# Patient Record
Sex: Female | Born: 1945 | Race: White | Hispanic: No | Marital: Married | Smoking: Never smoker
Health system: Southern US, Community
[De-identification: ages and names within clinical notes are randomized; demographics above are authoritative.]

## PROBLEM LIST (undated history)

## (undated) DIAGNOSIS — M84376A Stress fracture, unspecified foot, initial encounter for fracture: Secondary | ICD-10-CM

## (undated) DIAGNOSIS — K589 Irritable bowel syndrome without diarrhea: Secondary | ICD-10-CM

## (undated) DIAGNOSIS — M545 Low back pain, unspecified: Secondary | ICD-10-CM

## (undated) DIAGNOSIS — H269 Unspecified cataract: Secondary | ICD-10-CM

## (undated) DIAGNOSIS — I2510 Atherosclerotic heart disease of native coronary artery without angina pectoris: Secondary | ICD-10-CM

## (undated) DIAGNOSIS — D494 Neoplasm of unspecified behavior of bladder: Secondary | ICD-10-CM

## (undated) DIAGNOSIS — F329 Major depressive disorder, single episode, unspecified: Secondary | ICD-10-CM

## (undated) DIAGNOSIS — F32A Depression, unspecified: Secondary | ICD-10-CM

## (undated) DIAGNOSIS — E079 Disorder of thyroid, unspecified: Secondary | ICD-10-CM

## (undated) DIAGNOSIS — F419 Anxiety disorder, unspecified: Secondary | ICD-10-CM

## (undated) DIAGNOSIS — R0600 Dyspnea, unspecified: Secondary | ICD-10-CM

## (undated) DIAGNOSIS — N951 Menopausal and female climacteric states: Secondary | ICD-10-CM

## (undated) DIAGNOSIS — R002 Palpitations: Secondary | ICD-10-CM

## (undated) DIAGNOSIS — T7840XA Allergy, unspecified, initial encounter: Secondary | ICD-10-CM

## (undated) DIAGNOSIS — I8390 Asymptomatic varicose veins of unspecified lower extremity: Secondary | ICD-10-CM

## (undated) DIAGNOSIS — Z8719 Personal history of other diseases of the digestive system: Secondary | ICD-10-CM

## (undated) DIAGNOSIS — K219 Gastro-esophageal reflux disease without esophagitis: Secondary | ICD-10-CM

## (undated) DIAGNOSIS — K5790 Diverticulosis of intestine, part unspecified, without perforation or abscess without bleeding: Secondary | ICD-10-CM

## (undated) DIAGNOSIS — E162 Hypoglycemia, unspecified: Secondary | ICD-10-CM

## (undated) DIAGNOSIS — N6019 Diffuse cystic mastopathy of unspecified breast: Secondary | ICD-10-CM

## (undated) DIAGNOSIS — E7800 Pure hypercholesterolemia, unspecified: Secondary | ICD-10-CM

## (undated) DIAGNOSIS — T50995A Adverse effect of other drugs, medicaments and biological substances, initial encounter: Secondary | ICD-10-CM

## (undated) DIAGNOSIS — E559 Vitamin D deficiency, unspecified: Secondary | ICD-10-CM

## (undated) DIAGNOSIS — J301 Allergic rhinitis due to pollen: Secondary | ICD-10-CM

## (undated) DIAGNOSIS — IMO0002 Reserved for concepts with insufficient information to code with codable children: Secondary | ICD-10-CM

## (undated) HISTORY — DX: Diffuse cystic mastopathy of unspecified breast: N60.19

## (undated) HISTORY — DX: Stress fracture, unspecified foot, initial encounter for fracture: M84.376A

## (undated) HISTORY — DX: Disorder of thyroid, unspecified: E07.9

## (undated) HISTORY — DX: Anxiety disorder, unspecified: F41.9

## (undated) HISTORY — DX: Personal history of other diseases of the digestive system: Z87.19

## (undated) HISTORY — DX: Allergic rhinitis due to pollen: J30.1

## (undated) HISTORY — DX: Diverticulosis of intestine, part unspecified, without perforation or abscess without bleeding: K57.90

## (undated) HISTORY — DX: Depression, unspecified: F32.A

## (undated) HISTORY — PX: BLADDER SURGERY: SHX569

## (undated) HISTORY — DX: Vitamin D deficiency, unspecified: E55.9

## (undated) HISTORY — DX: Low back pain, unspecified: M54.50

## (undated) HISTORY — DX: Hypoglycemia, unspecified: E16.2

## (undated) HISTORY — DX: Neoplasm of unspecified behavior of bladder: D49.4

## (undated) HISTORY — DX: Atherosclerotic heart disease of native coronary artery without angina pectoris: I25.10

## (undated) HISTORY — DX: Pure hypercholesterolemia, unspecified: E78.00

## (undated) HISTORY — PX: CHOLECYSTECTOMY: SHX55

## (undated) HISTORY — DX: Dyspnea, unspecified: R06.00

## (undated) HISTORY — DX: Major depressive disorder, single episode, unspecified: F32.9

## (undated) HISTORY — DX: Menopausal and female climacteric states: N95.1

## (undated) HISTORY — DX: Gastro-esophageal reflux disease without esophagitis: K21.9

## (undated) HISTORY — DX: Allergy, unspecified, initial encounter: T78.40XA

## (undated) HISTORY — DX: Low back pain: M54.5

## (undated) HISTORY — DX: Asymptomatic varicose veins of unspecified lower extremity: I83.90

## (undated) HISTORY — DX: Reserved for concepts with insufficient information to code with codable children: IMO0002

## (undated) HISTORY — DX: Adverse effect of other drugs, medicaments and biological substances, initial encounter: T50.995A

## (undated) HISTORY — DX: Unspecified cataract: H26.9

## (undated) HISTORY — PX: COLONOSCOPY: SHX174

## (undated) HISTORY — DX: Palpitations: R00.2

## (undated) HISTORY — DX: Irritable bowel syndrome, unspecified: K58.9

---

## 1999-03-28 DIAGNOSIS — M84376A Stress fracture, unspecified foot, initial encounter for fracture: Secondary | ICD-10-CM

## 1999-03-28 HISTORY — DX: Stress fracture, unspecified foot, initial encounter for fracture: M84.376A

## 1999-11-29 ENCOUNTER — Encounter: Admission: RE | Admit: 1999-11-29 | Discharge: 1999-11-29 | Payer: Self-pay | Admitting: Family Medicine

## 1999-11-29 ENCOUNTER — Encounter: Payer: Self-pay | Admitting: Family Medicine

## 1999-12-07 ENCOUNTER — Encounter: Payer: Self-pay | Admitting: Family Medicine

## 1999-12-07 ENCOUNTER — Encounter: Admission: RE | Admit: 1999-12-07 | Discharge: 1999-12-07 | Payer: Self-pay | Admitting: Family Medicine

## 2000-05-31 ENCOUNTER — Encounter: Payer: Self-pay | Admitting: Family Medicine

## 2000-05-31 ENCOUNTER — Encounter: Admission: RE | Admit: 2000-05-31 | Discharge: 2000-05-31 | Payer: Self-pay | Admitting: Family Medicine

## 2001-09-24 ENCOUNTER — Other Ambulatory Visit: Admission: RE | Admit: 2001-09-24 | Discharge: 2001-09-24 | Payer: Self-pay | Admitting: Family Medicine

## 2001-10-08 ENCOUNTER — Encounter: Admission: RE | Admit: 2001-10-08 | Discharge: 2001-10-08 | Payer: Self-pay | Admitting: Family Medicine

## 2001-10-08 ENCOUNTER — Encounter: Payer: Self-pay | Admitting: Family Medicine

## 2002-03-22 LAB — HM DEXA SCAN

## 2002-09-30 LAB — HM COLONOSCOPY

## 2002-11-11 ENCOUNTER — Other Ambulatory Visit: Admission: RE | Admit: 2002-11-11 | Discharge: 2002-11-11 | Payer: Self-pay | Admitting: Family Medicine

## 2002-11-19 ENCOUNTER — Encounter: Admission: RE | Admit: 2002-11-19 | Discharge: 2002-11-19 | Payer: Self-pay | Admitting: Family Medicine

## 2002-11-19 ENCOUNTER — Encounter: Payer: Self-pay | Admitting: Family Medicine

## 2003-11-14 ENCOUNTER — Other Ambulatory Visit: Admission: RE | Admit: 2003-11-14 | Discharge: 2003-11-14 | Payer: Self-pay | Admitting: Family Medicine

## 2004-01-29 ENCOUNTER — Encounter: Admission: RE | Admit: 2004-01-29 | Discharge: 2004-01-29 | Payer: Self-pay | Admitting: Family Medicine

## 2004-05-18 DIAGNOSIS — D494 Neoplasm of unspecified behavior of bladder: Secondary | ICD-10-CM

## 2004-05-18 HISTORY — DX: Neoplasm of unspecified behavior of bladder: D49.4

## 2004-06-17 ENCOUNTER — Ambulatory Visit: Payer: Self-pay | Admitting: Urology

## 2004-07-01 ENCOUNTER — Ambulatory Visit: Payer: Self-pay | Admitting: Family Medicine

## 2004-09-23 ENCOUNTER — Ambulatory Visit: Payer: Self-pay | Admitting: Family Medicine

## 2004-09-27 ENCOUNTER — Encounter: Admission: RE | Admit: 2004-09-27 | Discharge: 2004-09-27 | Payer: Self-pay | Admitting: Family Medicine

## 2004-10-01 ENCOUNTER — Encounter: Admission: RE | Admit: 2004-10-01 | Discharge: 2004-10-01 | Payer: Self-pay | Admitting: Family Medicine

## 2004-12-15 ENCOUNTER — Ambulatory Visit: Payer: Self-pay | Admitting: Family Medicine

## 2004-12-15 ENCOUNTER — Other Ambulatory Visit: Admission: RE | Admit: 2004-12-15 | Discharge: 2004-12-15 | Payer: Self-pay | Admitting: Family Medicine

## 2004-12-31 ENCOUNTER — Ambulatory Visit: Payer: Self-pay | Admitting: Family Medicine

## 2005-01-10 ENCOUNTER — Ambulatory Visit: Payer: Self-pay | Admitting: Family Medicine

## 2005-03-03 ENCOUNTER — Ambulatory Visit: Payer: Self-pay | Admitting: Family Medicine

## 2005-06-07 ENCOUNTER — Ambulatory Visit: Payer: Self-pay | Admitting: Family Medicine

## 2005-09-01 ENCOUNTER — Ambulatory Visit: Payer: Self-pay | Admitting: Family Medicine

## 2006-03-27 ENCOUNTER — Ambulatory Visit: Payer: Self-pay | Admitting: Family Medicine

## 2006-03-28 ENCOUNTER — Encounter: Admission: RE | Admit: 2006-03-28 | Discharge: 2006-03-28 | Payer: Self-pay | Admitting: Family Medicine

## 2006-05-11 ENCOUNTER — Encounter: Admission: RE | Admit: 2006-05-11 | Discharge: 2006-05-11 | Payer: Self-pay | Admitting: Family Medicine

## 2006-07-03 ENCOUNTER — Ambulatory Visit: Payer: Self-pay | Admitting: Cardiology

## 2006-07-14 ENCOUNTER — Ambulatory Visit: Payer: Self-pay

## 2006-07-18 DIAGNOSIS — R0600 Dyspnea, unspecified: Secondary | ICD-10-CM

## 2006-07-18 HISTORY — DX: Dyspnea, unspecified: R06.00

## 2006-08-17 ENCOUNTER — Ambulatory Visit: Payer: Self-pay

## 2006-08-17 ENCOUNTER — Encounter: Payer: Self-pay | Admitting: Cardiology

## 2006-08-21 ENCOUNTER — Ambulatory Visit: Payer: Self-pay | Admitting: Cardiology

## 2006-10-11 ENCOUNTER — Ambulatory Visit: Payer: Self-pay | Admitting: Family Medicine

## 2007-05-11 ENCOUNTER — Encounter: Payer: Self-pay | Admitting: Family Medicine

## 2007-05-11 DIAGNOSIS — J301 Allergic rhinitis due to pollen: Secondary | ICD-10-CM | POA: Insufficient documentation

## 2007-05-11 DIAGNOSIS — N6019 Diffuse cystic mastopathy of unspecified breast: Secondary | ICD-10-CM | POA: Insufficient documentation

## 2007-05-11 DIAGNOSIS — N951 Menopausal and female climacteric states: Secondary | ICD-10-CM | POA: Insufficient documentation

## 2007-05-11 DIAGNOSIS — K219 Gastro-esophageal reflux disease without esophagitis: Secondary | ICD-10-CM | POA: Insufficient documentation

## 2007-05-11 DIAGNOSIS — F329 Major depressive disorder, single episode, unspecified: Secondary | ICD-10-CM | POA: Insufficient documentation

## 2007-05-14 ENCOUNTER — Ambulatory Visit: Payer: Self-pay | Admitting: Family Medicine

## 2007-05-14 DIAGNOSIS — M810 Age-related osteoporosis without current pathological fracture: Secondary | ICD-10-CM | POA: Insufficient documentation

## 2007-05-14 LAB — CONVERTED CEMR LAB
Bilirubin Urine: NEGATIVE
Nitrite: NEGATIVE
Protein, U semiquant: NEGATIVE

## 2007-05-15 ENCOUNTER — Encounter: Payer: Self-pay | Admitting: Family Medicine

## 2007-06-07 ENCOUNTER — Encounter: Payer: Self-pay | Admitting: Family Medicine

## 2007-06-07 ENCOUNTER — Encounter: Admission: RE | Admit: 2007-06-07 | Discharge: 2007-06-07 | Payer: Self-pay | Admitting: Family Medicine

## 2007-06-15 ENCOUNTER — Encounter (INDEPENDENT_AMBULATORY_CARE_PROVIDER_SITE_OTHER): Payer: Self-pay | Admitting: *Deleted

## 2007-08-15 ENCOUNTER — Ambulatory Visit: Payer: Self-pay | Admitting: Family Medicine

## 2007-08-15 DIAGNOSIS — I8390 Asymptomatic varicose veins of unspecified lower extremity: Secondary | ICD-10-CM | POA: Insufficient documentation

## 2007-08-16 ENCOUNTER — Ambulatory Visit: Payer: Self-pay | Admitting: Family Medicine

## 2007-08-17 LAB — CONVERTED CEMR LAB
ALT: 18 units/L (ref 0–35)
AST: 24 units/L (ref 0–37)
Albumin: 3.9 g/dL (ref 3.5–5.2)
Basophils Absolute: 0 10*3/uL (ref 0.0–0.1)
Calcium: 10.2 mg/dL (ref 8.4–10.5)
Chloride: 106 meq/L (ref 96–112)
Creatinine, Ser: 1 mg/dL (ref 0.4–1.2)
Eosinophils Relative: 3.6 % (ref 0.0–5.0)
H Pylori IgG: POSITIVE — AB
HCT: 41 % (ref 36.0–46.0)
MCHC: 32.6 g/dL (ref 30.0–36.0)
Neutrophils Relative %: 38.1 % — ABNORMAL LOW (ref 43.0–77.0)
RBC: 4.32 M/uL (ref 3.87–5.11)
RDW: 11.7 % (ref 11.5–14.6)
Sodium: 142 meq/L (ref 135–145)
Total Bilirubin: 0.9 mg/dL (ref 0.3–1.2)
Total CHOL/HDL Ratio: 3.2
Triglycerides: 76 mg/dL (ref 0–149)
WBC: 4.3 10*3/uL — ABNORMAL LOW (ref 4.5–10.5)

## 2007-08-27 ENCOUNTER — Telehealth: Payer: Self-pay | Admitting: Family Medicine

## 2007-08-27 ENCOUNTER — Ambulatory Visit: Payer: Self-pay | Admitting: Family Medicine

## 2007-08-28 ENCOUNTER — Telehealth (INDEPENDENT_AMBULATORY_CARE_PROVIDER_SITE_OTHER): Payer: Self-pay | Admitting: Internal Medicine

## 2007-09-04 ENCOUNTER — Telehealth (INDEPENDENT_AMBULATORY_CARE_PROVIDER_SITE_OTHER): Payer: Self-pay | Admitting: Internal Medicine

## 2007-09-07 ENCOUNTER — Encounter (INDEPENDENT_AMBULATORY_CARE_PROVIDER_SITE_OTHER): Payer: Self-pay | Admitting: Internal Medicine

## 2007-09-14 ENCOUNTER — Ambulatory Visit: Payer: Self-pay | Admitting: Family Medicine

## 2007-10-15 ENCOUNTER — Ambulatory Visit: Payer: Self-pay | Admitting: Family Medicine

## 2007-10-15 DIAGNOSIS — K589 Irritable bowel syndrome without diarrhea: Secondary | ICD-10-CM | POA: Insufficient documentation

## 2007-11-19 ENCOUNTER — Ambulatory Visit: Payer: Self-pay | Admitting: Family Medicine

## 2007-12-25 ENCOUNTER — Ambulatory Visit: Payer: Self-pay | Admitting: Family Medicine

## 2007-12-25 DIAGNOSIS — M545 Low back pain, unspecified: Secondary | ICD-10-CM | POA: Insufficient documentation

## 2007-12-30 ENCOUNTER — Emergency Department (HOSPITAL_COMMUNITY): Admission: EM | Admit: 2007-12-30 | Discharge: 2007-12-30 | Payer: Self-pay | Admitting: Emergency Medicine

## 2008-02-26 ENCOUNTER — Telehealth (INDEPENDENT_AMBULATORY_CARE_PROVIDER_SITE_OTHER): Payer: Self-pay | Admitting: *Deleted

## 2008-02-26 ENCOUNTER — Ambulatory Visit: Payer: Self-pay | Admitting: Family Medicine

## 2008-06-11 ENCOUNTER — Ambulatory Visit: Payer: Self-pay | Admitting: Family Medicine

## 2008-06-11 LAB — CONVERTED CEMR LAB
Bacteria, UA: 0
Bilirubin Urine: NEGATIVE
Epithelial cells, urine: 0 /lpf
Ketones, urine, test strip: NEGATIVE
Protein, U semiquant: NEGATIVE
RBC / HPF: 0
Urobilinogen, UA: 0.2
WBC, UA: 0 cells/hpf
pH: 6

## 2008-08-05 ENCOUNTER — Ambulatory Visit: Payer: Self-pay | Admitting: Family Medicine

## 2008-08-05 ENCOUNTER — Encounter: Admission: RE | Admit: 2008-08-05 | Discharge: 2008-08-05 | Payer: Self-pay | Admitting: Family Medicine

## 2008-08-07 ENCOUNTER — Telehealth (INDEPENDENT_AMBULATORY_CARE_PROVIDER_SITE_OTHER): Payer: Self-pay | Admitting: Internal Medicine

## 2008-08-08 ENCOUNTER — Encounter: Payer: Self-pay | Admitting: Family Medicine

## 2008-09-15 ENCOUNTER — Ambulatory Visit: Payer: Self-pay | Admitting: Internal Medicine

## 2008-09-19 ENCOUNTER — Ambulatory Visit: Payer: Self-pay | Admitting: Family Medicine

## 2008-09-26 ENCOUNTER — Other Ambulatory Visit: Admission: RE | Admit: 2008-09-26 | Discharge: 2008-09-26 | Payer: Self-pay | Admitting: Family Medicine

## 2008-09-26 ENCOUNTER — Ambulatory Visit: Payer: Self-pay | Admitting: Family Medicine

## 2008-09-26 ENCOUNTER — Encounter: Payer: Self-pay | Admitting: Family Medicine

## 2008-09-30 ENCOUNTER — Encounter: Payer: Self-pay | Admitting: Family Medicine

## 2008-09-30 LAB — CONVERTED CEMR LAB
Alkaline Phosphatase: 99 units/L (ref 39–117)
Bilirubin, Direct: 0.1 mg/dL (ref 0.0–0.3)
CO2: 32 meq/L (ref 19–32)
GFR calc Af Amer: 72 mL/min
Glucose, Bld: 96 mg/dL (ref 70–99)
Lymphocytes Relative: 45.2 % (ref 12.0–46.0)
Monocytes Absolute: 0.3 10*3/uL (ref 0.1–1.0)
Monocytes Relative: 8 % (ref 3.0–12.0)
Neutrophils Relative %: 43.6 % (ref 43.0–77.0)
Platelets: 232 10*3/uL (ref 150–400)
Potassium: 4.2 meq/L (ref 3.5–5.1)
RDW: 11.8 % (ref 11.5–14.6)
Sodium: 142 meq/L (ref 135–145)
Total CHOL/HDL Ratio: 3.4
Total Protein: 6.8 g/dL (ref 6.0–8.3)
VLDL: 13 mg/dL (ref 0–40)
Vit D, 25-Hydroxy: 19 ng/mL — ABNORMAL LOW (ref 30–89)

## 2008-10-10 ENCOUNTER — Ambulatory Visit: Payer: Self-pay | Admitting: Family Medicine

## 2008-10-10 DIAGNOSIS — E162 Hypoglycemia, unspecified: Secondary | ICD-10-CM | POA: Insufficient documentation

## 2008-10-10 DIAGNOSIS — E559 Vitamin D deficiency, unspecified: Secondary | ICD-10-CM | POA: Insufficient documentation

## 2008-10-13 ENCOUNTER — Telehealth: Payer: Self-pay | Admitting: Family Medicine

## 2008-11-21 ENCOUNTER — Ambulatory Visit: Payer: Self-pay | Admitting: Family Medicine

## 2008-11-27 ENCOUNTER — Ambulatory Visit: Payer: Self-pay | Admitting: Cardiology

## 2008-12-02 ENCOUNTER — Ambulatory Visit: Payer: Self-pay | Admitting: Cardiology

## 2008-12-09 ENCOUNTER — Ambulatory Visit: Payer: Self-pay | Admitting: Family Medicine

## 2008-12-09 LAB — CONVERTED CEMR LAB
Bacteria, UA: 0
Epithelial cells, urine: 0 /lpf
Glucose, Urine, Semiquant: NEGATIVE
Ketones, urine, test strip: NEGATIVE
Nitrite: NEGATIVE
Specific Gravity, Urine: 1.01
pH: 6

## 2009-02-11 ENCOUNTER — Encounter (INDEPENDENT_AMBULATORY_CARE_PROVIDER_SITE_OTHER): Payer: Self-pay | Admitting: *Deleted

## 2009-02-16 ENCOUNTER — Encounter (INDEPENDENT_AMBULATORY_CARE_PROVIDER_SITE_OTHER): Payer: Self-pay | Admitting: *Deleted

## 2009-02-16 ENCOUNTER — Telehealth (INDEPENDENT_AMBULATORY_CARE_PROVIDER_SITE_OTHER): Payer: Self-pay | Admitting: *Deleted

## 2009-03-06 DIAGNOSIS — R002 Palpitations: Secondary | ICD-10-CM

## 2009-03-06 HISTORY — DX: Palpitations: R00.2

## 2009-03-16 DIAGNOSIS — R002 Palpitations: Secondary | ICD-10-CM | POA: Insufficient documentation

## 2009-03-17 ENCOUNTER — Ambulatory Visit: Payer: Self-pay | Admitting: Cardiology

## 2009-04-01 ENCOUNTER — Ambulatory Visit: Payer: Self-pay | Admitting: Family Medicine

## 2009-04-03 LAB — CONVERTED CEMR LAB
ALT: 18 units/L (ref 0–35)
AST: 26 units/L (ref 0–37)
Direct LDL: 120.8 mg/dL
Total CHOL/HDL Ratio: 3

## 2009-08-26 ENCOUNTER — Encounter (INDEPENDENT_AMBULATORY_CARE_PROVIDER_SITE_OTHER): Payer: Self-pay | Admitting: *Deleted

## 2009-09-17 ENCOUNTER — Encounter: Admission: RE | Admit: 2009-09-17 | Discharge: 2009-09-17 | Payer: Self-pay | Admitting: Internal Medicine

## 2009-09-17 ENCOUNTER — Telehealth: Payer: Self-pay | Admitting: Family Medicine

## 2009-10-02 ENCOUNTER — Encounter: Admission: RE | Admit: 2009-10-02 | Discharge: 2009-10-02 | Payer: Self-pay | Admitting: Internal Medicine

## 2009-10-12 ENCOUNTER — Encounter: Admission: RE | Admit: 2009-10-12 | Discharge: 2009-10-12 | Payer: Self-pay | Admitting: Family Medicine

## 2009-10-15 ENCOUNTER — Encounter: Payer: Self-pay | Admitting: Family Medicine

## 2009-10-15 ENCOUNTER — Encounter (INDEPENDENT_AMBULATORY_CARE_PROVIDER_SITE_OTHER): Payer: Self-pay | Admitting: *Deleted

## 2009-10-16 ENCOUNTER — Ambulatory Visit: Payer: Self-pay | Admitting: Family Medicine

## 2009-10-20 LAB — CONVERTED CEMR LAB
BUN: 15 mg/dL (ref 6–23)
Basophils Relative: 0.6 % (ref 0.0–3.0)
CO2: 29 meq/L (ref 19–32)
Chloride: 103 meq/L (ref 96–112)
Cholesterol: 257 mg/dL — ABNORMAL HIGH (ref 0–200)
Creatinine, Ser: 1 mg/dL (ref 0.4–1.2)
Direct LDL: 167.6 mg/dL
Eosinophils Absolute: 0.2 10*3/uL (ref 0.0–0.7)
Lymphs Abs: 1.9 10*3/uL (ref 0.7–4.0)
MCHC: 34.2 g/dL (ref 30.0–36.0)
MCV: 95.2 fL (ref 78.0–100.0)
Monocytes Absolute: 0.4 10*3/uL (ref 0.1–1.0)
Neutrophils Relative %: 56.9 % (ref 43.0–77.0)
Platelets: 291 10*3/uL (ref 150.0–400.0)
TSH: 2.31 microintl units/mL (ref 0.35–5.50)

## 2009-10-21 ENCOUNTER — Encounter: Payer: Self-pay | Admitting: Family Medicine

## 2009-11-20 ENCOUNTER — Encounter: Admission: RE | Admit: 2009-11-20 | Discharge: 2009-11-20 | Payer: Self-pay | Admitting: Family Medicine

## 2009-11-23 ENCOUNTER — Encounter (INDEPENDENT_AMBULATORY_CARE_PROVIDER_SITE_OTHER): Payer: Self-pay | Admitting: *Deleted

## 2010-01-04 ENCOUNTER — Ambulatory Visit: Payer: Self-pay | Admitting: Family Medicine

## 2010-01-29 ENCOUNTER — Telehealth: Payer: Self-pay | Admitting: Family Medicine

## 2010-06-22 ENCOUNTER — Telehealth: Payer: Self-pay | Admitting: Gastroenterology

## 2010-06-23 ENCOUNTER — Encounter (INDEPENDENT_AMBULATORY_CARE_PROVIDER_SITE_OTHER): Payer: Self-pay | Admitting: *Deleted

## 2010-07-27 ENCOUNTER — Ambulatory Visit: Admit: 2010-07-27 | Payer: Self-pay | Admitting: Gastroenterology

## 2010-08-08 ENCOUNTER — Encounter: Payer: Self-pay | Admitting: Family Medicine

## 2010-08-18 ENCOUNTER — Encounter (INDEPENDENT_AMBULATORY_CARE_PROVIDER_SITE_OTHER): Payer: Self-pay | Admitting: *Deleted

## 2010-08-19 ENCOUNTER — Encounter: Payer: Self-pay | Admitting: Gastroenterology

## 2010-08-19 NOTE — Progress Notes (Signed)
Summary: Schedule Recall Colonoscopy   Phone Note Outgoing Call Call back at [facility] Phone [phone]   Call placed by: Harlow Mares CMA Duncan Dull),  June 22, 2010 1:08 PM Call placed to: Patient Summary of Call: Left a message on patients machine to call back. patient needs to schedule her colonoscopy due to family hx of colon cancer.  Initial call taken by: Harlow Mares CMA Duncan Dull),  June 22, 2010 1:09 PM  Follow-up for Phone Call        colonoscopy scheduled for 08/10/2010 Follow-up by: Harlow Mares CMA Duncan Dull),  July 14, 2010 9:37 AM

## 2010-08-19 NOTE — Assessment & Plan Note (Signed)
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- EKG and vitals were ok  went to [place] to see Dr Toni Arthurs -- and got x rays of neck -- started on ibuprofen  dx with arthritis in neck  1 wk of nsaid helped - but still had head and neck pain did then have MRI- with protrusion of disc in neck saw neurosurg yesterday -- DR Dutch Quint -- treating consv with exercises and then will f/u  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 * Prevpack 2)  ! * Prevpac--Probably Amoxicillin 3)  ! Prilosec 4)  Nsaids 5)  Sudafed 6)  Codeine  Past History:  Family History: Last updated: 11/01/09 Father: deceased- colon cancer.  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   Risk Factors: Smoking Status: never (08/15/2007)  Past Medical History: Depression Current Problems:  CAD (ICD-414.00) PALPITATIONS (ICD-785.1) VITAMIN D DEFICIENCY (ICD-268.9) HYPOGLYCEMIA, UNSPECIFIED (ICD-251.2) DIZZINESS (ICD-780.4) ROUTINE GYNECOLOGICAL EXAMINATION (ICD-V72.31) BACK PAIN, LUMBAR (ICD-724.2) IRRITABLE BOWEL SYNDROME (ICD-564.1) UNS ADVRS EFF OTH RX MEDICINAL&BIOLOGICAL SBSTNC (ICD-995.29) VARICOSE VEINS, LOWER 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.9) HEALTH MAINTENANCE EXAM (ICD-V70.0) OSTEOPENIA (ICD-733.90) HYPERCHOLESTEROLEMIA (ICD-272.0) Hx of FIBROCYSTIC BREAST DISEASE (ICD-610.1) ALLERGIC RHINITIS, SEASONAL (ICD-477.0) POSTMENOPAUSAL STATUS (ICD-627.2) GERD (ICD-530.81) DEPRESSION (ICD-311) Evaluation for Dyspnea 2008 with negative echo and MV scan Palpitations 02/2009 evaluated with event monitor 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 Eyes:  Denies blurring and eye irritation. CV:  Denies chest pain or discomfort, lightheadness, palpitations, and shortness of breath with exertion. Resp:  Denies cough, shortness of breath, and wheezing. GI:  Denies abdominal pain, bloody stools, change in bowel habits, indigestion, nausea, and vomiting. GU:  Denies abnormal vaginal bleeding, discharge, dysuria, and urinary frequency. MS:  Complains of joint pain and stiffness; denies joint redness, joint swelling, cramps, and muscle weakness. Derm:  Denies itching, lesion(s), poor wound healing, and rash. Neuro:  Denies numbness and tingling. Psych:  mood is ok . Endo:  Denies cold intolerance, excessive thirst, excessive urination, and heat intolerance. Heme:  Denies abnormal bruising and bleeding.  Physical Exam  General:  Well-developed,well-nourished,in no acute distress; alert,appropriate and cooperative throughout examination Head:  normocephalic, atraumatic, and no abnormalities observed.   Eyes:  vision grossly intact, pupils equal, pupils round, and pupils reactive to light.  no conjunctival pallor, injection or icterus  Ears:  R ear normal and L ear normal.   Nose:  no nasal discharge.   Mouth:  pharynx pink and moist.   Neck:  supple with full rom and no masses or thyromegally, no JVD or carotid bruit  Chest Wall:  No deformities, masses, or tenderness noted. Breasts:  No mass, nodules, thickening, tenderness, bulging, retraction, inflamation, nipple discharge or skin changes noted.   Lungs:  Normal respiratory effort, chest expands symmetrically. Lungs are clear to auscultation, no  crackles or wheezes. Heart:  Normal rate and regular rhythm. S1 and S2 normal without gallop, murmur, click, rub or other extra sounds. Abdomen:  Bowel sounds positive,abdomen soft and non-tender without masses, organomegaly or hernias noted. no renal bruits  Msk:  No deformity or scoliosis noted of thoracic or lumbar spine.  some cervical tenderness with fairly nl rom  Pulses:  R and L carotid,radial,femoral,dorsalis pedis and posterior tibial pulses are full and equal bilaterally Extremities:  No clubbing, cyanosis, edema, or deformity noted with normal full range of motion of all joints.   Neurologic:  sensation intact to light touch, gait normal, and DTRs symmetrical and normal.   Skin:  Intact without suspicious lesions or rashes lentigos diffusely some SKs Cervical Nodes:  No lymphadenopathy noted Axillary Nodes:  No palpable lymphadenopathy Inguinal Nodes:  No significant adenopathy Psych:  normal affect, talkative and pleasant    Impression & Recommendations:  Problem # 1:  HEALTH MAINTENANCE EXAM (ICD-V70.0) Assessment Comment Only reviewed health habits including diet, exercise and skin cancer prevention reviewed health maintenance list and family history lab today Orders: Venipuncture ([address]) TLB-Lipid Panel (80061-LIPID) TLB-BMP (Basic Metabolic Panel-BMET) (80048-METABOL) TLB-CBC Platelet -  w/Differential (85025-CBCD) TLB-TSH (Thyroid Stimulating Hormone) (84443-TSH) T-Vitamin D (25-Hydroxy) ([address]40-98119)  Problem # 2:  OSTEOPENIA (ICD-733.90) Assessment: Unchanged is due for dexa- will schedule that  rev ca and D and checking D level  disc imp of wt bearing exercise  Her updated medication list for this problem includes:    Calcium 600/vitamin D 600-400 Mg-unit Tabs (Calcium carbonate-vitamin d) .Marland Kitchen... 1 tablet daily by mouth  Orders: Venipuncture ([address]) TLB-Lipid Panel (80061-LIPID) TLB-BMP (Basic Metabolic Panel-BMET) (80048-METABOL) TLB-CBC Platelet -  w/Differential (85025-CBCD) TLB-TSH (Thyroid Stimulating Hormone) (84443-TSH) T-Vitamin D (25-Hydroxy) (95621-30865) Radiology Referral (Radiology)  Problem # 3:  VITAMIN D DEFICIENCY (ICD-268.9) Assessment: Unchanged check level  pt unsure how much she is taking at this time -  but was low in past disc imp of this to bone health  Orders: Venipuncture (78469) TLB-Lipid Panel (80061-LIPID) TLB-BMP (Basic Metabolic Panel-BMET) (80048-METABOL) TLB-CBC Platelet - w/Differential (85025-CBCD) TLB-TSH (Thyroid Stimulating Hormone) (84443-TSH) T-Vitamin D (25-Hydroxy) (62952-84132) Specimen Handling ([address])  Problem # 4:  HYPERCHOLESTEROLEMIA (ICD-272.0) Assessment: Deteriorated  pt states diet is not optimal  expect lipids to be up today  disc imp of lipid control to cardiovasc health she so far refuses med of any kind for this -- also has disc with her cardiologist  Orders: Venipuncture ([address]) TLB-Lipid Panel (80061-LIPID) TLB-BMP (Basic Metabolic Panel-BMET) (80048-METABOL) TLB-CBC Platelet - w/Differential (85025-CBCD) TLB-TSH (Thyroid Stimulating Hormone) (84443-TSH) T-Vitamin D (25-Hydroxy) (66440-34742)  Labs Reviewed: SGOT: 26 (04/01/2009)   SGPT: 18 (04/01/2009)   HDL:69.50 (04/01/2009), 73.1 (09/26/2008)  LDL:DEL (09/26/2008), DEL (08/16/2007)  Chol:212 (04/01/2009), 249 (09/26/2008)  Trig:46.0 (04/01/2009), 63 (09/26/2008)  Complete Medication List: 1)  Fexofenadine Hcl 60 Mg Tabs (Fexofenadine hcl) .Marland Kitchen.. 1 by mouth two times a day as needed 2)  Zantac 150 Mg Tabs (Ranitidine hcl) .Marland Kitchen.. 1 by mouth two times a day 3)  Calcium 600/vitamin D 600-400 Mg-unit Tabs (Calcium carbonate-vitamin d) .Marland Kitchen.. 1 tablet daily by mouth 4)  Nasonex 50 Mcg/act Susp (Mometasone furoate) .... 2 sprays in each nostril once daily as needed 5)  Ambien 5 Mg Tabs (Zolpidem tartrate) .... One tab by mouth at bedtime as needed for sleep 6)  Methocarbamol 500 Mg Tabs (Methocarbamol) .... Take one  tablet by mouth three times a day  Patient Instructions: 1)  don't forget to schedule your colonoscopy- let us know if you need a referral  2)  we will do bone density referral at check out  3)  the current recommendation for calcium intake is 1200-1500 mg daily with1000 IU of vitamin D  4)  labs today including vitamin D level  5)  work on healthy diet for cholesterol  Prescriptions: NASONEX 50 MCG/ACT SUSP (MOMETASONE FUROATE) 2 sprays in each nostril once daily as needed  #3 mdi x 3   Entered and Authorized by:   Judith Part MD   Signed by:   Judith Part MD on 10/16/2009   Method used:   [facility] to        [facility] (retail)       [address]       [place], [place]  [address]       Ph: [phone] or [phone]       Fax: [phone]   RxID:   [id number] ZANTAC 150 MG  TABS (RANITIDINE HCL) 1 by mouth two times a day  #180 x 3   Entered and Authorized by:   Judith Part MD   Signed by:   Colon Flattery  Tower MD on 10/16/2009   Method used:   [facility]        [facility] (retail)       [address]       [place], [place]  [address]       Ph: [phone] or [phone]       Fax: [phone]   RxID:   [id number] FEXOFENADINE HCL 60 MG TABS (FEXOFENADINE HCL) 1 by mouth two times a day as needed  #180 x 3   Entered and Authorized by:   Judith Part MD   Signed by:   Judith Part MD on 10/16/2009   Method used:   [facility]        [facility] ([facility]ail)       [address]       [place], [place]  [address]       Ph: [phone] or [phone]       Fax: [phone]   RxID:   [id number]   Current Allergies (reviewed today): ! * PREVPACK ! * PREVPAC--PROBABLY AMOXICILLIN ! PRILOSEC NSAIDS SUDAFED CODEINE

## 2010-08-19 NOTE — Letter (Signed)
Summary: Colonoscopy Letter  [facility]  [address] [place], [place] [address]   Phone: [phone]  Fax: [phone]      August 26, 2009 MRN: [id number]   CARLENE BICKLEY [address] [place], [place]  [address]   Dear Ms. Sabas,   According to your medical record, it is time for you to schedule a Colonoscopy. The American Cancer Society recommends this procedure as a method to detect early colon cancer. Patients with a family history of colon cancer, or a personal history of colon polyps or inflammatory bowel disease are at increased risk.  This letter has beeen generated based on the recommendations made at the time of your procedure. If you feel that in your particular situation this may no longer apply, please contact our office.  Please call our office at [phone] to schedule this appointment or to update your records at your earliest convenience.  Thank you for cooperating with [place] to provide you with the very best care possible.   Sincerely,  Judie Petit T. Russella Dar, M.D.  [facility] Gastroenterology Division [phone]

## 2010-08-19 NOTE — Letter (Signed)
Summary: Pre Visit Letter Revised  [facility]  [address] [place], [place] [address]   Phone: [phone]  Fax: [phone]        06/23/2010 MRN: [id number] Diane Delacruz [address] [place], [place]  [address]             Procedure Date:  08/10/2010  Welcome to the Gastroenterology Division at [facility].    You are scheduled to see a nurse for your pre-procedure visit on 07/27/2010 at 10:00AM on the 3rd floor at [facility], [address]  We ask that you try to arrive at our office 15 minutes prior to your appointment time to allow for check-in.  Please take a minute to review the attached form.  If you answer "Yes" to one or more of the questions on the first page, we ask that you call the person listed at your earliest opportunity.  If you answer "No" to all of the questions, please complete the rest of the form and bring it to your appointment.    Your nurse visit will consist of discussing your medical and surgical history, your immediate family medical history, and your medications.   If you are unable to list all of your medications on the form, please bring the medication bottles to your appointment and we will list them.  We will need to be aware of both prescribed and over the counter drugs.  We will need to know exact dosage information as well.    Please be prepared to read and sign documents such as consent forms, a financial agreement, and acknowledgement forms.  If necessary, and with your consent, a friend or relative is welcome to sit-in on the nurse visit with you.  Please bring your insurance card so that we may make a copy of it.  If your insurance requires a referral to see a specialist, please bring your referral form from your primary care physician.  No co-pay is required for this nurse visit.     If you cannot keep your appointment, please call [phone] to cancel or reschedule prior to your appointment date.  This allows  [place] the opportunity to schedule an appointment for another patient in need of care.    Thank you for choosing [facility] for your medical needs.  We appreciate the opportunity to care for you.  Please visit [place] at our website  to learn more about our practice.  Sincerely, The Gastroenterology Division

## 2010-08-19 NOTE — Miscellaneous (Signed)
Summary: Vitamin D 5000iu OTC update  Medications Added * VITAMIN D 5000IU Take 1 tablet by mouth once a day       Clinical Lists Changes  Medications: Added new medication of * VITAMIN D 5000IU Take 1 tablet by mouth once a day     Current Allergies: ! * PREVPACK ! * PREVPAC--PROBABLY AMOXICILLIN ! PRILOSEC NSAIDS SUDAFED CODEINE

## 2010-08-19 NOTE — Letter (Signed)
Summary: Results Follow up Letter  [place] at [facility]  [address] [place], [place] [address]   Phone: [phone]  Fax: [phone]    10/15/2009 MRN: [id number]     Diane Delacruz [address] [place], [place]  [address]    Dear [name],  The following are the results of your recent test(s):  Test         Result    Pap Smear:        Normal _____  Not Normal _____ Comments: ______________________________________________________ Cholesterol: LDL(Bad cholesterol):         Your goal is less than:         HDL (Good cholesterol):       Your goal is more than: Comments:  ______________________________________________________ Mammogram:        Normal __x___  Not Normal _____ Comments:Repeat in 1 year  ___________________________________________________________________ Hemoccult:        Normal _____  Not normal _______ Comments:    _____________________________________________________________________ Other Tests:    We routinely do not discuss normal results over the telephone.  If you desire a copy of the results, or you have any questions about this information we can discuss them at your next office visit.   Sincerely,  Roxy Manns MD

## 2010-08-19 NOTE — Progress Notes (Signed)
Summary: hand feeling numb  Phone Note Call from Patient   Caller: Patient Call For: Judith Part MD Summary of Call: Patient says that yesterday her hand started feeling numb while she was at work. 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 She says that she feels like her hand is asleep.She wants to be seen today, can we add her or can it wait until tomorrow. Please advise. Initial call taken by: Melody Comas,  September 17, 2009 9:15 AM  Follow-up for Phone Call        if any numbness on side of body or difficulty with speech/ headache -- ie signs of stroke- go to ER if just hand - carpal tunnel is possible  if no avail appts today put her in for tomorrow-- but update [place] if symptoms worsen if she does not want to wait I recommend [facility]  Follow-up by: Judith Part MD,  September 17, 2009 9:34 AM  Additional Follow-up for Phone Call Additional follow up Details #1::        Patient Advised.  Additional Follow-up by: Delilah Shan CMA (AAMA),  September 17, 2009 10:22 AM

## 2010-08-19 NOTE — Letter (Signed)
Summary: Results Follow up Letter  [place] at [facility]  [address] [place], [place] [address]   Phone: [phone]  Fax: [phone]    11/23/2009 MRN: [id number]    Diane Delacruz [address] [place], [place]  [address]    Dear [name],  The following are the results of your recent test(s):  Test         Result    Pap Smear:        Normal _____  Not Normal _____ Comments: ______________________________________________________ Cholesterol: LDL(Bad cholesterol):         Your goal is less than:         HDL (Good cholesterol):       Your goal is more than: Comments:  ______________________________________________________ Mammogram:        Normal _____  Not Normal _____ Comments:  ___________________________________________________________________ Hemoccult:        Normal _____  Not normal _______ Comments:    _____________________________________________________________________ Other Tests:   Bone Density:  Dexa scan shows very mild osteopenia -- so take Calcium with Vitamin  D and exercise.   we will plan to re check this in about 2 years.  We routinely do not discuss normal results over the telephone.  If you desire a copy of the results, or you have any questions about this information we can discuss them at your next office visit.   Sincerely,   Marne A. Milinda Antis, M.D.  MAT:lsf

## 2010-08-19 NOTE — Progress Notes (Signed)
Summary: pt is concerned about MRSA  Phone Note Call from Patient Call back at Home Phone [phone]   Caller: Patient Call For: Judith Part MD Summary of Call: Pt states her mother was recently diagnosed with UTI, the bacteria was MRSA.  Pt is asking if she should be concerned, she says she did change her mother's diaper a couple of times. Initial call taken by: Lowella Petties CMA,  January 29, 2010 8:37 AM  Follow-up for Phone Call        please send for note take abx and f/u as planned  wash hands carefully before and after personal care of herself and others  lysol to cleanse surfaces at home  update if fever or worsening symptoms Follow-up by: Judith Part MD,  January 29, 2010 8:40 AM  Additional Follow-up for Phone Call Additional follow up Details #1::        The pt is not the one dx with MRSA. Pt's mother is in a nursing home and recently pt's mother  had UTI and was diagnosed with MRSA in the urine. The pt helps change her mother's diaper and change her mother's bed. Pt does wash her hands and use hand sanitizer when she is helping her mother at the nursing home. Pt is also going to start to wear gloves if she helps her mother with the bathroom or changes her diaper. Pt wants to know if she should be tested for MRSA or should the pt be worried about contracting MRSA.Please advise. Lewanda Rife LPN  January 29, 2010 9:06 AM     Additional Follow-up for Phone Call Additional follow up Details #2::    she is doing best she can with hygiene there is nothing further to do  please call if she develops any symptoms like skin sores or urinary symptoms Follow-up by: Judith Part MD,  January 29, 2010 9:07 AM  Additional Follow-up for Phone Call Additional follow up Details #3:: Details for Additional Follow-up Action Taken: Patient notified as instructed by telephone. Lewanda Rife LPN  January 29, 2010 9:20 AM

## 2010-08-20 NOTE — Consult Note (Signed)
Summary: [facility]   Imported By: Lanelle Bal 10/30/2009 13:13:17  _____________________________________________________________________  External Attachment:    Type:   Image     Comment:   External Document

## 2010-08-23 ENCOUNTER — Telehealth: Payer: Self-pay | Admitting: Family Medicine

## 2010-08-25 NOTE — Letter (Signed)
Summary: [facility] Instructions  [facility]  [address] [place], [place] [address]   Phone: [phone]  Fax: [phone]       Diane Delacruz    10-13-1945    MRN: [id number]        Procedure Day /Date:  Monday 08/30/2010     Arrival Time: 8:00 am     Procedure Time: 9:00 am     Location of Procedure:                    _x _  [facility] (4th Floor)                        PREPARATION FOR COLONOSCOPY WITH MOVIPREP   Starting 5 days prior to your procedure Wednesday 2/8 do not eat nuts, seeds, popcorn, corn, beans, peas,  salads, or any raw vegetables.  Do not take any fiber supplements (e.g. Metamucil, Citrucel, and Benefiber).  THE DAY BEFORE YOUR PROCEDURE         DATE: Sunday 2/12  1.  Drink clear liquids the entire day-NO SOLID FOOD  2.  Do not drink anything colored red or purple.  Avoid juices with pulp.  No orange juice.  3.  Drink at least 64 oz. (8 glasses) of fluid/clear liquids during the day to prevent dehydration and help the prep work efficiently.  CLEAR LIQUIDS INCLUDE: Water Jello Ice Popsicles Tea (sugar ok, no milk/cream) Powdered fruit flavored drinks Coffee (sugar ok, no milk/cream) Gatorade Juice: apple, white grape, white cranberry  Lemonade Clear bullion, consomm, broth Carbonated beverages (any kind) Strained chicken noodle soup Hard Candy                             4.  In the morning, mix first dose of MoviPrep solution:    Empty 1 Pouch A and 1 Pouch B into the disposable container    Add lukewarm drinking water to the top line of the container. Mix to dissolve    Refrigerate (mixed solution should be used within 24 hrs)  5.  Begin drinking the prep at 5:00 p.m. The MoviPrep container is divided by 4 marks.   Every 15 minutes drink the solution down to the next mark (approximately 8 oz) until the full liter is complete.   6.  Follow completed prep with 16 oz of clear liquid of your choice (Nothing  red or purple).  Continue to drink clear liquids until bedtime.  7.  Before going to bed, mix second dose of MoviPrep solution:    Empty 1 Pouch A and 1 Pouch B into the disposable container    Add lukewarm drinking water to the top line of the container. Mix to dissolve    Refrigerate  THE DAY OF YOUR PROCEDURE      DATE: Monday 2/13  Beginning at 4:00 a.m. (5 hours before procedure):         1. Every 15 minutes, drink the solution down to the next mark (approx 8 oz) until the full liter is complete.  2. Follow completed prep with 16 oz. of clear liquid of your choice.    3. You may drink clear liquids until 7:00 am (2 HOURS BEFORE PROCEDURE).   MEDICATION INSTRUCTIONS  Unless otherwise instructed, you should take regular prescription medications with a small sip of water   as early as possible the morning of your  procedure.         OTHER INSTRUCTIONS  You will need a responsible adult at least 65 years of age to accompany you and drive you home.   This person must remain in the waiting room during your procedure.  Wear loose fitting clothing that is easily removed.  Leave jewelry and other valuables at home.  However, you may wish to bring a book to read or  an iPod/MP3 player to listen to music as you wait for your procedure to start.  Remove all body piercing jewelry and leave at home.  Total time from sign-in until discharge is approximately 2-3 hours.  You should go home directly after your procedure and rest.  You can resume normal activities the  day after your procedure.  The day of your procedure you should not:   Drive   Make legal decisions   Operate machinery   Drink alcohol   Return to work  You will receive specific instructions about eating, activities and medications before you leave.    The above instructions have been reviewed and explained to me by   Karl Bales RN  August 19, 2010 9:14 AM    I fully understand and can  verbalize these instructions _____________________________ Date _________

## 2010-08-25 NOTE — Miscellaneous (Signed)
Summary: [facility] previsit  Clinical Lists Changes  Medications: Added new medication of MOVIPREP 100 GM  SOLR (PEG-KCL-NACL-NASULF-NA ASC-C) As per prep instructions. - Signed Rx of MOVIPREP 100 GM  SOLR (PEG-KCL-NACL-NASULF-NA ASC-C) As per prep instructions.;  #1 x 0;  Signed;  Entered by: Karl Bales RN;  Authorized by: Meryl Dare MD [facility];  Method used: [facility]  [address] [place], [place]  [address], Ph: [phone] or [phone], Fax: [phone]    Prescriptions: MOVIPREP 100 GM  SOLR (PEG-KCL-NACL-NASULF-NA ASC-C) As per prep instructions.  #1 x 0   Entered by:   Karl Bales RN   Authorized by:   Meryl Dare MD [facility]   Signed by:   Karl Bales RN on 08/19/2010   Method used:   [facility]        [facility]  [address] (retail)       [address]       [place], [place]  [address]       Ph: [phone] or [phone]       Fax: [phone]   RxID:   [id number]

## 2010-08-27 ENCOUNTER — Telehealth: Payer: Self-pay | Admitting: Family Medicine

## 2010-08-30 ENCOUNTER — Other Ambulatory Visit: Payer: Self-pay | Admitting: Gastroenterology

## 2010-09-02 NOTE — Progress Notes (Signed)
  Phone Note Call from Patient   Caller: Patient Summary of Call: Called patient and told her the Drs office in [facility] and [place] that perform Colonoscopys in their offices as well as [facility] who do them in [place] one day a week. When I called her back to check if she called them yet she said she did not want to schedule at this time. She said she has a CPX in April with Dr Milinda Antis and she would discuss it with her at  that time again.  Initial call taken by: Carlton Adam,  August 27, 2010 10:49 AM  Follow-up for Phone Call        ok- thanks Follow-up by: Judith Part MD,  August 27, 2010 11:11 AM

## 2010-09-02 NOTE — Progress Notes (Signed)
Summary: needs referral   Phone Note Call from Patient   Caller: Patient Call For: Judith Part MD Summary of Call: Patient is due for her colonoscopy, but she can't go to Dr. Creola Corn for this because they do it the ambulitory out patient and she needs to go some where that does in office due to her ins. Patient prefers going to [facility].  Initial call taken by: Melody Comas,  August 23, 2010 3:26 PM  Follow-up for Phone Call        I will do ref for [facility] Follow-up by: Judith Part MD,  August 23, 2010 3:44 PM  New Problems: SCREENING, COLON CANCER (ICD-V76.51) NEOPLASM, MALIGNANT, COLON, FAMILY HX, FATHER (ICD-V16.0)   New Problems: SCREENING, COLON CANCER (ICD-V76.51) NEOPLASM, MALIGNANT, COLON, FAMILY HX, FATHER (ICD-V16.0)

## 2010-09-18 ENCOUNTER — Encounter: Payer: Self-pay | Admitting: Family Medicine

## 2010-10-27 ENCOUNTER — Telehealth: Payer: Self-pay | Admitting: Family Medicine

## 2010-10-27 DIAGNOSIS — E559 Vitamin D deficiency, unspecified: Secondary | ICD-10-CM

## 2010-10-27 DIAGNOSIS — Z0000 Encounter for general adult medical examination without abnormal findings: Secondary | ICD-10-CM

## 2010-10-27 DIAGNOSIS — M899 Disorder of bone, unspecified: Secondary | ICD-10-CM

## 2010-10-27 NOTE — Telephone Encounter (Signed)
Message copied by Roxy Manns on Wed Oct 27, 2010 10:20 AM ------      Message from: Margarite Gouge, NATASHA      Created: Wed Oct 27, 2010  7:25 AM      Regarding: CPX labs thurs       Please order  future cpx labs for pt's upcomming lab appt.      Thanks      Rodney Booze

## 2010-10-28 ENCOUNTER — Other Ambulatory Visit: Payer: BC Managed Care – PPO

## 2010-10-29 ENCOUNTER — Other Ambulatory Visit (INDEPENDENT_AMBULATORY_CARE_PROVIDER_SITE_OTHER): Payer: BC Managed Care – PPO

## 2010-10-29 DIAGNOSIS — M899 Disorder of bone, unspecified: Secondary | ICD-10-CM

## 2010-10-29 DIAGNOSIS — Z0000 Encounter for general adult medical examination without abnormal findings: Secondary | ICD-10-CM

## 2010-10-29 DIAGNOSIS — E559 Vitamin D deficiency, unspecified: Secondary | ICD-10-CM

## 2010-10-29 LAB — CBC WITH DIFFERENTIAL/PLATELET
Basophils Absolute: 0 10*3/uL (ref 0.0–0.1)
Basophils Relative: 0.6 % (ref 0.0–3.0)
Eosinophils Relative: 3.9 % (ref 0.0–5.0)
HCT: 37.6 % (ref 36.0–46.0)
Hemoglobin: 12.9 g/dL (ref 12.0–15.0)
Lymphocytes Relative: 51.5 % — ABNORMAL HIGH (ref 12.0–46.0)
Lymphs Abs: 2.4 10*3/uL (ref 0.7–4.0)
Monocytes Relative: 8.1 % (ref 3.0–12.0)
Neutro Abs: 1.7 10*3/uL (ref 1.4–7.7)
RBC: 3.99 Mil/uL (ref 3.87–5.11)
RDW: 12.9 % (ref 11.5–14.6)

## 2010-10-29 LAB — COMPREHENSIVE METABOLIC PANEL
ALT: 14 U/L (ref 0–35)
BUN: 10 mg/dL (ref 6–23)
CO2: 28 mEq/L (ref 19–32)
Calcium: 9.5 mg/dL (ref 8.4–10.5)
Chloride: 103 mEq/L (ref 96–112)
Creatinine, Ser: 0.9 mg/dL (ref 0.4–1.2)
GFR: 65.24 mL/min (ref >60.00)
Glucose, Bld: 86 mg/dL (ref 70–99)
Total Bilirubin: 0.8 mg/dL (ref 0.3–1.2)

## 2010-10-29 LAB — LIPID PANEL
HDL: 66.6 mg/dL (ref >39.00)
Triglycerides: 70 mg/dL (ref 0.0–149.0)

## 2010-10-29 LAB — TSH: TSH: 2.99 u[IU]/mL (ref 0.35–5.50)

## 2010-10-30 LAB — VITAMIN D 25 HYDROXY (VIT D DEFICIENCY, FRACTURES): Vit D, 25-Hydroxy: 61 ng/mL (ref 30–89)

## 2010-11-01 ENCOUNTER — Other Ambulatory Visit: Payer: Self-pay | Admitting: Family Medicine

## 2010-11-01 ENCOUNTER — Encounter: Payer: Self-pay | Admitting: Family Medicine

## 2010-11-01 ENCOUNTER — Ambulatory Visit (INDEPENDENT_AMBULATORY_CARE_PROVIDER_SITE_OTHER): Payer: BC Managed Care – PPO | Admitting: Family Medicine

## 2010-11-01 VITALS — BP 90/62 | HR 80 | Temp 97.8°F | Ht 62.5 in | Wt 141.0 lb

## 2010-11-01 DIAGNOSIS — M899 Disorder of bone, unspecified: Secondary | ICD-10-CM

## 2010-11-01 DIAGNOSIS — Z1231 Encounter for screening mammogram for malignant neoplasm of breast: Secondary | ICD-10-CM

## 2010-11-01 DIAGNOSIS — M949 Disorder of cartilage, unspecified: Secondary | ICD-10-CM

## 2010-11-01 DIAGNOSIS — F329 Major depressive disorder, single episode, unspecified: Secondary | ICD-10-CM

## 2010-11-01 DIAGNOSIS — Z780 Asymptomatic menopausal state: Secondary | ICD-10-CM

## 2010-11-01 DIAGNOSIS — Z800 Family history of malignant neoplasm of digestive organs: Secondary | ICD-10-CM

## 2010-11-01 DIAGNOSIS — E785 Hyperlipidemia, unspecified: Secondary | ICD-10-CM

## 2010-11-01 DIAGNOSIS — E7800 Pure hypercholesterolemia, unspecified: Secondary | ICD-10-CM

## 2010-11-01 DIAGNOSIS — F3289 Other specified depressive episodes: Secondary | ICD-10-CM

## 2010-11-01 DIAGNOSIS — Z0000 Encounter for general adult medical examination without abnormal findings: Secondary | ICD-10-CM

## 2010-11-01 DIAGNOSIS — R42 Dizziness and giddiness: Secondary | ICD-10-CM

## 2010-11-01 MED ORDER — RANITIDINE HCL 150 MG PO CAPS: 150.0000 mg | ORAL_CAPSULE | Freq: Two times a day (BID) | ORAL | Status: DC

## 2010-11-01 MED ORDER — MOMETASONE FUROATE 50 MCG/ACT NA SUSP: 2.0000 | Freq: Every day | NASAL | Status: DC

## 2010-11-01 NOTE — Assessment & Plan Note (Signed)
Worse with anx and current grief ? Role in dizziness and malaise Continue f/u with counselor and continue current meds F/u 1 mo

## 2010-11-01 NOTE — Progress Notes (Signed)
Subjective:    Patient ID: Diane Delacruz, female    DOB: 09-20-1945, 65 y.o.   MRN: [id number]  HPI Here for health mt exam and to rev chronic health problems  Has had a rough year  Not feeling real well   Stress-mother moved in with her a year ago- then she was hosp and went to [facility] - then passed away in 06/27/2023  A real roller coaster --is off schedule and trying to get back to normal  She was 65 years old   Wt is stable bp great 90/62  Zoster status- never had the disease  Is interested   Pap 3/10 Never had abn paps or hyst  No new partners or symptoms   Mam 3/11 -- was normal  Self exam -no  Lumps or problems   colonosc 3/04-- is overdue for 5 year follow up  Now her insurance has changed and they will not cover at Dr Ardell Isaacs office  It has to be in an office environment  Father and aunt had colon cancer   Td 04   LDL chol is 153 down from 914 Diet off- not wanted to eat - no appetite  Not eating healthy foods Lab Results  Component Value Date   CHOL 251* 10/29/2010   CHOL 257* 10/16/2009   CHOL 212* 04/01/2009   Lab Results  Component Value Date   HDL 66.60 10/29/2010   HDL 78.29 10/16/2009   HDL 69.50 04/01/2009   No results found for this basename: [facility]   Lab Results  Component Value Date   TRIG 70.0 10/29/2010   TRIG 93.0 10/16/2009   TRIG 46.0 04/01/2009   Lab Results  Component Value Date   CHOLHDL 4 10/29/2010   CHOLHDL 3 10/16/2009   CHOLHDL 3 04/01/2009      Has been dizzy lately - ? If related to fatigue / low bp or stress  Comes and goes - especially when sitting up   dexa 03 osteopenia -- last one with [facility]  Needs update Takes ca and vit D No broken bones Needs more exercise   Past Medical History  Diagnosis Date  . Depression   . CAD (coronary artery disease)   . Palpitations 03/06/09    event monitor  . Unspecified vitamin D deficiency   . Hypoglycemia, unspecified   . Dizziness and giddiness   . Lumbago   .  Irritable bowel syndrome   . Unspecified adverse effect of other drug, medicinal and biological substance   . Asymptomatic varicose veins   . Disorder of bone and cartilage, unspecified   . Pure hypercholesterolemia   . Diffuse cystic mastopathy   . Allergic rhinitis due to pollen   . Symptomatic menopausal or female climacteric states   . Esophageal reflux   . Dyspnea 2008    negative echo and MV scan  . DDD (degenerative disc disease)     in neck, neurosurgeon Dr. Dutch Quint  . Stress fracture of foot 03/28/99    left  . Bladder tumor 11/05    Past Surgical History  Procedure Date  . Cholecystectomy     gallstones- ultrasound 10/03    History   Social History  . Marital Status: Married    Spouse Name: N/A    Number of Children: 1  . Years of Education: N/A   Occupational History  . cashier    Social History Main Topics  . Smoking status: Never Smoker   . Smokeless tobacco: Not  on file  . Alcohol Use: Not on file  . Drug Use: Not on file  . Sexually Active: Not on file   Other Topics Concern  . Not on file   Social History Narrative   Takes care of elderly motherRegular exercise    Family History  Problem Relation Age of Onset  . Hyperlipidemia Mother   . Stroke Mother   . Arthritis Mother   . Macular degeneration Mother   . Cancer Father     colon  . Heart disease Father     MI's  . COPD Sister     heavy smoker  . Cancer Maternal Aunt     colon  . Cancer Paternal Aunt     breast  . Osteopenia Sister        Review of Systems  Constitutional: Positive for appetite change and fatigue. Negative for chills.  HENT: Negative for sore throat and rhinorrhea.   Eyes: Negative for pain.  Respiratory: Negative for chest tightness, shortness of breath and wheezing.   Gastrointestinal: Negative for nausea, abdominal pain, diarrhea, constipation and blood in stool.  Genitourinary: Negative for dysuria, urgency, frequency, vaginal bleeding and pelvic pain.    Musculoskeletal: Negative for back pain and joint swelling.  Skin: Negative for color change, pallor, rash and wound.  Neurological: Negative for tremors, weakness, light-headedness, numbness and headaches.  Hematological: Does not bruise/bleed easily.  Psychiatric/Behavioral: Positive for dysphoric mood. Negative for suicidal ideas, confusion and decreased concentration. The patient is nervous/anxious.        Objective:   Physical Exam  Constitutional: She appears well-developed and well-nourished.  HENT:  Head: Normocephalic and atraumatic.  Right Ear: External ear normal.  Left Ear: External ear normal.  Nose: Nose normal.  Mouth/Throat: Oropharynx is clear and moist.  Eyes: Conjunctivae and EOM are normal. Pupils are equal, round, and reactive to light.  Neck: Normal range of motion. Neck supple. No JVD present. Carotid bruit is not present. No thyromegaly present.  Cardiovascular: Normal rate, regular rhythm and normal heart sounds.   No murmur heard. Pulmonary/Chest: Effort normal. She has no wheezes.  Abdominal: Soft. Bowel sounds are normal. She exhibits no mass. There is no tenderness.  Genitourinary: No breast swelling, tenderness, discharge or bleeding.  Musculoskeletal: Normal range of motion. She exhibits no edema and no tenderness.  Lymphadenopathy:    She has no cervical adenopathy.  Neurological: She is alert. She has normal reflexes. No cranial nerve deficit. Coordination normal.  Skin: Skin is warm and dry. No rash noted. No erythema. No pallor.  Psychiatric:       Somewhat down today - seems sad and fatigued           Assessment & Plan:

## 2010-11-01 NOTE — Assessment & Plan Note (Signed)
Yearly mam sched and breast exam done- nl  Enc self exams

## 2010-11-01 NOTE — Assessment & Plan Note (Signed)
Reviewed health habits including diet and exercise and skin cancer prevention Also reviewed health mt list, fam hx and immunizations   Rev wellness labs  

## 2010-11-01 NOTE — Patient Instructions (Addendum)
Check with your insurance company about shingles vaccine and then let us know if you want one  We will schedule mammogram and dexa at check out We will work on colonoscopy ref at check out  Avoid red meat/ fried foods/ egg yolks/ fatty breakfast meats/ butter, cheese and high fat dairy/ and shellfish   Also try to eat regular meals  Continue counseling  If dizziness worsens - call  Schedule lab in 1 month and then follow up to discuss this and also some other issues

## 2010-11-01 NOTE — Assessment & Plan Note (Signed)
sched dexa  Disc ca and D-- on 2000 iu per day  Disc exercise

## 2010-11-01 NOTE — Assessment & Plan Note (Signed)
Scheduled colonosc- overdue for 5 y check

## 2010-11-01 NOTE — Assessment & Plan Note (Signed)
New and vague May be multifactorial - with some low bp along with anxiety symptoms and fatigue No direct signs of vertigo Will update if worsens  F/u 1 mo to disc further

## 2010-11-01 NOTE — Assessment & Plan Note (Signed)
Disc need for better control  Rev low sat fat diet in detail  Re check 1 mo and f/u

## 2010-11-05 ENCOUNTER — Ambulatory Visit: Payer: BC Managed Care – PPO

## 2010-11-15 ENCOUNTER — Ambulatory Visit
Admission: RE | Admit: 2010-11-15 | Discharge: 2010-11-15 | Disposition: A | Discharge status: Home / Self Care | Payer: BLUE CROSS/BLUE SHIELD | Source: Ambulatory Visit | Attending: Family Medicine | Admitting: Family Medicine

## 2010-11-15 DIAGNOSIS — Z1231 Encounter for screening mammogram for malignant neoplasm of breast: Secondary | ICD-10-CM

## 2010-11-23 ENCOUNTER — Encounter: Payer: Self-pay | Admitting: *Deleted

## 2010-11-29 ENCOUNTER — Other Ambulatory Visit: Payer: BC Managed Care – PPO

## 2010-12-01 ENCOUNTER — Other Ambulatory Visit (INDEPENDENT_AMBULATORY_CARE_PROVIDER_SITE_OTHER): Payer: BC Managed Care – PPO | Admitting: Family Medicine

## 2010-12-01 DIAGNOSIS — E785 Hyperlipidemia, unspecified: Secondary | ICD-10-CM

## 2010-12-01 LAB — LIPID PANEL
HDL: 63.1 mg/dL (ref >39.00)
Triglycerides: 77 mg/dL (ref 0.0–149.0)

## 2010-12-01 LAB — LDL CHOLESTEROL, DIRECT: Direct LDL: 137 mg/dL

## 2010-12-03 ENCOUNTER — Ambulatory Visit: Payer: BC Managed Care – PPO | Admitting: Family Medicine

## 2010-12-03 NOTE — Assessment & Plan Note (Signed)
[facility]                            CARDIOLOGY OFFICE NOTE   Diane, Delacruz                      MRN:          [id number]  DATE:08/21/2006                            DOB:          [birth year]    PRIMARY CARE PHYSICIAN:  Dr. Roxy Manns.   CLINICAL COURSE:  Diane Delacruz was recently here for evaluation of  shortness of breath that has been present over the past 6-7 months. We  evaluated her with a Myoview scan which was negative for ischemia and  with an echocardiogram which showed normal LV function, normal left  ventricular wall thickness and some mild thickening of the aortic valve.  She did have some mild left atrial enlargement. We also did blood work  including a CBC, BNP and TSH which were normal.   She returned today for a followup of her evaluation and a decision  regarding any further evaluation. She says her symptoms are about the  same. She has no history of any trips that would suggest pulmonary  embolus.   PAST MEDICAL HISTORY:  Significant for hyperlipidemia and she has an LDL  of 153 and an HDL of 65.   CURRENT MEDICATIONS:  Prilosec, Allegra, and Calcitrate.   FAMILY HISTORY:  Positive for coronary artery disease.   PHYSICAL EXAMINATION:  VITAL SIGNS:  Blood pressure is 104/64 and the  pulse 77 and regular.  NECK:  There was no vein distention. Carotid pulses were full without  bruits.  CHEST:  Clear without rales or rhonchi.  HEART:  Rhythm was regular. The heart sounds were normal. There were no  murmurs or gallops.  ABDOMEN:  Soft with normal bowel sounds.  EXTREMITIES:  Peripheral pulses were full and there was no peripheral  edema.   IMPRESSION:  1. Dyspnea on exertion of uncertain etiology. No evidence for      cardiovascular cause.  2. Hyperlipidemia.  3. Positive family history of chronic heart disease.   RECOMMENDATIONS:  Diane Delacruz workup has been negative with  essentially a normal echo and normal  Myoview scan. Will plan to get a D-  dimer to screen for pulmonary embolus although there is nothing in the  history to suggest this and will also get a BNP to suggest for any  volume overloads since she had mild left atrial enlargement on echo. I  told her that we could not find any cardiac source for her symptoms and  that some of these may be related to deconditioning and I suggested that  she start on an exercise program. She has a [employer] program near her home  that she says that she could pursue. Her risk profile is not high enough  to initiate statins for primary prevention but we did give instruction  on dietary measures to lower her cholesterol. She is to call if she is  not progressing in the exercise program and her dyspnea is her limiting  factor.     Bruce Elvera Lennox Juanda Chance, MD, [facility]  Electronically Signed    BRB/MedQ  DD: 08/21/2006  DT: 08/21/2006  Job #: [id number]  cc:   Marne A. Milinda Antis, MD

## 2010-12-03 NOTE — Assessment & Plan Note (Signed)
[facility]                            CARDIOLOGY OFFICE NOTE   DEVORAH, GIVHAN                      MRN:          [id number]  DATE:07/03/2006                            DOB:          11-17-1945    CONSULTATION   DATE OF CONSULTATION:  July 03, 2006.   REFERRING PHYSICIAN:  Primary care physician:  Idamae Schuller A. Tower, MD   REASON FOR CONSULTATION:  Evaluation of shortness of breath.   CLINICAL HISTORY:  Mrs. Olshefski is 65 years old and has no prior  history of known heart disease.  She has had symptoms of shortness of  breath, primarily with exertion for several months.  She notices if she  walks or hurry and when she walks with her friends she gets short of  breath much more easily than she did before.  She sometimes gets some  associated chest tightness with this.  She also occasionally has  symptoms of shortness of breath at rest and these most often occur at  night.  She has had no recent symptoms of palpitations.   She had been seen by Dr. Andee Lineman in 1991 for symptoms of chest pain and  palpitations and at that time had a Holter monitor and a stress  echocardiogram which were negative.   PAST MEDICAL HISTORY:  Her past medical history is significant for  previous gallbladder and bladder surgeries.  She also has an elevated  cholesterol which was measured recently and her total cholesterol was  245 and LDL was 153, HDL was 65, triglycerides 38.  There is no history  of diabetes or hypertension.  She had a history of bronchitis several  years ago, but otherwise has no prior history of lung disease.   SOCIAL HISTORY:  She works as a Conservation officer, nature.  She is married. She has one  grown daughter who lives in [place].  She does not drink or smoke.   FAMILY HISTORY:  Her father died at 49 with colon cancer but had  previous heart attacks.  Her mother, three brothers and one sister had  no history of heart disease.  Her father's brother had two  sons who died  at a very young age of heart attacks at age 9 and 85.   REVIEW OF SYSTEMS:  Positive for reflux symptoms and allergy symptoms.   PHYSICAL EXAMINATION:  VITAL SIGNS:  On physical examination the blood  pressure was 99/66 and pulse 68 and regular.  NECK:  There was no venous distention.  The carotid pulses were full  without bruits.  CHEST:  Clear.  There were no rales or rhonchi.  CARDIAC:  Rhythm was regular.  I could hear no murmurs, clicks or  gallops. The first and second heart sounds were normal.  ABDOMEN:  Soft.  I could feel no hepatosplenomegaly. Normal bowel  sounds.  There were no pulsatile masses.  EXTREMITIES:  Peripheral pulses were equal and there was no peripheral  edema.  MUSCULOSKELETAL:  System showed no deformities.  SKIN:  Warm and dry.  NEUROLOGICAL:  No focal neurological signs.   CLINICAL DATA:  An electrocardiogram was normal.   IMPRESSION:  1. Shortness of breath of uncertain etiology.  2. Hyperlipidemia.  3. Positive family history for coronary heart disease.   RECOMMENDATIONS:  I am not certain regarding the etiology of Ms.  Golubski's shortness of breath.  I am most concerned this could  represent ischemic equivalent because of her positive family history of  coronary heart disease and because of her elevated cholesterol.  Will  plan to evaluate her with a chest x-ray, get oxygen saturation and will  do a rest stress Myoview scan.  I will be in touch with you after we  have the results of these tests and will decide about further  evaluation.     Bruce Elvera Lennox Juanda Chance, MD, [facility]  Electronically Signed    BRB/MedQ  DD: 07/03/2006  DT: 07/03/2006  Job #: [id number]   cc:   Marne A. Milinda Antis, MD

## 2010-12-07 ENCOUNTER — Other Ambulatory Visit: Payer: Self-pay | Admitting: Internal Medicine

## 2010-12-07 DIAGNOSIS — R519 Headache, unspecified: Secondary | ICD-10-CM

## 2010-12-08 ENCOUNTER — Ambulatory Visit (INDEPENDENT_AMBULATORY_CARE_PROVIDER_SITE_OTHER): Payer: BC Managed Care – PPO | Admitting: Family Medicine

## 2010-12-08 ENCOUNTER — Encounter: Payer: Self-pay | Admitting: Family Medicine

## 2010-12-08 DIAGNOSIS — F329 Major depressive disorder, single episode, unspecified: Secondary | ICD-10-CM

## 2010-12-08 DIAGNOSIS — E785 Hyperlipidemia, unspecified: Secondary | ICD-10-CM

## 2010-12-08 DIAGNOSIS — R42 Dizziness and giddiness: Secondary | ICD-10-CM

## 2010-12-08 NOTE — Progress Notes (Signed)
Subjective:    Patient ID: Diane Delacruz, female    DOB: November 08, 1945, 65 y.o.   MRN: [id number]  HPI Here for f/u of lipids and dizziness/ malaise (as well as depression)  Last lipids LDL in 150s-- now LDL in 130s  According to her daughter -- she is not eating much  Is choosing better foods  Asked to work on diet Lab Results  Component Value Date   CHOL 213* 12/01/2010   CHOL 251* 10/29/2010   CHOL 257* 10/16/2009   Lab Results  Component Value Date   HDL 63.10 12/01/2010   HDL 91.47 10/29/2010   HDL 82.95 10/16/2009   No results found for this basename: [facility]   Lab Results  Component Value Date   TRIG 77.0 12/01/2010   TRIG 70.0 10/29/2010   TRIG 93.0 10/16/2009   Lab Results  Component Value Date   CHOLHDL 3 12/01/2010   CHOLHDL 4 10/29/2010   CHOLHDL 3 10/16/2009   Lab Results  Component Value Date   LDLDIRECT 137.0 12/01/2010   LDLDIRECT 153.5 10/29/2010   LDLDIRECT 167.6 10/16/2009    Depression-- NP -- primary care when she could not get in to see me Darl Pikes) When her mother was ill  Started her on prozac -- and that did not work  Then cymbalta and klonopin   Thinks her depression is somewhat improved but not that much  She sleeps a lot  Wants to sleep all day  No exercise  Appetite is very poor  Anxiety component is improved   Saw Darl Pikes yesterday and told to go off of klonopin (? Was making her dizzy) ---was taking every day instead of as needed  Will wean off of it  Was taking .5 twice daily  And wanted her to have sleep study and cardiol consult and MRI of the brain   Also tracking blood pressure at home  bp is running low at home 80s/50 (? If this is from klonopin)   Then seeing counselor -- Alycia Rossetti (with hospice grief program ) and going into group therapy in June  For grief Is helpful   Past Medical History  Diagnosis Date  . Depression   . CAD (coronary artery disease)   . Palpitations 03/06/09    event monitor  . Unspecified vitamin D deficiency   .  Hypoglycemia, unspecified   . Dizziness and giddiness   . Lumbago   . Irritable bowel syndrome   . Unspecified adverse effect of other drug, medicinal and biological substance   . Asymptomatic varicose veins   . Disorder of bone and cartilage, unspecified   . Pure hypercholesterolemia   . Diffuse cystic mastopathy   . Allergic rhinitis due to pollen   . Symptomatic menopausal or female climacteric states   . Esophageal reflux   . Dyspnea 2008    negative echo and MV scan  . DDD (degenerative disc disease)     in neck, neurosurgeon Dr. Dutch Quint  . Stress fracture of foot 03/28/99    left  . Bladder tumor 11/05    Family History  Problem Relation Age of Onset  . Hyperlipidemia Mother   . Stroke Mother   . Arthritis Mother   . Macular degeneration Mother   . Cancer Father     colon  . Heart disease Father     MI's  . COPD Sister     heavy smoker  . Cancer Maternal Aunt     colon  . Cancer Paternal Aunt  breast  . Osteopenia Sister     History   Social History  . Marital Status: Married    Spouse Name: N/A    Number of Children: 1  . Years of Education: N/A   Occupational History  . cashier    Social History Main Topics  . Smoking status: Never Smoker   . Smokeless tobacco: Not on file  . Alcohol Use: Not on file  . Drug Use: Not on file  . Sexually Active: Not on file   Other Topics Concern  . Not on file   Social History Narrative   Takes care of elderly motherRegular exercise     Review of Systems Review of Systems  Constitutional: Negative for fever, appetite change,  and unexpected weight change. pos for fatigue and dismotivation Eyes: Negative for pain and visual disturbance.  Respiratory: Negative for cough and shortness of breath.   Cardiovascular: Negative.for cp or palpitations or edema    Gastrointestinal: Negative for nausea, diarrhea and constipation. no abd pain  Genitourinary: Negative for urgency and frequency.  Skin: Negative for  pallor. or rash  Neurological: Negative for weakness, , numbness and headaches. pos for dizziness that can be positional  Hematological: Negative for adenopathy. Does not bruise/bleed easily.  Psychiatric/Behavioral: pos for symptoms of depression and anxiety , no panic attacks, no SI         Objective:   Physical Exam  Constitutional: She appears well-developed and well-nourished. No distress.       Fatigued appearing Here with supportive family member  HENT:  Head: Normocephalic and atraumatic.  Mouth/Throat: Oropharynx is clear and moist.  Eyes: Conjunctivae and EOM are normal. Pupils are equal, round, and reactive to light.       No nystagmus   Neck: Normal range of motion. Neck supple. No JVD present. No thyromegaly present.  Cardiovascular: Normal rate, regular rhythm, normal heart sounds and intact distal pulses.   Pulmonary/Chest: Effort normal and breath sounds normal. No respiratory distress. She has no wheezes.  Abdominal: Soft. Bowel sounds are normal. She exhibits no distension and no mass. There is no tenderness.  Musculoskeletal: She exhibits no edema and no tenderness.  Lymphadenopathy:    She has no cervical adenopathy.  Neurological: She is alert. She has normal reflexes. No cranial nerve deficit. Coordination normal.  Skin: Skin is warm and dry.  Psychiatric:       Psychomotor slowing consistent with depression and fatigue  Eye contact fair Good comm skills Speaks freely of stressors           Assessment & Plan:

## 2010-12-08 NOTE — Assessment & Plan Note (Signed)
Grief rxn  A bit better with celexa Stopping klonopin for sleepiness and dizzinesss and low bp  F/u 2 wk May need to consider psych ref Continue counseling - ind and group

## 2010-12-08 NOTE — Patient Instructions (Addendum)
Cholesterol is better- good job with that  Keep a food journal and think about scheduling meals Come off the klonopin as planned  Let me know if you get worse (mood / dizziness) Follow up in 2 weeks

## 2010-12-08 NOTE — Assessment & Plan Note (Signed)
Improving with diet Disc low sat fat Doing better

## 2010-12-08 NOTE — Assessment & Plan Note (Signed)
With sleepiness May be from klonopin - ? celexa Will wean off klonopin Re eval 2 weeks  Update if worse

## 2010-12-11 ENCOUNTER — Other Ambulatory Visit: Payer: BC Managed Care – PPO

## 2010-12-15 ENCOUNTER — Other Ambulatory Visit: Payer: BC Managed Care – PPO

## 2010-12-22 ENCOUNTER — Ambulatory Visit (INDEPENDENT_AMBULATORY_CARE_PROVIDER_SITE_OTHER): Payer: BC Managed Care – PPO | Admitting: Family Medicine

## 2010-12-22 ENCOUNTER — Encounter: Payer: Self-pay | Admitting: Family Medicine

## 2010-12-22 DIAGNOSIS — R42 Dizziness and giddiness: Secondary | ICD-10-CM

## 2010-12-22 DIAGNOSIS — F3289 Other specified depressive episodes: Secondary | ICD-10-CM

## 2010-12-22 DIAGNOSIS — F329 Major depressive disorder, single episode, unspecified: Secondary | ICD-10-CM

## 2010-12-22 NOTE — Progress Notes (Signed)
Subjective:    Patient ID: Diane Delacruz, female    DOB: 12-31-1945, 65 y.o.   MRN: [id number]  HPI Here for f/u of depression / dizziness/ grief  Wt down 3 lb with bmi of 25  Counseling-- did not go when daughter was here  Goes back next week  Group counseling later   Last visit stopped klonopin- sleepy and dizzy Tapering it off still  Less dizzy and less sleepy  Is down to 1/2 pill every 4 days  Could stop it now   Appetite is not great - but eats meals on a schedule  Is down 3 lbs  Is getting a lot more activity  Some more motivation- also pushing herself a bit   On cymbalta  -- thinks it is helping some  Is definitely brighter - especially in the past few days "feels so much better"  Not sleeping well  Has Remus Loffler - and it still takes her until 1-2 am to sleep - does not like it  Tries not to take it unless she has to  Not active enough during the day  Patient Active Problem List  Diagnoses  . HYPOGLYCEMIA, UNSPECIFIED  . VITAMIN D DEFICIENCY  . HYPERCHOLESTEROLEMIA  . DEPRESSION  . VARICOSE VEINS, LOWER EXTREMITIES  . ALLERGIC RHINITIS, SEASONAL  . GERD  . IRRITABLE BOWEL SYNDROME  . FIBROCYSTIC BREAST DISEASE  . POSTMENOPAUSAL STATUS  . BACK PAIN, LUMBAR  . OSTEOPENIA  . PALPITATIONS  . Routine general medical examination at a health care facility  . Other screening mammogram  . Post-menopausal  . Family history of colon cancer  . Hyperlipidemia  . Dizziness   Past Medical History  Diagnosis Date  . Depression   . CAD (coronary artery disease)   . Palpitations 03/06/09    event monitor  . Unspecified vitamin D deficiency   . Hypoglycemia, unspecified   . Dizziness and giddiness   . Lumbago   . Irritable bowel syndrome   . Unspecified adverse effect of other drug, medicinal and biological substance   . Asymptomatic varicose veins   . Disorder of bone and cartilage, unspecified   . Pure hypercholesterolemia   . Diffuse cystic mastopathy   .  Allergic rhinitis due to pollen   . Symptomatic menopausal or female climacteric states   . Esophageal reflux   . Dyspnea 2008    negative echo and MV scan  . DDD (degenerative disc disease)     in neck, neurosurgeon Dr. Dutch Quint  . Stress fracture of foot 03/28/99    left  . Bladder tumor 11/05   Past Surgical History  Procedure Date  . Cholecystectomy     gallstones- ultrasound 10/03   History  Substance Use Topics  . Smoking status: Never Smoker   . Smokeless tobacco: Not on file  . Alcohol Use: Not on file   Family History  Problem Relation Age of Onset  . Hyperlipidemia Mother   . Stroke Mother   . Arthritis Mother   . Macular degeneration Mother   . Cancer Father     colon  . Heart disease Father     MI's  . COPD Sister     heavy smoker  . Cancer Maternal Aunt     colon  . Cancer Paternal Aunt     breast  . Osteopenia Sister    Allergies  Allergen Reactions  . Codeine     REACTION: unknown reaction  . Klonopin (Clonazepam)  Dizziness , fatigue  . Nsaids     REACTION: diarrhea  . Omeprazole     REACTION: did not work for her  . Pseudoephedrine     REACTION: jittery   Current Outpatient Prescriptions on File Prior to Visit  Medication Sig Dispense Refill  . Calcium Carbonate-Vitamin D (CALCIUM 600-D) 600-400 MG-UNIT per tablet Take 1 tablet by mouth daily.        . Cholecalciferol (VITAMIN D) 2000 UNITS CAPS 1 capsule by mouth daily       . DULoxetine (CYMBALTA) 60 MG capsule Take 60 mg by mouth daily.        . mometasone (NASONEX) 50 MCG/ACT nasal spray 2 sprays by Nasal route daily.  17 g  11  . ranitidine (ZANTAC) 150 MG capsule Take 1 capsule (150 mg total) by mouth 2 (two) times daily.  60 capsule  11  . fexofenadine (ALLEGRA) 60 MG tablet Take 60 mg by mouth 2 (two) times daily.        Marland Kitchen zolpidem (AMBIEN) 5 MG tablet Take 5 mg by mouth at bedtime as needed.             Review of Systems Review of Systems  Constitutional: Negative for fever,  appetite change, fatigue and unexpected weight change.  Eyes: Negative for pain and visual disturbance.  Respiratory: Negative for cough and shortness of breath.   Cardiovascular: Negative.for cp or palpitations   Gastrointestinal: Negative for nausea, diarrhea and constipation.  Genitourinary: Negative for urgency and frequency.  Skin: Negative for pallor.  Neurological: Negative for weakness, light-headedness, numbness and headaches.  Hematological: Negative for adenopathy. Does not bruise/bleed easily.  Psychiatric/Behavioral: pos for depression (improved) and problems sleeping          Objective:   Physical Exam  Constitutional: She appears well-developed and well-nourished. No distress.  HENT:  Head: Normocephalic and atraumatic.  Mouth/Throat: Oropharynx is clear and moist.  Eyes: Conjunctivae and EOM are normal. Pupils are equal, round, and reactive to light.  Neck: Normal range of motion. Neck supple. No JVD present. No thyromegaly present.  Cardiovascular: Normal rate, regular rhythm, normal heart sounds and intact distal pulses.   Pulmonary/Chest: Effort normal and breath sounds normal. No respiratory distress.  Lymphadenopathy:    She has no cervical adenopathy.  Neurological: She is alert. She has normal reflexes. Coordination normal.  Skin: Skin is warm and dry. No pallor.  Psychiatric:       Affect much improved Brighter- smiling at times Not tearful  Good eye contact and communication skills            Assessment & Plan:

## 2010-12-22 NOTE — Patient Instructions (Signed)
Try to get some regular exercise - work on 30 min per day  Try melatonin over the counter for sleep  Get outdoors too Return to counseling Stop the klonopin Continue cymbalta at present dose  Follow up in 1-2 months

## 2010-12-22 NOTE — Assessment & Plan Note (Signed)
Gradually improving with cymbalta 60 mg Will continue this Urged to return to counseling Has good support Disc exercise F/u 1-2 mo - earlier if symptoms worsen  Is able to get off klonopin now

## 2010-12-22 NOTE — Assessment & Plan Note (Signed)
Resolved with wean of klonopin Will completley stop today Update if symptoms return

## 2011-02-03 ENCOUNTER — Ambulatory Visit (INDEPENDENT_AMBULATORY_CARE_PROVIDER_SITE_OTHER): Payer: BC Managed Care – PPO | Admitting: Family Medicine

## 2011-02-03 ENCOUNTER — Encounter: Payer: Self-pay | Admitting: Family Medicine

## 2011-02-03 VITALS — BP 96/72 | HR 70 | Temp 97.5°F | Wt 137.2 lb

## 2011-02-03 DIAGNOSIS — R300 Dysuria: Secondary | ICD-10-CM

## 2011-02-03 LAB — POCT URINALYSIS DIPSTICK
Glucose, UA: NEGATIVE
Nitrite, UA: NEGATIVE
Urobilinogen, UA: NEGATIVE
pH, UA: 5

## 2011-02-03 MED ORDER — CIPROFLOXACIN HCL 500 MG PO TABS: ORAL_TABLET | ORAL | Status: AC

## 2011-02-03 NOTE — Progress Notes (Signed)
SUBJECTIVE: Diane Delacruz is a 65 y.o. female who complains of urinary frequency, urgency and dysuria x 14 days, without flank pain, fever, chills, or abnormal vaginal discharge or bleeding.   She has a h/o IBS and has had some lower abdominal cramping with stools but does not think it's related.  Patient Active Problem List  Diagnoses  . HYPOGLYCEMIA, UNSPECIFIED  . VITAMIN D DEFICIENCY  . HYPERCHOLESTEROLEMIA  . DEPRESSION  . VARICOSE VEINS, LOWER EXTREMITIES  . ALLERGIC RHINITIS, SEASONAL  . GERD  . IRRITABLE BOWEL SYNDROME  . FIBROCYSTIC BREAST DISEASE  . POSTMENOPAUSAL STATUS  . BACK PAIN, LUMBAR  . OSTEOPENIA  . PALPITATIONS  . Routine general medical examination at a health care facility  . Other screening mammogram  . Post-menopausal  . Family history of colon cancer  . Hyperlipidemia  . Dizziness   Past Medical History  Diagnosis Date  . Depression   . CAD (coronary artery disease)   . Palpitations 03/06/09    event monitor  . Unspecified vitamin D deficiency   . Hypoglycemia, unspecified   . Dizziness and giddiness   . Lumbago   . Irritable bowel syndrome   . Unspecified adverse effect of other drug, medicinal and biological substance   . Asymptomatic varicose veins   . Disorder of bone and cartilage, unspecified   . Pure hypercholesterolemia   . Diffuse cystic mastopathy   . Allergic rhinitis due to pollen   . Symptomatic menopausal or female climacteric states   . Esophageal reflux   . Dyspnea 2008    negative echo and MV scan  . DDD (degenerative disc disease)     in neck, neurosurgeon Dr. Dutch Quint  . Stress fracture of foot 03/28/99    left  . Bladder tumor 11/05   Past Surgical History  Procedure Date  . Cholecystectomy     gallstones- ultrasound 10/03   History  Substance Use Topics  . Smoking status: Never Smoker   . Smokeless tobacco: Not on file  . Alcohol Use: Not on file   Family History  Problem Relation Age of Onset  .  Hyperlipidemia Mother   . Stroke Mother   . Arthritis Mother   . Macular degeneration Mother   . Cancer Father     colon  . Heart disease Father     MI's  . COPD Sister     heavy smoker  . Cancer Maternal Aunt     colon  . Cancer Paternal Aunt     breast  . Osteopenia Sister    Allergies  Allergen Reactions  . Codeine     REACTION: unknown reaction  . Klonopin (Clonazepam)     Dizziness , fatigue  . Nsaids     REACTION: diarrhea  . Omeprazole     REACTION: did not work for her  . Pseudoephedrine     REACTION: jittery   Current Outpatient Prescriptions on File Prior to Visit  Medication Sig Dispense Refill  . Calcium Carbonate-Vitamin D (CALCIUM 600-D) 600-400 MG-UNIT per tablet Take 1 tablet by mouth daily.        . Cholecalciferol (VITAMIN D) 2000 UNITS CAPS 1 capsule by mouth daily       . DULoxetine (CYMBALTA) 60 MG capsule Take 60 mg by mouth daily.        . fexofenadine (ALLEGRA) 60 MG tablet Take 60 mg by mouth 2 (two) times daily.        . mometasone (NASONEX) 50 MCG/ACT nasal  spray 2 sprays by Nasal route daily.  17 g  11  . ranitidine (ZANTAC) 150 MG capsule Take 1 capsule (150 mg total) by mouth 2 (two) times daily.  60 capsule  11  . zolpidem (AMBIEN) 5 MG tablet Take 5 mg by mouth at bedtime as needed.         The PMH, PSH, Social History, Family History, Medications, and allergies have been reviewed in [facility], and have been updated if relevant.  OBJECTIVE: LMP 07/19/1995 BP 96/72  Pulse 70  Temp(Src) 97.5 F (36.4 C) (Oral)  Wt 137 lb 4 oz (62.256 kg)  LMP 07/19/1995   Appears well, in no apparent distress.  Vital signs are normal. The abdomen is soft without tenderness, guarding, mass, rebound or organomegaly. No CVA tenderness or inguinal adenopathy noted. Urine dipstick shows positive for RBC's and positive for protein.    ASSESSMENT: UTI uncomplicated without evidence of pyelonephritis  PLAN: Treatment per orders - cipro 500 mg twice daily x 5 days,  also push fluids, may use Pyridium OTC prn. Call or return to clinic prn if these symptoms worsen or fail to improve as anticipated.

## 2011-02-21 ENCOUNTER — Ambulatory Visit (INDEPENDENT_AMBULATORY_CARE_PROVIDER_SITE_OTHER): Payer: BC Managed Care – PPO | Admitting: Family Medicine

## 2011-02-21 ENCOUNTER — Encounter: Payer: Self-pay | Admitting: Family Medicine

## 2011-02-21 DIAGNOSIS — F329 Major depressive disorder, single episode, unspecified: Secondary | ICD-10-CM

## 2011-02-21 DIAGNOSIS — R55 Syncope and collapse: Secondary | ICD-10-CM | POA: Insufficient documentation

## 2011-02-21 DIAGNOSIS — G4700 Insomnia, unspecified: Secondary | ICD-10-CM | POA: Insufficient documentation

## 2011-02-21 MED ORDER — DULOXETINE HCL 30 MG PO CPEP: 30.0000 mg | ORAL_CAPSULE | Freq: Every day | ORAL | Status: DC

## 2011-02-21 MED ORDER — ZALEPLON 5 MG PO CAPS: 5.0000 mg | ORAL_CAPSULE | Freq: Every day | ORAL | Status: DC

## 2011-02-21 NOTE — Assessment & Plan Note (Signed)
Overall improved with cymbalta and counseling A little fuzzy - so she wants to cut dose Eventually wants to transition to prozac  Will dec to 30 mg cymbalta- disc poss side eff F/u 2 mo  Continue counseling

## 2011-02-21 NOTE — Assessment & Plan Note (Signed)
Without dizziness/ vasovagal symptoms or seizure activity Actually thinks she may have just fallen asleep on the floor suspicous of ambien and mildly low bp as etiol Nl EKG today with rate 72/ low voltages and no acute changes

## 2011-02-21 NOTE — Assessment & Plan Note (Signed)
Suspect Diane Delacruz is too long acting= re: episode of sleepin on floor Trial of sonata (shorter acting) low dose and update

## 2011-02-21 NOTE — Progress Notes (Signed)
Subjective:    Patient ID: Diane Delacruz, female    DOB: 1945/11/28, 65 y.o.   MRN: [id number]  HPI Here for f/u of depression with grief  Last visit was starting to improve on cymbalta  Overall much better  Started back to work  Less depression   Had uti 3 weeks ago and was treated with abx (saw Dr Dayton Martes)  Had episode of "floaty feeling"  Then had episode of syncope -- on carpet- went down slowly and caught herself  Then she fell asleep - ? How long out but on the floor 30 minutes  Never fainted in the past ? Why she fainted  Has low bp overall  Felt really tired overall / not dizzy  No bitten tounge or wet pants  Ever since - feels generally tired -- head feels funny and hard to focus  Did not hit her head   Sleep- still not sleeping well unless she takes [place]  Reacted poorly to benadryl -- wakes up with it  Also klonopin - does not tolerate  Did try melatonin and it did not help   Counseling  Is in individual now- will be in group  This has been greatly helpful for her grief and sadness  Patient Active Problem List  Diagnoses  . HYPOGLYCEMIA, UNSPECIFIED  . VITAMIN D DEFICIENCY  . HYPERCHOLESTEROLEMIA  . DEPRESSION  . VARICOSE VEINS, LOWER EXTREMITIES  . ALLERGIC RHINITIS, SEASONAL  . GERD  . IRRITABLE BOWEL SYNDROME  . FIBROCYSTIC BREAST DISEASE  . POSTMENOPAUSAL STATUS  . BACK PAIN, LUMBAR  . OSTEOPENIA  . PALPITATIONS  . Routine general medical examination at a health care facility  . Other screening mammogram  . Post-menopausal  . Family history of colon cancer  . Hyperlipidemia  . Syncope  . Insomnia   Past Medical History  Diagnosis Date  . Depression   . CAD (coronary artery disease)   . Palpitations 03/06/09    event monitor  . Unspecified vitamin D deficiency   . Hypoglycemia, unspecified   . Dizziness and giddiness   . Lumbago   . Irritable bowel syndrome   . Unspecified adverse effect of other drug, medicinal and biological substance    . Asymptomatic varicose veins   . Disorder of bone and cartilage, unspecified   . Pure hypercholesterolemia   . Diffuse cystic mastopathy   . Allergic rhinitis due to pollen   . Symptomatic menopausal or female climacteric states   . Esophageal reflux   . Dyspnea 2008    negative echo and MV scan  . DDD (degenerative disc disease)     in neck, neurosurgeon Dr. Dutch Quint  . Stress fracture of foot 03/28/99    left  . Bladder tumor 11/05   Past Surgical History  Procedure Date  . Cholecystectomy     gallstones- ultrasound 10/03   History  Substance Use Topics  . Smoking status: Never Smoker   . Smokeless tobacco: Not on file  . Alcohol Use: Not on file   Family History  Problem Relation Age of Onset  . Hyperlipidemia Mother   . Stroke Mother   . Arthritis Mother   . Macular degeneration Mother   . Cancer Father     colon  . Heart disease Father     MI's  . COPD Sister     heavy smoker  . Cancer Maternal Aunt     colon  . Cancer Paternal Aunt     breast  . Osteopenia  Sister    Allergies  Allergen Reactions  . Codeine     REACTION: unknown reaction  . Klonopin (Clonazepam)     Dizziness , fatigue  . Nsaids     REACTION: diarrhea  . Omeprazole     REACTION: did not work for her  . Pseudoephedrine     REACTION: jittery   Current Outpatient Prescriptions on File Prior to Visit  Medication Sig Dispense Refill  . Calcium Carbonate-Vitamin D (CALCIUM 600-D) 600-400 MG-UNIT per tablet Take 1 tablet by mouth daily.        . Cholecalciferol (VITAMIN D) 2000 UNITS CAPS 1 capsule by mouth daily       . mometasone (NASONEX) 50 MCG/ACT nasal spray 2 sprays by Nasal route daily.  17 g  11  . ranitidine (ZANTAC) 150 MG capsule Take 1 capsule (150 mg total) by mouth 2 (two) times daily.  60 capsule  11  . fexofenadine (ALLEGRA) 60 MG tablet Take 60 mg by mouth 2 (two) times daily.            Review of Systems Review of Systems  Constitutional: Negative for fever,  appetite change, fatigue and unexpected weight change.  Eyes: Negative for pain and visual disturbance.  Respiratory: Negative for cough and shortness of breath.   Cardiovascular: Negative.  for cp or palpitations  Gastrointestinal: Negative for nausea, diarrhea and constipation.  Genitourinary: Negative for urgency and frequency.  Skin: Negative for pallor. no rash  Neurological: Negative for weakness,  numbness and headaches.  Hematological: Negative for adenopathy. Does not bruise/bleed easily.  Psychiatric/Behavioral:pos for depression that is improved , with little anxiety, neg for SI       Objective:   Physical Exam  Constitutional: She is oriented to person, place, and time. She appears well-developed and well-nourished.  HENT:  Head: Normocephalic and atraumatic.  Eyes: Conjunctivae and EOM are normal. Pupils are equal, round, and reactive to light.  Neck: Normal range of motion. Neck supple. No JVD present. No thyromegaly present.  Cardiovascular: Normal rate, regular rhythm, normal heart sounds and intact distal pulses.   Pulmonary/Chest: Effort normal and breath sounds normal. No respiratory distress. She has no wheezes.  Musculoskeletal: She exhibits no edema.  Lymphadenopathy:    She has no cervical adenopathy.  Neurological: She is alert and oriented to person, place, and time. She has normal strength and normal reflexes. She displays no tremor. No cranial nerve deficit or sensory deficit. She exhibits normal muscle tone. She displays a negative Romberg sign. She displays no seizure activity. Coordination and gait normal.       Nl tandem gait No focal cerebellar signs   Skin: Skin is warm and dry. No rash noted. No erythema. No pallor.  Psychiatric: She has a normal mood and affect.       Improved affect Not anxious or fatigued Good eye contact and comm skills          Assessment & Plan:

## 2011-02-21 NOTE — Patient Instructions (Addendum)
Lets change from [place] to [place]- it is shorter acting  Let me know if it helps you sleep  Keep going to counseling  Cut down to 30 mg of cymbalta- let me know if any problem If any more falling/ fainting - please alert me asap  Follow up in 2 months

## 2011-04-14 LAB — POCT I-STAT, CHEM 8
BUN: 13
Calcium, Ion: 1.27
Chloride: 104
Creatinine, Ser: 1
Glucose, Bld: 84
HCT: 36
Hemoglobin: 12.2
Potassium: 4.4
Sodium: 140
TCO2: 30

## 2011-04-14 LAB — URINALYSIS, ROUTINE W REFLEX MICROSCOPIC
Bilirubin Urine: NEGATIVE
Glucose, UA: NEGATIVE
Ketones, ur: NEGATIVE
pH: 6.5

## 2011-04-14 LAB — URINE MICROSCOPIC-ADD ON

## 2011-04-26 ENCOUNTER — Encounter: Payer: Self-pay | Admitting: Family Medicine

## 2011-04-26 ENCOUNTER — Ambulatory Visit (INDEPENDENT_AMBULATORY_CARE_PROVIDER_SITE_OTHER): Payer: BC Managed Care – PPO | Admitting: Family Medicine

## 2011-04-26 DIAGNOSIS — F329 Major depressive disorder, single episode, unspecified: Secondary | ICD-10-CM

## 2011-04-26 DIAGNOSIS — Z23 Encounter for immunization: Secondary | ICD-10-CM

## 2011-04-26 DIAGNOSIS — G4700 Insomnia, unspecified: Secondary | ICD-10-CM

## 2011-04-26 NOTE — Assessment & Plan Note (Signed)
Chronic/ long term - and worse if depression is worse  ambien worked in past but issue to oversedation/ poss syncope in am  Will try sonata - shorter acting - taking much earlier with earlier bedtime Has good grasp of sleep hygiene etc

## 2011-04-26 NOTE — Assessment & Plan Note (Signed)
Overall doing much better on cymbalta 30 and less stressors - she may consider transition to prozac in future (disc poss expectations with this)- but not now since she is doing well Enc healthy diet / outdoor time and exercise

## 2011-04-26 NOTE — Progress Notes (Signed)
Subjective:    Patient ID: Diane Delacruz, female    DOB: [birth year], 65 y.o.   MRN: [id number]  HPI Here for f/u of depression - now on cymbalta 30 mg and also sleep disorder Is doing pretty well overall  Depression is well controlled at that dose  No side effects  Was interested in past of transitioning to prozac due to cost   Has been on prozac in the past  Not ready to transition yet   Some concern of ambien causing ? Syncope vs falling asleep on the floor  Changed to shorter acting agent- sonata She has not used sonata that much due to her work shift- works am hours  ---really cannot take anything at all  Goes to bed at 11:30 and gets up around 6 (cannot go to bed earlier)   Is trying to get fruit and veg and protien in diet    Wt is up 3 lb with bmi of 25  Patient Active Problem List  Diagnoses  . HYPOGLYCEMIA, UNSPECIFIED  . VITAMIN D DEFICIENCY  . HYPERCHOLESTEROLEMIA  . DEPRESSION  . VARICOSE VEINS, LOWER EXTREMITIES  . ALLERGIC RHINITIS, SEASONAL  . GERD  . IRRITABLE BOWEL SYNDROME  . FIBROCYSTIC BREAST DISEASE  . POSTMENOPAUSAL STATUS  . BACK PAIN, LUMBAR  . OSTEOPENIA  . PALPITATIONS  . Routine general medical examination at a health care facility  . Other screening mammogram  . Post-menopausal  . Family history of colon cancer  . Hyperlipidemia  . Syncope  . Insomnia   Past Medical History  Diagnosis Date  . Depression   . CAD (coronary artery disease)   . Palpitations 03/06/09    event monitor  . Unspecified vitamin D deficiency   . Hypoglycemia, unspecified   . Dizziness and giddiness   . Lumbago   . Irritable bowel syndrome   . Unspecified adverse effect of other drug, medicinal and biological substance   . Asymptomatic varicose veins   . Disorder of bone and cartilage, unspecified   . Pure hypercholesterolemia   . Diffuse cystic mastopathy   . Allergic rhinitis due to pollen   . Symptomatic menopausal or female climacteric states   .  Esophageal reflux   . Dyspnea 2008    negative echo and MV scan  . DDD (degenerative disc disease)     in neck, neurosurgeon Dr. Dutch Quint  . Stress fracture of foot 03/28/99    left  . Bladder tumor 11/05   Past Surgical History  Procedure Date  . Cholecystectomy     gallstones- ultrasound 10/03   History  Substance Use Topics  . Smoking status: Never Smoker   . Smokeless tobacco: Not on file  . Alcohol Use: Not on file   Family History  Problem Relation Age of Onset  . Hyperlipidemia Mother   . Stroke Mother   . Arthritis Mother   . Macular degeneration Mother   . Cancer Father     colon  . Heart disease Father     MI's  . COPD Sister     heavy smoker  . Cancer Maternal Aunt     colon  . Cancer Paternal Aunt     breast  . Osteopenia Sister    Allergies  Allergen Reactions  . Codeine     REACTION: unknown reaction  . Klonopin (Clonazepam)     Dizziness , fatigue  . Nsaids     REACTION: diarrhea  . Omeprazole     REACTION: did  not work for her  . Pseudoephedrine     REACTION: jittery   Current Outpatient Prescriptions on File Prior to Visit  Medication Sig Dispense Refill  . Calcium Carbonate-Vitamin D (CALCIUM 600-D) 600-400 MG-UNIT per tablet Take 1 tablet by mouth daily.        . Cholecalciferol (VITAMIN D) 2000 UNITS CAPS 1 capsule by mouth daily       . DULoxetine (CYMBALTA) 30 MG capsule Take 1 capsule (30 mg total) by mouth daily.  30 capsule  11  . fexofenadine (ALLEGRA) 60 MG tablet Take 60 mg by mouth 2 (two) times daily.        . ranitidine (ZANTAC) 150 MG capsule Take 1 capsule (150 mg total) by mouth 2 (two) times daily.  60 capsule  11  . mometasone (NASONEX) 50 MCG/ACT nasal spray 2 sprays by Nasal route daily.  17 g  11      Review of Systems Review of Systems  Constitutional: Negative for fever, appetite change, fatigue and unexpected weight change.  Eyes: Negative for pain and visual disturbance.  Respiratory: Negative for cough and  shortness of breath.   Cardiovascular: Negative for cp or palpitations    Gastrointestinal: Negative for nausea, diarrhea and constipation.  Genitourinary: Negative for urgency and frequency.  Skin: Negative for pallor or rash   Neurological: Negative for weakness, light-headedness, numbness and headaches.  Hematological: Negative for adenopathy. Does not bruise/bleed easily.  Psychiatric/Behavioral: Negative for dysphoric mood. Anxiety and mood are generally improved.           Objective:   Physical Exam  Constitutional: She appears well-developed and well-nourished. No distress.  HENT:  Head: Normocephalic and atraumatic.  Mouth/Throat: Oropharynx is clear and moist.  Eyes: Conjunctivae and EOM are normal. Pupils are equal, round, and reactive to light.  Neck: Normal range of motion. Neck supple. No JVD present. Carotid bruit is not present. No thyromegaly present.  Cardiovascular: Normal rate, regular rhythm, normal heart sounds and intact distal pulses.   Pulmonary/Chest: Effort normal and breath sounds normal. No respiratory distress. She has no wheezes.  Abdominal: Soft. Bowel sounds are normal. She exhibits no distension and no mass. There is no tenderness.  Musculoskeletal: Normal range of motion. She exhibits no edema and no tenderness.  Lymphadenopathy:    She has no cervical adenopathy.  Neurological: She is alert. She has normal reflexes. No cranial nerve deficit. Coordination normal.       No tremor   Skin: Skin is warm and dry. No rash noted. No erythema. No pallor.  Psychiatric: She has a normal mood and affect.          Assessment & Plan:

## 2011-04-26 NOTE — Patient Instructions (Addendum)
I'm glad you are doing better with depression Stay with cymbalta 30 mg since it is working well Try the sonata for sleep- take it at 9:30 and try to go to bed at 10-- then see if it leaves you more alert in the am  Eat healthy diet and stay active Avoid caffiene Schedule check up 30 minutes annual  in late April with labs prior please

## 2011-09-17 IMAGING — CR DG CERVICAL SPINE COMPLETE 4+V
5 series · 5 of 5 positions shown · non-contrast
Comparison: None.

CLINICAL DATA: Bilateral arm and hand numbness and tingling, some
neck pain, no injury

CERVICAL SPINE - COMPLETE 4+ VIEW

[view not recorded (1 of 5)]
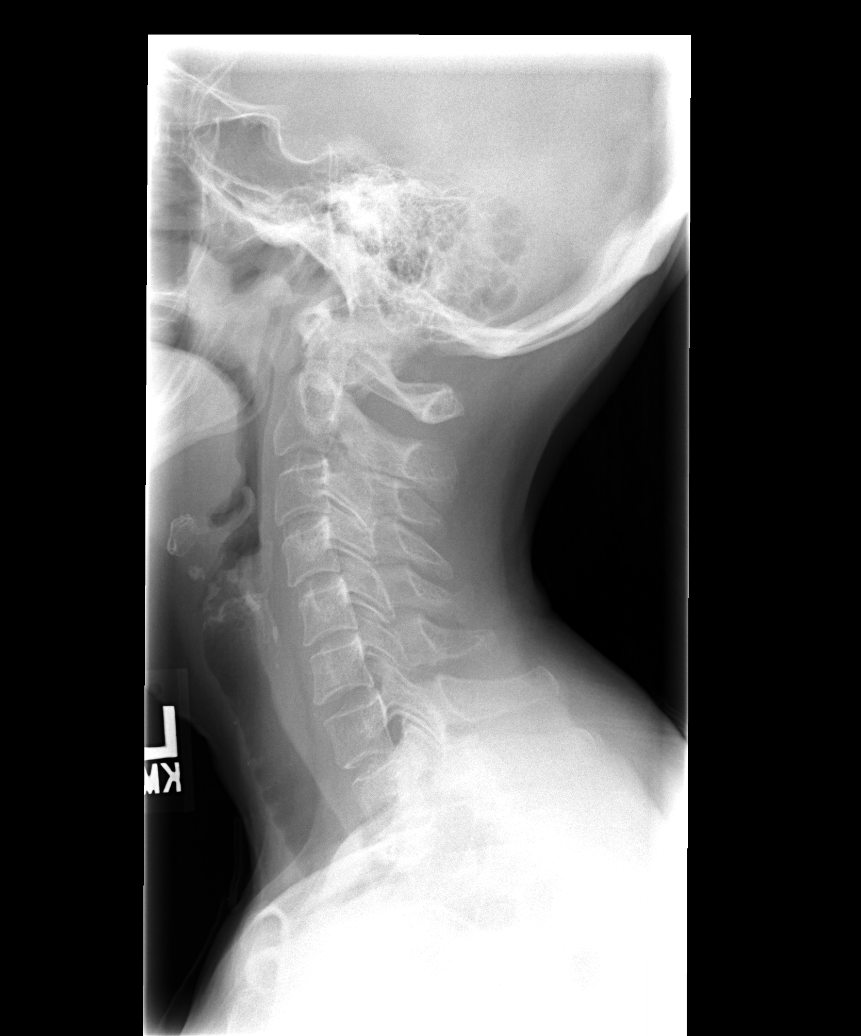

[view not recorded (2 of 5)]
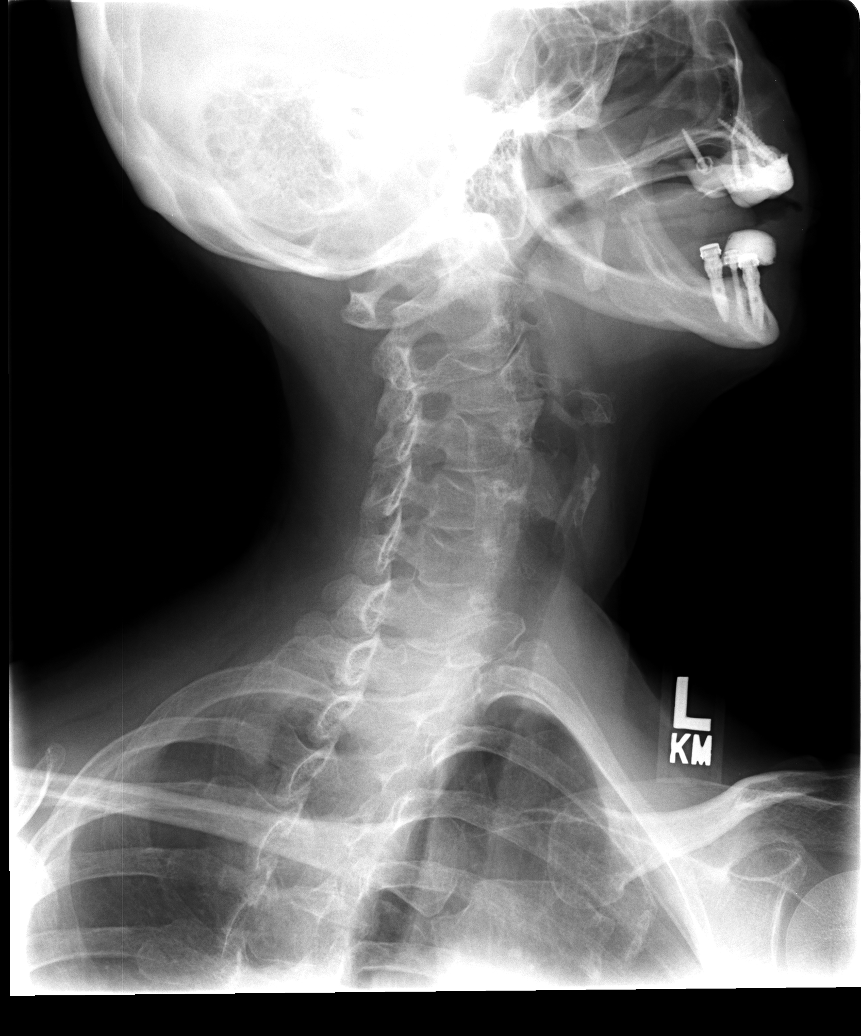

[view not recorded (3 of 5)]
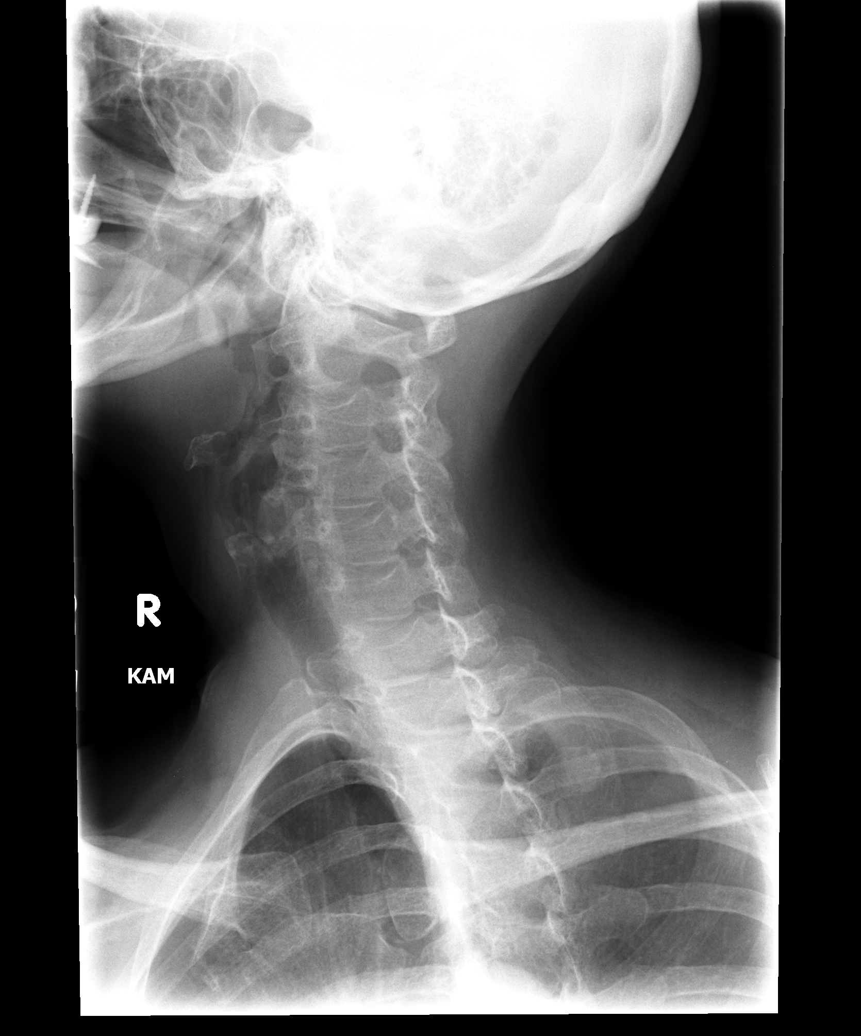

[view not recorded (4 of 5)]
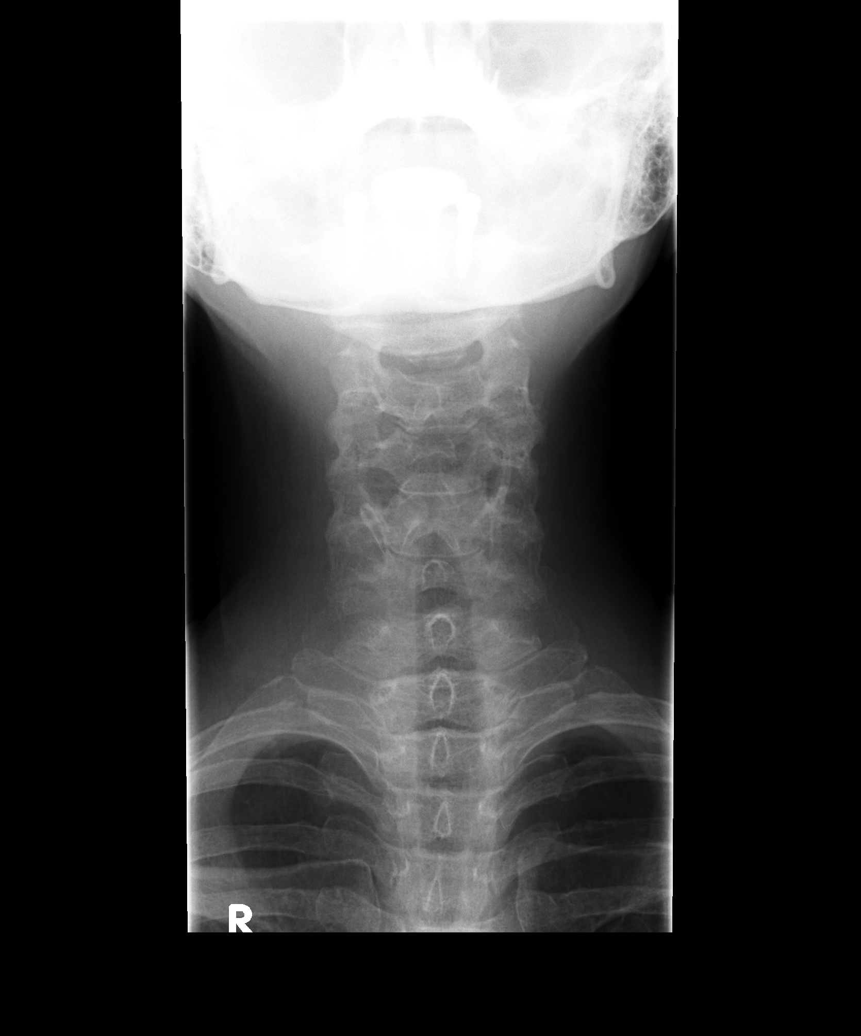

[view not recorded (5 of 5)]
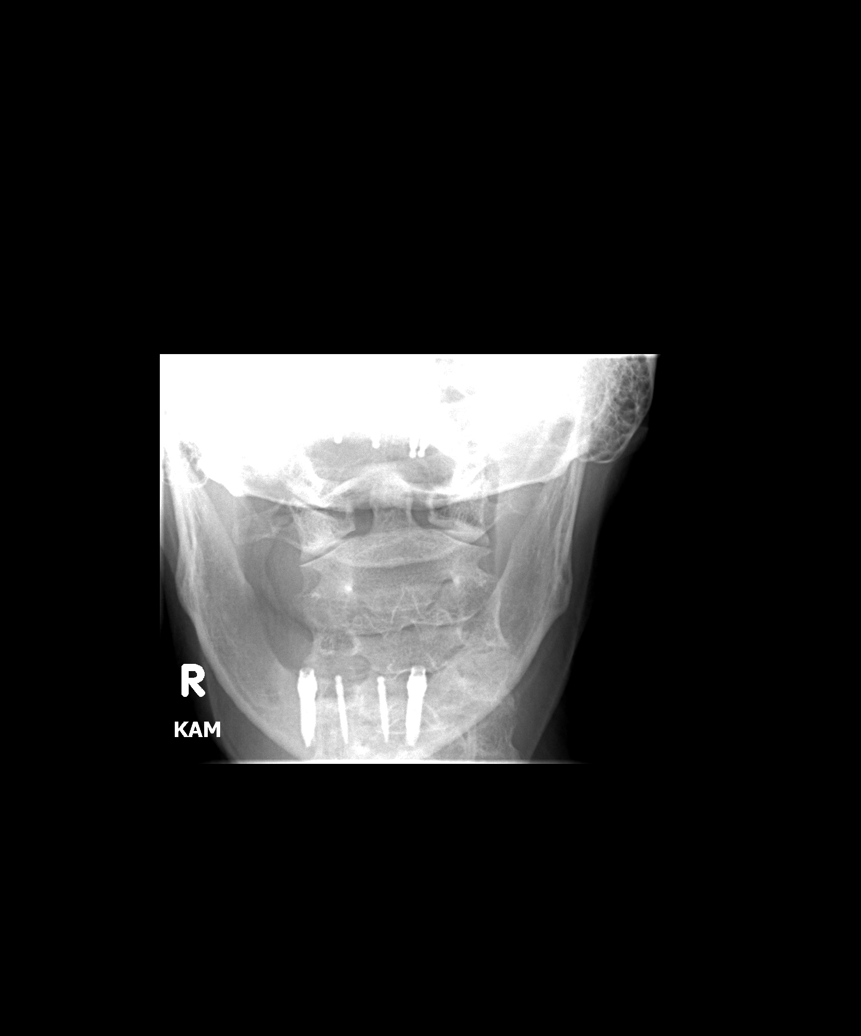

[5 of 5 positions shown; findings below may reference images not displayed]

FINDINGS: The cervical vertebrae are in normal alignment.  There is
perhaps minimally decreased disc space at C5-6 with very mild
foraminal narrowing at that level.  No prevertebral soft tissue
swelling is seen.  The odontoid process is intact.
IMPRESSION: Normal alignment with only mild degenerative change at C5-6.
Minimal foraminal narrowing at C5-6.

## 2011-09-28 ENCOUNTER — Encounter: Payer: Self-pay | Admitting: Family Medicine

## 2011-09-28 ENCOUNTER — Ambulatory Visit (INDEPENDENT_AMBULATORY_CARE_PROVIDER_SITE_OTHER): Payer: BC Managed Care – PPO | Admitting: Family Medicine

## 2011-09-28 VITALS — BP 116/64 | HR 76 | Temp 98.0°F | Wt 140.8 lb

## 2011-09-28 DIAGNOSIS — K219 Gastro-esophageal reflux disease without esophagitis: Secondary | ICD-10-CM

## 2011-09-28 DIAGNOSIS — R141 Gas pain: Secondary | ICD-10-CM

## 2011-09-28 DIAGNOSIS — R140 Abdominal distension (gaseous): Secondary | ICD-10-CM

## 2011-09-28 LAB — COMPREHENSIVE METABOLIC PANEL
ALT: 16 U/L (ref 0–35)
Alkaline Phosphatase: 83 U/L (ref 39–117)
CO2: 30 mEq/L (ref 19–32)
Creatinine, Ser: 0.9 mg/dL (ref 0.4–1.2)
GFR: 67.6 mL/min (ref >60.00)
Sodium: 141 mEq/L (ref 135–145)
Total Bilirubin: 0.3 mg/dL (ref 0.3–1.2)
Total Protein: 6.3 g/dL (ref 6.0–8.3)

## 2011-09-28 LAB — CBC WITH DIFFERENTIAL/PLATELET
Eosinophils Relative: 4.5 % (ref 0.0–5.0)
HCT: 37.5 % (ref 36.0–46.0)
Hemoglobin: 12.7 g/dL (ref 12.0–15.0)
Lymphs Abs: 1.7 10*3/uL (ref 0.7–4.0)
MCV: 94.7 fl (ref 78.0–100.0)
Monocytes Absolute: 0.4 10*3/uL (ref 0.1–1.0)
Monocytes Relative: 8.7 % (ref 3.0–12.0)
Neutro Abs: 2 10*3/uL (ref 1.4–7.7)
RDW: 13 % (ref 11.5–14.6)
WBC: 4.3 10*3/uL — ABNORMAL LOW (ref 4.5–10.5)

## 2011-09-28 LAB — LIPASE: Lipase: 48 U/L (ref 11.0–59.0)

## 2011-09-28 MED ORDER — ESOMEPRAZOLE MAGNESIUM 40 MG PO CPDR: 40.0000 mg | DELAYED_RELEASE_CAPSULE | Freq: Every day | ORAL | Status: DC

## 2011-09-28 NOTE — Patient Instructions (Signed)
This sounds like dyspepsia.  Treat with nexium daily for 3-4 weeks. Avoid acidic foods (citrus, spicy, mints). Blood work today to check on other causes. If not improved after 2-3 weeks of nexium, please let us know for referral to stomach doctor for endoscopy.  Indigestion Indigestion is discomfort in the upper abdomen that is caused by underlying problems such as gastroesophageal reflux disease (GERD), ulcers, or gallbladder problems.  CAUSES  Indigestion can be caused by many things. Possible causes include:  Stomach acid in the esophagus.   Stomach infections, usually caused by the bacteria H. pylori.   Being overweight.   Hiatal hernia. This means part of the stomach pushes up through the diaphragm.   Overeating.   Emotional problems, such as stress, anxiety, or depression.   Poor nutrition.   Consuming too much alcohol, tobacco, or caffeine.   Consuming spicy foods, fats, peppermint, chocolate, tomato products, citrus, or fruit juices.   Medicines such as aspirin and other anti-inflammatory drugs, hormones, steroids, and thyroid medicines.   Gastroparesis. This is a condition in which the stomach does not empty properly.   Stomach cancer.   Pregnancy, due to an increase in hormone levels, a relaxation of muscles in the digestive tract, and pressure on the stomach from the growing fetus.  SYMPTOMS   Uncomfortable feeling of fullness after eating.   Pain or burning sensation in the upper abdomen.   Bloating.   Belching and gas.   Nausea and vomiting.   Acidic taste in the mouth.   Burning sensation in the chest (heartburn).  DIAGNOSIS  Your caregiver will review your medical history and perform a physical exam. Other tests, such as blood tests, stool tests, X-rays, and other imaging scans, may be done to check for more serious problems. TREATMENT  Liquid antacids and other drugs may be given to block stomach acid secretion. Medicines that increase esophageal  muscle tone may also be given to help reduce symptoms. If an infection is found, antibiotic medicine may be given. HOME CARE INSTRUCTIONS  Avoid foods and drinks that make your symptoms worse, such as:   Caffeine or alcoholic drinks.   Chocolate.   Peppermint or mint flavorings.   Garlic and onions.   Spicy foods.   Citrus fruits, such as oranges, lemons, or limes.   Tomato-based foods such as sauce, chili, salsa, and pizza.   Fried and fatty foods.   Avoid eating for the 3 hours prior to your bedtime.   Eat small, frequent meals instead of large meals.   Stop smoking if you smoke.   Maintain a healthy weight.   Wear loose-fitting clothing. Do not wear anything tight around your waist that causes pressure on your stomach.   Raise the head of your bed 4 to 8 inches with wood blocks to help you sleep. Extra pillows will not help.   Only take over-the-counter or prescription medicines as directed by your caregiver.   Do not take aspirin, ibuprofen, or other nonsteroidal anti-inflammatory drugs (NSAIDs).  SEEK IMMEDIATE MEDICAL CARE IF:   You are not better after 2 days.   You have chest pressure or pain that radiates up into your neck, arms, back, jaw, or upper abdomen.   You have difficulty swallowing.   You keep vomiting.   You have black or bloody stools.   You have a fever.   You have dizziness, fainting, difficulty breathing, or heavy sweating.   You have severe abdominal pain.   You lose weight without trying.  MAKE SURE YOU:  Understand these instructions.   Will watch your condition.   Will get help right away if you are not doing well or get worse.  Document Released: 08/11/2004 Document Revised: 06/23/2011 Document Reviewed: 02/16/2011 [facility] Patient Information 2012 [place], [place].

## 2011-09-28 NOTE — Assessment & Plan Note (Signed)
Sounds like dyspepsia. Check blood work today (h pylori, CBC, LFTs and lipase). Treat with PPI x 3-4 wks. Early satiety of 1 wk duration, no weight changes. If not better, discussed referral to GI for EGD.

## 2011-09-28 NOTE — Progress Notes (Signed)
  Subjective:    Patient ID: Diane Delacruz, female    DOB: Sep 18, 1945, 66 y.o.   MRN: [id number]  HPI CC: stomach issues  66 yo pt of Dr. Karie Schwalbe new to me with h/o IBS and GERD on daily zantac 150mg  bid presents with several week h/o upper abdominal bloating.  For 2 days unable to wear tight pants.  + more gassy than normal.  Also associated with intermittent back pain described as ache that lasts 30 min.  No abd pain.  IBS when flares presents with diarrhea, lower abd cramping.  No RUQ pain.  Not associated with any specific foods.  Not worse with fatty or greasy meal.  Decreased appetite.  + early satiety x 1 wk.  Feels some nausea after eating.  No fevers/chills, vomiting, no blood in stool.  No BM changes, constipation or diarrhea.  No NS.  No weight changes.    Avoids NSAIDs.   Wt Readings from Last 3 Encounters:  09/28/11 140 lb 12 oz (63.844 kg)  04/26/11 139 lb 4 oz (63.163 kg)  02/21/11 136 lb 8 oz (61.916 kg)   prilosec didn't work in past.  On zantac for 3-4 years. No recent travel outside of country.  Father and aunt with colon cancer. Due for colonoscopy.  Never had EGD.  Past Medical History  Diagnosis Date  . Depression   . CAD (coronary artery disease)   . Palpitations 03/06/09    event monitor  . Unspecified vitamin D deficiency   . Hypoglycemia, unspecified   . Dizziness and giddiness   . Lumbago   . Irritable bowel syndrome   . Unspecified adverse effect of other drug, medicinal and biological substance   . Asymptomatic varicose veins   . Disorder of bone and cartilage, unspecified   . Pure hypercholesterolemia   . Diffuse cystic mastopathy   . Allergic rhinitis due to pollen   . Symptomatic menopausal or female climacteric states   . Esophageal reflux   . Dyspnea 2008    negative echo and MV scan  . DDD (degenerative disc disease)     in neck, neurosurgeon Dr. Dutch Quint  . Stress fracture of foot 03/28/99    left  . Bladder tumor 11/05   Review of  Systems Per HPI    Objective:   Physical Exam  Nursing note and vitals reviewed. Constitutional: She appears well-developed and well-nourished. No distress.  HENT:  Mouth/Throat: Oropharynx is clear and moist. No oropharyngeal exudate.  Cardiovascular: Normal rate, regular rhythm, normal heart sounds and intact distal pulses.   No murmur heard. Pulmonary/Chest: Effort normal and breath sounds normal. No respiratory distress. She has no wheezes. She has no rales.  Abdominal: Soft. Bowel sounds are normal. She exhibits no distension and no mass. There is no hepatosplenomegaly. There is tenderness (mild LUQ). There is no rebound, no guarding and no CVA tenderness.  Musculoskeletal: She exhibits no edema.  Skin: Skin is warm and dry. No rash noted.  Psychiatric: She has a normal mood and affect.       Assessment & Plan:

## 2011-10-11 ENCOUNTER — Encounter: Payer: Self-pay | Admitting: Gastroenterology

## 2011-10-11 ENCOUNTER — Encounter: Payer: Self-pay | Admitting: Family Medicine

## 2011-10-11 ENCOUNTER — Ambulatory Visit (INDEPENDENT_AMBULATORY_CARE_PROVIDER_SITE_OTHER): Payer: BC Managed Care – PPO | Admitting: Family Medicine

## 2011-10-11 VITALS — BP 110/70 | HR 70 | Temp 97.9°F | Ht 62.5 in | Wt 140.8 lb

## 2011-10-11 DIAGNOSIS — R141 Gas pain: Secondary | ICD-10-CM

## 2011-10-11 DIAGNOSIS — K589 Irritable bowel syndrome without diarrhea: Secondary | ICD-10-CM

## 2011-10-11 DIAGNOSIS — K219 Gastro-esophageal reflux disease without esophagitis: Secondary | ICD-10-CM

## 2011-10-11 DIAGNOSIS — R140 Abdominal distension (gaseous): Secondary | ICD-10-CM

## 2011-10-11 NOTE — Progress Notes (Signed)
Subjective:    Patient ID: Diane Delacruz, female    DOB: 09/09/1945, 66 y.o.   MRN: [id number]  HPI Here for f/u of indigestion and abd bloating - thought to be dyspepsia Pt last saw Dr Reece Agar- was given nexium to try instead of zantac  Overall is not a lot better  Tried samples of nexium- did not help so did not get px filled  Made her feel even more bloated (though gerd may have been a tad better)   bm- less frequent -- then followed by loose stool   Labs all nl incl cbc/ chem/ panc/ h pylori/ hepatic  Wt is stable  with bmi of 25  Family hx of colon ca father and aunt - is due for colonoscopy  Dr Wilford Corner if she could have a gluten intol Daughter is all to gluten and many many foods   Most abd pain and tenderness is LUQ area  Most bloating is in lower abd  Patient Active Problem List  Diagnoses  . HYPOGLYCEMIA, UNSPECIFIED  . VITAMIN D DEFICIENCY  . HYPERCHOLESTEROLEMIA  . DEPRESSION  . VARICOSE VEINS, LOWER EXTREMITIES  . ALLERGIC RHINITIS, SEASONAL  . GERD  . IRRITABLE BOWEL SYNDROME  . FIBROCYSTIC BREAST DISEASE  . POSTMENOPAUSAL STATUS  . BACK PAIN, LUMBAR  . OSTEOPENIA  . PALPITATIONS  . Routine general medical examination at a health care facility  . Other screening mammogram  . Post-menopausal  . Family history of colon cancer  . Hyperlipidemia  . Syncope  . Insomnia  . Abdominal bloating   Past Medical History  Diagnosis Date  . Depression   . CAD (coronary artery disease)   . Palpitations 03/06/09    event monitor  . Unspecified vitamin D deficiency   . Hypoglycemia, unspecified   . Dizziness and giddiness   . Lumbago   . Irritable bowel syndrome   . Unspecified adverse effect of other drug, medicinal and biological substance   . Asymptomatic varicose veins   . Disorder of bone and cartilage, unspecified   . Pure hypercholesterolemia   . Diffuse cystic mastopathy   . Allergic rhinitis due to pollen   . Symptomatic menopausal or female  climacteric states   . Esophageal reflux   . Dyspnea 2008    negative echo and MV scan  . DDD (degenerative disc disease)     in neck, neurosurgeon Dr. Dutch Quint  . Stress fracture of foot 03/28/99    left  . Bladder tumor 11/05   Past Surgical History  Procedure Date  . Cholecystectomy     gallstones- ultrasound 10/03   History  Substance Use Topics  . Smoking status: Never Smoker   . Smokeless tobacco: Not on file  . Alcohol Use: Yes     Rare-wine   Family History  Problem Relation Age of Onset  . Hyperlipidemia Mother   . Stroke Mother   . Arthritis Mother   . Macular degeneration Mother   . Cancer Father     colon  . Heart disease Father     MI's  . COPD Sister     heavy smoker  . Cancer Maternal Aunt     colon  . Cancer Paternal Aunt     breast  . Osteopenia Sister    Allergies  Allergen Reactions  . Codeine     REACTION: unknown reaction  . Klonopin (Clonazepam)     Dizziness , fatigue  . Nsaids     REACTION:  diarrhea  . Omeprazole     REACTION: did not work for her  . Pseudoephedrine     REACTION: jittery   Current Outpatient Prescriptions on File Prior to Visit  Medication Sig Dispense Refill  . Calcium Carbonate-Vitamin D (CALCIUM 600-D) 600-400 MG-UNIT per tablet Take 1 tablet by mouth daily.        . Cholecalciferol (VITAMIN D) 2000 UNITS CAPS 1 capsule by mouth daily       . DULoxetine (CYMBALTA) 30 MG capsule Take 1 capsule (30 mg total) by mouth daily.  30 capsule  11  . fexofenadine (ALLEGRA) 60 MG tablet Take 60 mg by mouth daily as needed.       . mometasone (NASONEX) 50 MCG/ACT nasal spray 2 sprays by Nasal route daily.  17 g  11  . ranitidine (ZANTAC) 150 MG capsule Take 1 capsule (150 mg total) by mouth 2 (two) times daily.  60 capsule  11  . zolpidem (AMBIEN) 5 MG tablet Take 5 mg by mouth at bedtime as needed.         Review of Systems Review of Systems  Constitutional: Negative for fever, appetite change, fatigue and unexpected  weight change.  Eyes: Negative for pain and visual disturbance.  Respiratory: Negative for cough and shortness of breath.   Cardiovascular: Negative for cp or palpitations    Gastrointestinal: Negative for nausea, vomiting, pos for intermittent constipation and loose stools , neg for blood in stool or rectal pain  Genitourinary: Negative for urgency and frequency.  Skin: Negative for pallor or rash   Neurological: Negative for weakness, light-headedness, numbness and headaches.  Hematological: Negative for adenopathy. Does not bruise/bleed easily.  Psychiatric/Behavioral: Negative for dysphoric mood. The patient is not nervous/anxious.         Objective:   Physical Exam  Constitutional: She appears well-developed and well-nourished. No distress.  HENT:  Head: Normocephalic and atraumatic.  Mouth/Throat: Oropharynx is clear and moist. No oropharyngeal exudate.  Eyes: Conjunctivae and EOM are normal. Pupils are equal, round, and reactive to light. Right eye exhibits no discharge. Left eye exhibits no discharge.  Neck: Normal range of motion. Neck supple. No JVD present. No thyromegaly present.  Cardiovascular: Normal rate, regular rhythm and normal heart sounds.  Exam reveals no gallop.   Pulmonary/Chest: Effort normal and breath sounds normal. No respiratory distress. She has no wheezes.  Abdominal: Soft. Normal appearance and bowel sounds are normal. She exhibits no shifting dullness, no distension, no pulsatile liver, no fluid wave, no abdominal bruit, no ascites and no mass. There is no hepatosplenomegaly. There is tenderness in the epigastric area and left upper quadrant. There is no rigidity, no rebound, no guarding, no CVA tenderness and negative Murphy's sign.  Musculoskeletal: She exhibits no edema and no tenderness.  Lymphadenopathy:    She has no cervical adenopathy.  Neurological: She is alert. She has normal reflexes. No cranial nerve deficit.  Skin: Skin is warm and dry. No  rash noted. No erythema. No pallor.  Psychiatric: She has a normal mood and affect.          Assessment & Plan:

## 2011-10-11 NOTE — Assessment & Plan Note (Signed)
Ongoing in combo with gerd/ IBS symptoms Rev her labs from last time - all nl  No help at this time with 2 diff PPI (prilosec and nexium) Wt is stable Will ref to GI for these problems (and incidentally also due for colonosc for fam hx)

## 2011-10-11 NOTE — Patient Instructions (Signed)
Continue the zantac  Consider a trial off gluten (wheat)  We will refer you to GI at check out  If symptoms worsen in meantime- please let me know

## 2011-10-11 NOTE — Assessment & Plan Note (Signed)
Heartburn is controlled with H2 blocker - but much bloating  F/u with GI planned

## 2011-10-11 NOTE — Assessment & Plan Note (Signed)
More bloating , also constipation and diarrhea intermittent Family hx of colon cancer F/u with GI planned - fur got colonoscopy

## 2011-10-13 ENCOUNTER — Other Ambulatory Visit: Payer: Self-pay | Admitting: *Deleted

## 2011-10-13 NOTE — Telephone Encounter (Signed)
Ok, thanks - please call pt if you can to make sure she is not running out before the wkend is over and see if she knows what the deal is thanks

## 2011-10-13 NOTE — Telephone Encounter (Signed)
Order hx says this was just refilled 2 weeks ago ? -- can you please clarify with pt or pharmacy, thanks

## 2011-10-13 NOTE — Telephone Encounter (Signed)
Called and spoke to pharmacist at [facility] and she stated that their computers are down and have been for 2 hours, she cannot give me any information.

## 2011-10-17 MED ORDER — ZOLPIDEM TARTRATE 5 MG PO TABS: 5.0000 mg | ORAL_TABLET | Freq: Every evening | ORAL | Status: DC | PRN

## 2011-10-17 NOTE — Telephone Encounter (Signed)
Spoke with the pharmacist and she stated that patient has not had the Rx filled at that pharmacy since early last year.

## 2011-10-17 NOTE — Telephone Encounter (Signed)
Left message on cell phone voicemail for patient to return call. 

## 2011-10-17 NOTE — Telephone Encounter (Signed)
Spoke with patient and advised as instructed, she stated that she did request the Zolpidem Rx because the Sonata made her jittery.  Rx called to [facility] pharmacy.

## 2011-10-17 NOTE — Telephone Encounter (Signed)
Thanks  Px written for call in  -- but please talk with pt first to make sure she wants it -- we disc in fall that she may have had some oversedation with it and were going to try sonata instead thanks

## 2011-10-30 ENCOUNTER — Telehealth: Payer: Self-pay | Admitting: Family Medicine

## 2011-10-30 DIAGNOSIS — K219 Gastro-esophageal reflux disease without esophagitis: Secondary | ICD-10-CM

## 2011-10-30 DIAGNOSIS — E559 Vitamin D deficiency, unspecified: Secondary | ICD-10-CM

## 2011-10-30 DIAGNOSIS — E7800 Pure hypercholesterolemia, unspecified: Secondary | ICD-10-CM

## 2011-10-30 DIAGNOSIS — E785 Hyperlipidemia, unspecified: Secondary | ICD-10-CM

## 2011-10-30 DIAGNOSIS — M949 Disorder of cartilage, unspecified: Secondary | ICD-10-CM

## 2011-10-30 DIAGNOSIS — M899 Disorder of bone, unspecified: Secondary | ICD-10-CM

## 2011-10-30 NOTE — Telephone Encounter (Signed)
Message copied by Judy Pimple on Sun Oct 30, 2011  4:34 PM ------      Message from: Alvina Chou      Created: Tue Oct 25, 2011 11:09 AM      Regarding: lab for 4.15.13       Patient is scheduled for CPX labs, please order future labs, Thanks , Camelia Eng

## 2011-10-31 ENCOUNTER — Other Ambulatory Visit (INDEPENDENT_AMBULATORY_CARE_PROVIDER_SITE_OTHER): Payer: Medicare Other

## 2011-10-31 DIAGNOSIS — M899 Disorder of bone, unspecified: Secondary | ICD-10-CM

## 2011-10-31 DIAGNOSIS — K219 Gastro-esophageal reflux disease without esophagitis: Secondary | ICD-10-CM

## 2011-10-31 DIAGNOSIS — E559 Vitamin D deficiency, unspecified: Secondary | ICD-10-CM

## 2011-10-31 DIAGNOSIS — E7800 Pure hypercholesterolemia, unspecified: Secondary | ICD-10-CM

## 2011-10-31 DIAGNOSIS — M949 Disorder of cartilage, unspecified: Secondary | ICD-10-CM

## 2011-10-31 LAB — COMPREHENSIVE METABOLIC PANEL
ALT: 18 U/L (ref 0–35)
AST: 22 U/L (ref 0–37)
Alkaline Phosphatase: 84 U/L (ref 39–117)
Creatinine, Ser: 0.9 mg/dL (ref 0.4–1.2)
GFR: 64.23 mL/min (ref >60.00)
Total Bilirubin: 0.6 mg/dL (ref 0.3–1.2)

## 2011-10-31 LAB — CBC WITH DIFFERENTIAL/PLATELET
Basophils Absolute: 0 10*3/uL (ref 0.0–0.1)
Eosinophils Absolute: 0.2 10*3/uL (ref 0.0–0.7)
HCT: 39.7 % (ref 36.0–46.0)
Hemoglobin: 13.1 g/dL (ref 12.0–15.0)
Lymphs Abs: 2.6 10*3/uL (ref 0.7–4.0)
MCHC: 33 g/dL (ref 30.0–36.0)
Monocytes Absolute: 0.5 10*3/uL (ref 0.1–1.0)
Neutro Abs: 2 10*3/uL (ref 1.4–7.7)
Platelets: 264 10*3/uL (ref 150.0–400.0)
RDW: 13.1 % (ref 11.5–14.6)

## 2011-10-31 LAB — LIPID PANEL
HDL: 78.2 mg/dL (ref >39.00)
Total CHOL/HDL Ratio: 3
VLDL: 12.8 mg/dL (ref 0.0–40.0)

## 2011-10-31 LAB — TSH: TSH: 1.85 u[IU]/mL (ref 0.35–5.50)

## 2011-11-01 LAB — VITAMIN D 25 HYDROXY (VIT D DEFICIENCY, FRACTURES): Vit D, 25-Hydroxy: 60 ng/mL (ref 30–89)

## 2011-11-02 ENCOUNTER — Other Ambulatory Visit (HOSPITAL_COMMUNITY)
Admission: RE | Admit: 2011-11-02 | Discharge: 2011-11-02 | Disposition: A | Discharge status: Home / Self Care | Payer: Medicare Other | Source: Ambulatory Visit | Attending: Family Medicine | Admitting: Family Medicine

## 2011-11-02 ENCOUNTER — Ambulatory Visit (INDEPENDENT_AMBULATORY_CARE_PROVIDER_SITE_OTHER): Payer: Medicare Other | Admitting: Family Medicine

## 2011-11-02 ENCOUNTER — Encounter: Payer: Self-pay | Admitting: Family Medicine

## 2011-11-02 VITALS — BP 82/60 | HR 73 | Temp 98.0°F | Ht 62.5 in

## 2011-11-02 DIAGNOSIS — Z1231 Encounter for screening mammogram for malignant neoplasm of breast: Secondary | ICD-10-CM

## 2011-11-02 DIAGNOSIS — Z01419 Encounter for gynecological examination (general) (routine) without abnormal findings: Secondary | ICD-10-CM | POA: Insufficient documentation

## 2011-11-02 DIAGNOSIS — M899 Disorder of bone, unspecified: Secondary | ICD-10-CM

## 2011-11-02 DIAGNOSIS — N951 Menopausal and female climacteric states: Secondary | ICD-10-CM

## 2011-11-02 DIAGNOSIS — E785 Hyperlipidemia, unspecified: Secondary | ICD-10-CM

## 2011-11-02 DIAGNOSIS — Z23 Encounter for immunization: Secondary | ICD-10-CM

## 2011-11-02 DIAGNOSIS — Z1159 Encounter for screening for other viral diseases: Secondary | ICD-10-CM | POA: Insufficient documentation

## 2011-11-02 MED ORDER — MOMETASONE FUROATE 50 MCG/ACT NA SUSP: 2.0000 | Freq: Every day | NASAL | Status: DC

## 2011-11-02 MED ORDER — RANITIDINE HCL 150 MG PO CAPS: 150.0000 mg | ORAL_CAPSULE | Freq: Two times a day (BID) | ORAL | Status: DC

## 2011-11-02 NOTE — Progress Notes (Signed)
Subjective:    Patient ID: Diane Delacruz, female    DOB: March 23, 1946, 66 y.o.   MRN: [id number]  HPI Here for check up of chronic medical conditions and to review health mt list  No new medical problems    bp is on the low side 82/60 Tends to run low - and not dizzy  bmi - not weighed today  Zoster status-- is interested in one  Had shingles years ago   Pneumovax Wants to do that today   colonosc 04- father had colon cancer Is due for colonosc- has appt upcoming with GI - Dr Russella Dar soon  Still having bloating- will disc that too   mammo 4/12 - is due - [facility]  Self exam -no lumps or changes  Pap 3/10 Time for 3 year pap  No history of problems     dexa 5/11 -osteopenia  Ca and D - is good about that  Is also walking on treadmill- also active outside  Hx of D def  Lab Results  Component Value Date   CHOL 246* 10/31/2011   CHOL 213* 12/01/2010   CHOL 251* 10/29/2010   Lab Results  Component Value Date   HDL 78.20 10/31/2011   HDL 40.98 12/01/2010   HDL 11.91 10/29/2010   No results found for this basename: [facility]   Lab Results  Component Value Date   TRIG 64.0 10/31/2011   TRIG 77.0 12/01/2010   TRIG 70.0 10/29/2010   Lab Results  Component Value Date   CHOLHDL 3 10/31/2011   CHOLHDL 3 12/01/2010   CHOLHDL 4 10/29/2010   Lab Results  Component Value Date   LDLDIRECT 144.6 10/31/2011   LDLDIRECT 137.0 12/01/2010   LDLDIRECT 153.5 10/29/2010   LDL and HDL both went up  Worse diet - the past week  Knows what to stay away from    Patient Active Problem List  Diagnoses  . HYPOGLYCEMIA, UNSPECIFIED  . VITAMIN D DEFICIENCY  . HYPERCHOLESTEROLEMIA  . DEPRESSION  . VARICOSE VEINS, LOWER EXTREMITIES  . ALLERGIC RHINITIS, SEASONAL  . GERD  . IRRITABLE BOWEL SYNDROME  . FIBROCYSTIC BREAST DISEASE  . POSTMENOPAUSAL STATUS  . BACK PAIN, LUMBAR  . OSTEOPENIA  . PALPITATIONS  . Routine general medical examination at a health care facility  .  Other screening mammogram  . Post-menopausal  . Family history of colon cancer  . Hyperlipidemia  . Syncope  . Insomnia  . Abdominal bloating  . Gynecological examination   Past Medical History  Diagnosis Date  . Depression   . CAD (coronary artery disease)   . Palpitations 03/06/09    event monitor  . Unspecified vitamin D deficiency   . Hypoglycemia, unspecified   . Dizziness and giddiness   . Lumbago   . Irritable bowel syndrome   . Unspecified adverse effect of other drug, medicinal and biological substance   . Asymptomatic varicose veins   . Disorder of bone and cartilage, unspecified   . Pure hypercholesterolemia   . Diffuse cystic mastopathy   . Allergic rhinitis due to pollen   . Symptomatic menopausal or female climacteric states   . Esophageal reflux   . Dyspnea 2008    negative echo and MV scan  . DDD (degenerative disc disease)     in neck, neurosurgeon Dr. Dutch Quint  . Stress fracture of foot 03/28/99    left  . Bladder tumor 11/05   Past Surgical History  Procedure Date  . Cholecystectomy  gallstones- ultrasound 10/03   History  Substance Use Topics  . Smoking status: Never Smoker   . Smokeless tobacco: Not on file  . Alcohol Use: Yes     Rare-wine   Family History  Problem Relation Age of Onset  . Hyperlipidemia Mother   . Stroke Mother   . Arthritis Mother   . Macular degeneration Mother   . Cancer Father     colon  . Heart disease Father     MI's  . COPD Sister     heavy smoker  . Cancer Maternal Aunt     colon  . Cancer Paternal Aunt     breast  . Osteopenia Sister    Allergies  Allergen Reactions  . Codeine     REACTION: unknown reaction  . Klonopin (Clonazepam)     Dizziness , fatigue  . Nsaids     REACTION: diarrhea  . Omeprazole     REACTION: did not work for her  . Pseudoephedrine     REACTION: jittery   Current Outpatient Prescriptions on File Prior to Visit  Medication Sig Dispense Refill  . Calcium  Carbonate-Vitamin D (CALCIUM 600-D) 600-400 MG-UNIT per tablet Take 1 tablet by mouth daily.        . Cholecalciferol (VITAMIN D) 2000 UNITS CAPS 1 capsule by mouth daily       . DULoxetine (CYMBALTA) 30 MG capsule Take 1 capsule (30 mg total) by mouth daily.  30 capsule  11  . fexofenadine (ALLEGRA) 60 MG tablet Take 60 mg by mouth daily as needed.       . mometasone (NASONEX) 50 MCG/ACT nasal spray Place 2 sprays into the nose daily.  17 g  11  . ranitidine (ZANTAC) 150 MG capsule Take 1 capsule (150 mg total) by mouth 2 (two) times daily.  60 capsule  11  . zolpidem (AMBIEN) 5 MG tablet Take 1 tablet (5 mg total) by mouth at bedtime as needed.  30 tablet  0  . zaleplon (SONATA) 5 MG capsule Take 1 capsule (5 mg total) by mouth at bedtime.  30 capsule  1    Review of Systems Review of Systems  Constitutional: Negative for fever, appetite change, fatigue and unexpected weight change.  Eyes: Negative for pain and visual disturbance.  Respiratory: Negative for cough and shortness of breath.   Cardiovascular: Negative for cp or palpitations    Gastrointestinal: Negative for nausea, diarrhea and constipation.  Genitourinary: Negative for urgency and frequency.  Skin: Negative for pallor or rash   Neurological: Negative for weakness, light-headedness, numbness and headaches.  Hematological: Negative for adenopathy. Does not bruise/bleed easily.  Psychiatric/Behavioral: Negative for dysphoric mood. The patient is not nervous/anxious.         Objective:   Physical Exam  Constitutional: She appears well-developed and well-nourished. No distress.  HENT:  Head: Normocephalic and atraumatic.  Right Ear: External ear normal.  Left Ear: External ear normal.  Nose: Nose normal.  Mouth/Throat: Oropharynx is clear and moist.  Eyes: Conjunctivae and EOM are normal. Pupils are equal, round, and reactive to light. No scleral icterus.  Neck: Normal range of motion. Neck supple. No JVD present. Carotid  bruit is not present. No thyromegaly present.  Cardiovascular: Normal rate, regular rhythm, normal heart sounds and intact distal pulses.   Pulmonary/Chest: Effort normal and breath sounds normal. No respiratory distress. She has no wheezes.  Abdominal: Soft. Bowel sounds are normal. She exhibits no distension, no abdominal bruit and no  mass. There is no guarding.  Genitourinary: No breast swelling, tenderness, discharge or bleeding.       Breast exam: No mass, nodules, thickening, tenderness, bulging, retraction, inflamation, nipple discharge or skin changes noted.  No axillary or clavicular LA.  Chaperoned exam.     o External genitalia nl appearing  o Urethral meatus nl appearing  o Urethra  Nl and not hyper mobile o Bladder nl  o Vagina nl labia and mucosa, no bleeding o Cervix  Nl / no friability o Uterus  Nl , not fixed or enl or tender o Adnexa/parametria -nl  o Anus and perineum nl   Musculoskeletal: Normal range of motion. She exhibits no edema and no tenderness.  Lymphadenopathy:    She has no cervical adenopathy.  Neurological: She is alert. She has normal reflexes. No cranial nerve deficit. She exhibits normal muscle tone. Coordination normal.  Skin: Skin is warm and dry. No rash noted. No erythema. No pallor.  Psychiatric: She has a normal mood and affect.          Assessment & Plan:

## 2011-11-02 NOTE — Patient Instructions (Signed)
If you are interested in a shingles/zoster vaccine - call your insurance to check on coverage,( you should not get it within 1 month of other vaccines) , then call us for a prescription  for it to take to a pharmacy that gives the shot   Pneumonia vaccine today We will schedule mammogram and bone density tests at check out  Avoid red meat/ fried foods/ egg yolks/ fatty breakfast meats/ butter, cheese and high fat dairy/ and shellfish  Schedule fasting lab for cholesterol and then follow up in 6 months

## 2011-11-03 NOTE — Assessment & Plan Note (Signed)
Exam and 3 year pap done No problems

## 2011-11-03 NOTE — Assessment & Plan Note (Signed)
Due for 2 year follow up dexa That will be scheduled Rev ca and D and exercise and safety issues

## 2011-11-03 NOTE — Assessment & Plan Note (Signed)
Cholesterol is up  Disc goals for lipids and reasons to control them Rev labs with pt Rev low sat fat diet in detail  Will re check 6 mo and f/u after better diet

## 2011-11-03 NOTE — Assessment & Plan Note (Signed)
Scheduled annual screening mammogram Nl breast exam today  Encouraged monthly self exams   

## 2011-11-09 ENCOUNTER — Encounter: Payer: Self-pay | Admitting: *Deleted

## 2011-11-16 ENCOUNTER — Encounter: Payer: Self-pay | Admitting: Gastroenterology

## 2011-11-16 ENCOUNTER — Ambulatory Visit (INDEPENDENT_AMBULATORY_CARE_PROVIDER_SITE_OTHER): Payer: Medicare Other | Admitting: Gastroenterology

## 2011-11-16 VITALS — BP 90/50 | HR 80 | Ht 63.0 in | Wt 143.4 lb

## 2011-11-16 DIAGNOSIS — R141 Gas pain: Secondary | ICD-10-CM

## 2011-11-16 DIAGNOSIS — K5900 Constipation, unspecified: Secondary | ICD-10-CM

## 2011-11-16 DIAGNOSIS — R140 Abdominal distension (gaseous): Secondary | ICD-10-CM

## 2011-11-16 DIAGNOSIS — R1013 Epigastric pain: Secondary | ICD-10-CM

## 2011-11-16 DIAGNOSIS — R143 Flatulence: Secondary | ICD-10-CM

## 2011-11-16 MED ORDER — MOVIPREP 100 G PO SOLR: 1.0000 | Freq: Once | ORAL | Status: DC

## 2011-11-16 NOTE — Patient Instructions (Signed)
You have been scheduled for a colonoscopy with propofol. Please follow written instructions given to you at your visit today.  Please pick up your prep kit at the pharmacy within the next 1-3 days. You have been given a High Fiber diet.  Increase your water intake daily. If this does not help your constipation then try over the counter Colace daily.  cc: Roxy Manns, MD

## 2011-11-16 NOTE — Progress Notes (Signed)
History of Present Illness: This is a 66 year old female seen in the past. She relates frequent abdominal bloating and epigastric discomfort related to mild constipation her symptoms are relieved following a bowel movement. She generally has a bowel movement about every 2-3 days and this is less frequently than it was several years ago. She has a family history of colon cancer and is overdue for screening colonoscopy. Denies weight loss, diarrhea, change in stool caliber, melena, hematochezia, nausea, vomiting, dysphagia, reflux symptoms, chest pain.  Review of Systems: Pertinent positive and negative review of systems were noted in the above HPI section. All other review of systems were otherwise negative.  Current Medications, Allergies, Past Medical History, Past Surgical History, Family History and Social History were reviewed in [employer] record.  Physical Exam: General: Well developed , well nourished, no acute distress Head: Normocephalic and atraumatic Eyes:  sclerae anicteric, EOMI Ears: Normal auditory acuity Mouth: No deformity or lesions Neck: Supple, no masses or thyromegaly Lungs: Clear throughout to auscultation Heart: Regular rate and rhythm; no murmurs, rubs or bruits Abdomen: Soft, non tender and non distended. No masses, hepatosplenomegaly or hernias noted. Normal Bowel sounds Rectal: Deferred to colonoscopy Musculoskeletal: Symmetrical with no gross deformities  Skin: No lesions on visible extremities Pulses:  Normal pulses noted Extremities: No clubbing, cyanosis, edema or deformities noted Neurological: Alert oriented x 4, grossly nonfocal Cervical Nodes:  No significant cervical adenopathy Inguinal Nodes: No significant inguinal adenopathy Psychological:  Alert and cooperative. Normal mood and affect  Assessment and Recommendations:  1. Abdominal bloating associated with mild epigastric pain and mild constipation. Increase dietary fiber and  water intake. Trial of a stool softener if that is not effective. Schedule colonoscopy. The risks, benefits, and alternatives to colonoscopy with possible biopsy and possible polypectomy were discussed with the patient and they consent to proceed.   2. Family history of colon cancer, father at 40. Colonoscopy as above.

## 2011-11-17 ENCOUNTER — Ambulatory Visit: Payer: Medicare Other

## 2011-11-22 ENCOUNTER — Other Ambulatory Visit: Payer: Medicare Other

## 2011-12-07 ENCOUNTER — Ambulatory Visit
Admission: RE | Admit: 2011-12-07 | Discharge: 2011-12-07 | Disposition: A | Discharge status: Home / Self Care | Payer: Medicare Other | Source: Ambulatory Visit | Attending: Family Medicine | Admitting: Family Medicine

## 2011-12-07 DIAGNOSIS — M949 Disorder of cartilage, unspecified: Secondary | ICD-10-CM

## 2011-12-07 DIAGNOSIS — Z1231 Encounter for screening mammogram for malignant neoplasm of breast: Secondary | ICD-10-CM

## 2011-12-07 DIAGNOSIS — M899 Disorder of bone, unspecified: Secondary | ICD-10-CM

## 2011-12-08 ENCOUNTER — Encounter: Payer: Self-pay | Admitting: *Deleted

## 2011-12-08 ENCOUNTER — Encounter: Payer: Self-pay | Admitting: Gastroenterology

## 2011-12-20 ENCOUNTER — Encounter: Payer: Self-pay | Admitting: Gastroenterology

## 2011-12-20 ENCOUNTER — Ambulatory Visit (AMBULATORY_SURGERY_CENTER): Payer: Medicare Other | Admitting: Gastroenterology

## 2011-12-20 VITALS — BP 124/66 | HR 64 | Temp 96.0°F | Resp 20 | Ht 63.0 in | Wt 143.0 lb

## 2011-12-20 DIAGNOSIS — Z800 Family history of malignant neoplasm of digestive organs: Secondary | ICD-10-CM

## 2011-12-20 DIAGNOSIS — Z1211 Encounter for screening for malignant neoplasm of colon: Secondary | ICD-10-CM

## 2011-12-20 DIAGNOSIS — K5900 Constipation, unspecified: Secondary | ICD-10-CM

## 2011-12-20 DIAGNOSIS — D126 Benign neoplasm of colon, unspecified: Secondary | ICD-10-CM

## 2011-12-20 DIAGNOSIS — R1013 Epigastric pain: Secondary | ICD-10-CM

## 2011-12-20 MED ORDER — SODIUM CHLORIDE 0.9 % IV SOLN: 500.0000 mL | INTRAVENOUS | Status: DC

## 2011-12-20 NOTE — Op Note (Signed)
[facility] [address] [place], [place]  [address]  COLONOSCOPY PROCEDURE REPORT PATIENT:  Diane Delacruz, Diane Delacruz  MR#:  [id number] BIRTHDATE:  03/05/1946, 65 yrs. old  GENDER:  female ENDOSCOPIST:  Judie Petit T. Russella Dar, MD, [facility]  PROCEDURE DATE:  12/20/2011 PROCEDURE:  Colonoscopy with biopsy ASA CLASS:  Class II INDICATIONS:  1) Elevated Risk Screening  2) family history of colon cancer: father MEDICATIONS:   MAC sedation, administered by CRNA, propofol (Diprivan) 280 mg IV DESCRIPTION OF PROCEDURE:   After the risks benefits and alternatives of the procedure were thoroughly explained, informed consent was obtained.  Digital rectal exam was performed and revealed no abnormalities.   The LB [id number] endoscope was introduced through the anus and advanced to the cecum, which was identified by both the appendix and ileocecal valve, without limitations.  The quality of the prep was good, using MoviPrep. The instrument was then slowly withdrawn as the colon was fully examined. <<PROCEDUREIMAGES>> FINDINGS:  Moderate diverticulosis was found in the sigmoid colon. A sessile polyp was found in the cecum. It was 4 mm in size. The polyp was removed using cold biopsy forceps.  Two polyps were found in the ascending colon. They were 4 - 5 mm in size. The polyps were removed using cold biopsy forceps.  Otherwise normal colonoscopy without other polyps, masses, vascular ectasias, or inflammatory changes.   Retroflexed views in the rectum revealed no abnormalities.  The time to cecum =  5.67  minutes. The scope was then withdrawn (time =  10  min) from the patient and the procedure completed.  COMPLICATIONS:  None  ENDOSCOPIC IMPRESSION: 1) Moderate diverticulosis in the sigmoid colon 2) 4 mm sessile polyp in the cecum 3) 4 - 5 mm Two polyps in the ascending colon  RECOMMENDATIONS: 1) Await pathology results 2) High fiber diet with liberal fluid intake. 3) Repeat Colonoscopy  in 5 years.  Venita Lick. Russella Dar, MD, [facility]  n. eSIGNED:   Venita Lick. Fiona Coto at 12/20/2011 11:36 AM  Corinne Ports, [id number]

## 2011-12-20 NOTE — Progress Notes (Signed)
Patient did not experience any of the following events: a burn prior to discharge; a fall within the facility; wrong site/side/patient/procedure/implant event; or a hospital transfer or hospital admission upon discharge from the facility. (G8907) Patient did not have preoperative order for IV antibiotic SSI prophylaxis. (G8918)  

## 2011-12-20 NOTE — Patient Instructions (Signed)
YOU HAD AN ENDOSCOPIC PROCEDURE TODAY AT THE [facility]: Refer to the procedure report that was given to you for any specific questions about what was found during the examination.  If the procedure report does not answer your questions, please call your gastroenterologist to clarify.  If you requested that your care partner not be given the details of your procedure findings, then the procedure report has been included in a sealed envelope for you to review at your convenience later.  YOU SHOULD EXPECT: Some feelings of bloating in the abdomen. Passage of more gas than usual.  Walking can help get rid of the air that was put into your GI tract during the procedure and reduce the bloating. If you had a lower endoscopy (such as a colonoscopy or flexible sigmoidoscopy) you may notice spotting of blood in your stool or on the toilet paper. If you underwent a bowel prep for your procedure, then you may not have a normal bowel movement for a few days.  DIET: Your first meal following the procedure should be a light meal and then it is ok to progress to your normal diet.  A half-sandwich or bowl of soup is an example of a good first meal.  Heavy or fried foods are harder to digest and may make you feel nauseous or bloated.  Likewise meals heavy in dairy and vegetables can cause extra gas to form and this can also increase the bloating.  Drink plenty of fluids but you should avoid alcoholic beverages for 24 hours.  ACTIVITY: Your care partner should take you home directly after the procedure.  You should plan to take it easy, moving slowly for the rest of the day.  You can resume normal activity the day after the procedure however you should NOT DRIVE or use heavy machinery for 24 hours (because of the sedation medicines used during the test).    SYMPTOMS TO REPORT IMMEDIATELY: A gastroenterologist can be reached at any hour.  During normal business hours, 8:30 AM to 5:00 PM Monday through Friday,  call [phone].  After hours and on weekends, please call the GI answering service at [phone] who will take a message and have the physician on call contact you.   Following lower endoscopy (colonoscopy or flexible sigmoidoscopy):  Excessive amounts of blood in the stool  Significant tenderness or worsening of abdominal pains  Swelling of the abdomen that is new, acute  Fever of 100F or higher    FOLLOW UP: If any biopsies were taken you will be contacted by phone or by letter within the next 1-3 weeks.  Call your gastroenterologist if you have not heard about the biopsies in 3 weeks.  Our staff will call the home number listed on your records the next business day following your procedure to check on you and address any questions or concerns that you may have at that time regarding the information given to you following your procedure. This is a courtesy call and so if there is no answer at the home number and we have not heard from you through the emergency physician on call, we will assume that you have returned to your regular daily activities without incident.  SIGNATURES/CONFIDENTIALITY: You and/or your care partner have signed paperwork which will be entered into your electronic medical record.  These signatures attest to the fact that that the information above on your After Visit Summary has been reviewed and is understood.  Full responsibility of the confidentiality   of this discharge information lies with you and/or your care-partner.     

## 2011-12-21 ENCOUNTER — Telehealth: Payer: Self-pay

## 2011-12-21 NOTE — Telephone Encounter (Signed)
  Follow up Call-  Call back number 12/20/2011  Post procedure Call Back phone  # [phone]  Permission to leave phone message Yes     Patient questions:  Do you have a fever, pain , or abdominal swelling? no Pain Score  0 *  Have you tolerated food without any problems? yes  Have you been able to return to your normal activities? yes  Do you have any questions about your discharge instructions: Diet   no Medications  no Follow up visit  no  Do you have questions or concerns about your Care? no  Actions: * If pain score is 4 or above: No action needed, pain <4.  Per the pt "everything went real well". Maw

## 2011-12-22 ENCOUNTER — Encounter: Payer: Self-pay | Admitting: Gastroenterology

## 2012-02-01 ENCOUNTER — Other Ambulatory Visit: Payer: Self-pay | Admitting: Family Medicine

## 2012-02-02 ENCOUNTER — Other Ambulatory Visit: Payer: Self-pay

## 2012-02-02 MED ORDER — DULOXETINE HCL 30 MG PO CPEP: 30.0000 mg | ORAL_CAPSULE | Freq: Every day | ORAL | Status: DC

## 2012-02-02 NOTE — Telephone Encounter (Signed)
Will refill electronically  

## 2012-02-02 NOTE — Telephone Encounter (Signed)
Last OV was 11/02/11. Ok to refill?

## 2012-02-03 ENCOUNTER — Other Ambulatory Visit: Payer: Self-pay | Admitting: Family Medicine

## 2012-02-09 ENCOUNTER — Other Ambulatory Visit: Payer: Self-pay

## 2012-02-24 ENCOUNTER — Ambulatory Visit (INDEPENDENT_AMBULATORY_CARE_PROVIDER_SITE_OTHER): Payer: Medicare Other | Admitting: Family Medicine

## 2012-02-24 ENCOUNTER — Encounter: Payer: Self-pay | Admitting: Family Medicine

## 2012-02-24 VITALS — BP 116/68 | HR 77 | Temp 97.7°F | Ht 63.0 in | Wt 145.0 lb

## 2012-02-24 DIAGNOSIS — M949 Disorder of cartilage, unspecified: Secondary | ICD-10-CM

## 2012-02-24 DIAGNOSIS — M899 Disorder of bone, unspecified: Secondary | ICD-10-CM

## 2012-02-24 DIAGNOSIS — M26609 Unspecified temporomandibular joint disorder, unspecified side: Secondary | ICD-10-CM | POA: Insufficient documentation

## 2012-02-24 DIAGNOSIS — E785 Hyperlipidemia, unspecified: Secondary | ICD-10-CM

## 2012-02-24 NOTE — Assessment & Plan Note (Signed)
Disc most recent dexa with T of -2 in LS  evista would be my first choice in light of GERD Ca andD rev Exercising and no fx  Will do research on med and disc further at f/u in fall Handout given

## 2012-02-24 NOTE — Progress Notes (Signed)
Subjective:    Patient ID: Diane Delacruz, female    DOB: 05-Sep-1945, 66 y.o.   MRN: [id number]  HPI  Jaw pain - going on about a month- gets better and worse  Now whenever she talks or chews - grinding noise on L side - pain is not severe Rad to ear   ? TMJ  Not a tooth grinder  Does not think at night  ? If clenches She has dentures   Does not chew gum  Also osteopenia last dexa T score -2 LS  No fractures  On ca and vit D  D level is ok  Does exercise some - getting back to walking on the treadmill     Patient Active Problem List  Diagnosis  . HYPOGLYCEMIA, UNSPECIFIED  . VITAMIN D DEFICIENCY  . DEPRESSION  . VARICOSE VEINS, LOWER EXTREMITIES  . ALLERGIC RHINITIS, SEASONAL  . GERD  . IRRITABLE BOWEL SYNDROME  . FIBROCYSTIC BREAST DISEASE  . POSTMENOPAUSAL STATUS  . BACK PAIN, LUMBAR  . OSTEOPENIA  . PALPITATIONS  . Family history of colon cancer  . Hyperlipidemia  . Syncope  . Insomnia  . Abdominal bloating   Past Medical History  Diagnosis Date  . Depression   . CAD (coronary artery disease)   . Palpitations 03/06/09    event monitor  . Unspecified vitamin D deficiency   . Hypoglycemia, unspecified   . Lumbago   . Irritable bowel syndrome   . Unspecified adverse effect of other drug, medicinal and biological substance   . Asymptomatic varicose veins   . Pure hypercholesterolemia   . Diffuse cystic mastopathy   . Allergic rhinitis due to pollen   . Symptomatic menopausal or female climacteric states   . Esophageal reflux   . Dyspnea 2008    negative echo and MV scan  . DDD (degenerative disc disease)     in neck, neurosurgeon Dr. Dutch Quint  . Stress fracture of foot 03/28/99    left  . Bladder tumor 11/05  . Diverticulosis   . History of gallstones    Past Surgical History  Procedure Date  . Cholecystectomy     gallstones- ultrasound 10/03  . Bladder surgery     tumor removed   History  Substance Use Topics  . Smoking status: Never  Smoker   . Smokeless tobacco: Never Used  . Alcohol Use: Yes     Rare-wine   Family History  Problem Relation Age of Onset  . Hyperlipidemia Mother   . Stroke Mother   . Arthritis Mother   . Macular degeneration Mother   . Irritable bowel syndrome Mother   . Colon cancer Father   . Heart disease Father     MI's  . COPD Sister     heavy smoker  . Colon polyps Maternal Aunt   . Breast cancer Paternal Aunt   . Osteopenia Sister   . Other Daughter     celiac spruce  . Diabetes Brother   . Diabetes Paternal Uncle   . Irritable bowel syndrome Sister   . Kidney disease Cousin   . Rectal cancer Neg Hx   . Stomach cancer Neg Hx    Allergies  Allergen Reactions  . Codeine     REACTION: unknown reaction  . Klonopin (Clonazepam)     Dizziness , fatigue  . Nsaids     REACTION: diarrhea  . Omeprazole     REACTION: did not work for her  . Pseudoephedrine  REACTION: jittery   Current Outpatient Prescriptions on File Prior to Visit  Medication Sig Dispense Refill  . acetaminophen (TYLENOL) 500 MG tablet Take 500 mg by mouth as needed.      . Calcium Carbonate-Vitamin D (CALCIUM 600-D) 600-400 MG-UNIT per tablet Take 1 tablet by mouth daily.        . Cholecalciferol (VITAMIN D) 2000 UNITS CAPS 1 capsule by mouth daily       . DULoxetine (CYMBALTA) 30 MG capsule Take 1 capsule (30 mg total) by mouth daily.  30 capsule  11  . fexofenadine (ALLEGRA) 60 MG tablet Take 180 mg by mouth daily as needed.       . mometasone (NASONEX) 50 MCG/ACT nasal spray Place 2 sprays into the nose daily.  17 g  11  . ranitidine (ZANTAC) 150 MG capsule Take 1 capsule (150 mg total) by mouth 2 (two) times daily.  60 capsule  11  . zolpidem (AMBIEN) 5 MG tablet Take 1 tablet (5 mg total) by mouth at bedtime as needed.  30 tablet  0  . zaleplon (SONATA) 5 MG capsule Take 1 capsule (5 mg total) by mouth at bedtime.  30 capsule  1      Review of Systems    Review of Systems  Constitutional: Negative  for fever, appetite change, fatigue and unexpected weight change.  Eyes: Negative for pain and visual disturbance.  ENT pos for L jaw pain and clicking,neg for swellling or redness, neg for ST or ear dc Respiratory: Negative for cough and shortness of breath.   Cardiovascular: Negative for cp or palpitations    Gastrointestinal: Negative for nausea, diarrhea and constipation.  Genitourinary: Negative for urgency and frequency.  Skin: Negative for pallor or rash   Neurological: Negative for weakness, light-headedness, numbness and headaches.  Hematological: Negative for adenopathy. Does not bruise/bleed easily.  Psychiatric/Behavioral: Negative for dysphoric mood. The patient is not nervous/anxious.      Objective:   Physical Exam  Constitutional: She appears well-developed and well-nourished. No distress.  HENT:  Head: Normocephalic and atraumatic.  Right Ear: External ear normal.  Left Ear: External ear normal.  Nose: Nose normal.  Mouth/Throat: Oropharynx is clear and moist.       L TM joint is tender with some mild clicking and crepitus  No locking No swelling or redness  Pt is limited from opening mouth very wide  No temporal tenderness  Eyes: Conjunctivae and EOM are normal. Pupils are equal, round, and reactive to light. No scleral icterus.  Neck: Normal range of motion. Neck supple. No JVD present. Carotid bruit is not present. No thyromegaly present.  Cardiovascular: Normal rate, regular rhythm and normal heart sounds.   Pulmonary/Chest: Effort normal and breath sounds normal.  Abdominal: She exhibits no abdominal bruit.  Musculoskeletal: She exhibits no edema.       No kyphosis  Lymphadenopathy:    She has no cervical adenopathy.  Neurological: She is alert. She has normal reflexes.  Skin: Skin is warm and dry. No rash noted. No erythema. No pallor.  Psychiatric: She has a normal mood and affect.          Assessment & Plan:

## 2012-02-24 NOTE — Assessment & Plan Note (Signed)
See AVS for advice re: consv tx  If no imp with soft foods/ ice compress-will go to dentist for further eval

## 2012-02-24 NOTE — Patient Instructions (Addendum)
Avoid opening mouth wide Eat soft foods - 1-2 weeks to give your jaw a rest  Use a straw for all liquids  Use ice - 10 minutes at a time as often as you can  If not improved in 1-2 weeks- visit your dentist  For osteopenia - please take ca and D and also exercise  Schedule fasting lab in fall sept/ oct and then follow up  Look into [facility] for treatment and let me know what you think when you return

## 2012-04-17 ENCOUNTER — Encounter: Payer: Self-pay | Admitting: Family Medicine

## 2012-04-17 ENCOUNTER — Ambulatory Visit (INDEPENDENT_AMBULATORY_CARE_PROVIDER_SITE_OTHER): Payer: Medicare Other | Admitting: Family Medicine

## 2012-04-17 VITALS — BP 122/64 | HR 72 | Temp 98.2°F | Ht 62.0 in | Wt 144.8 lb

## 2012-04-17 DIAGNOSIS — R109 Unspecified abdominal pain: Secondary | ICD-10-CM

## 2012-04-17 DIAGNOSIS — R10A0 Flank pain, unspecified side: Secondary | ICD-10-CM

## 2012-04-17 DIAGNOSIS — N390 Urinary tract infection, site not specified: Secondary | ICD-10-CM

## 2012-04-17 LAB — POCT URINALYSIS DIPSTICK
Ketones, UA: NEGATIVE
Spec Grav, UA: 1.015
Urobilinogen, UA: 0.2

## 2012-04-17 MED ORDER — PROMETHAZINE HCL 25 MG PO TABS: 25.0000 mg | ORAL_TABLET | Freq: Three times a day (TID) | ORAL | Status: DC | PRN

## 2012-04-17 MED ORDER — CIPROFLOXACIN HCL 500 MG PO TABS: 500.0000 mg | ORAL_TABLET | Freq: Two times a day (BID) | ORAL | Status: DC

## 2012-04-17 NOTE — Progress Notes (Signed)
Subjective:    Patient ID: Diane Delacruz, female    DOB: [birth year], 66 y.o.   MRN: [id number]  HPI Here with multiple symptoms - started about a week ago   Started with stomach pain and L upper quad pain and in L flank to the back  With nausea but no vomiting  No fever  A little diarrhea  colonosc was this summer - had some polyps and diverticulosis   Worse when she eats  No urinary changes  No blood in urine  Never had kidney stones or family hx    Headache also  Started same time - still has it  Not bad just dull and persistant all over her head  No sinus symptoms or cough  Patient Active Problem List  Diagnosis  . HYPOGLYCEMIA, UNSPECIFIED  . VITAMIN D DEFICIENCY  . DEPRESSION  . VARICOSE VEINS, LOWER EXTREMITIES  . ALLERGIC RHINITIS, SEASONAL  . GERD  . IRRITABLE BOWEL SYNDROME  . FIBROCYSTIC BREAST DISEASE  . POSTMENOPAUSAL STATUS  . BACK PAIN, LUMBAR  . OSTEOPENIA  . PALPITATIONS  . Family history of colon cancer  . Hyperlipidemia  . Syncope  . Insomnia  . Abdominal bloating  . TMJ (temporomandibular joint syndrome)  . Flank pain  . UTI (lower urinary tract infection)   Past Medical History  Diagnosis Date  . Depression   . CAD (coronary artery disease)   . Palpitations 03/06/09    event monitor  . Unspecified vitamin D deficiency   . Hypoglycemia, unspecified   . Lumbago   . Irritable bowel syndrome   . Unspecified adverse effect of other drug, medicinal and biological substance   . Asymptomatic varicose veins   . Pure hypercholesterolemia   . Diffuse cystic mastopathy   . Allergic rhinitis due to pollen   . Symptomatic menopausal or female climacteric states   . Esophageal reflux   . Dyspnea 2008    negative echo and MV scan  . DDD (degenerative disc disease)     in neck, neurosurgeon Dr. Dutch Quint  . Stress fracture of foot 03/28/99    left  . Bladder tumor 11/05  . Diverticulosis   . History of gallstones    Past Surgical History    Procedure Date  . Cholecystectomy     gallstones- ultrasound 10/03  . Bladder surgery     tumor removed   History  Substance Use Topics  . Smoking status: Never Smoker   . Smokeless tobacco: Never Used  . Alcohol Use: Yes     Rare-wine   Family History  Problem Relation Age of Onset  . Hyperlipidemia Mother   . Stroke Mother   . Arthritis Mother   . Macular degeneration Mother   . Irritable bowel syndrome Mother   . Colon cancer Father   . Heart disease Father     MI's  . COPD Sister     heavy smoker  . Colon polyps Maternal Aunt   . Breast cancer Paternal Aunt   . Osteopenia Sister   . Other Daughter     celiac spruce  . Diabetes Brother   . Diabetes Paternal Uncle   . Irritable bowel syndrome Sister   . Kidney disease Cousin   . Rectal cancer Neg Hx   . Stomach cancer Neg Hx    Allergies  Allergen Reactions  . Codeine     REACTION: unknown reaction  . Klonopin (Clonazepam)     Dizziness , fatigue  . Nsaids  REACTION: diarrhea  . Omeprazole     REACTION: did not work for her  . Pseudoephedrine     REACTION: jittery   Current Outpatient Prescriptions on File Prior to Visit  Medication Sig Dispense Refill  . acetaminophen (TYLENOL) 500 MG tablet Take 500 mg by mouth as needed.      . Calcium Carbonate-Vitamin D (CALCIUM 600-D) 600-400 MG-UNIT per tablet Take 1 tablet by mouth daily.        . Cholecalciferol (VITAMIN D) 2000 UNITS CAPS 1 capsule by mouth daily       . DULoxetine (CYMBALTA) 30 MG capsule Take 1 capsule (30 mg total) by mouth daily.  30 capsule  11  . fexofenadine (ALLEGRA) 60 MG tablet Take 180 mg by mouth daily as needed.       . mometasone (NASONEX) 50 MCG/ACT nasal spray Place 2 sprays into the nose daily.  17 g  11  . ranitidine (ZANTAC) 150 MG capsule Take 1 capsule (150 mg total) by mouth 2 (two) times daily.  60 capsule  11  . zolpidem (AMBIEN) 5 MG tablet Take 1 tablet (5 mg total) by mouth at bedtime as needed.  30 tablet  0  .  promethazine (PHENERGAN) 25 MG tablet Take 1 tablet (25 mg total) by mouth every 8 (eight) hours as needed for nausea.  15 tablet  0  . zaleplon (SONATA) 5 MG capsule Take 1 capsule (5 mg total) by mouth at bedtime.  30 capsule  1       Review of Systems Review of Systems  Constitutional: Negative for fever, appetite change, fatigue and unexpected weight change.  Eyes: Negative for pain and visual disturbance.  Respiratory: Negative for cough and shortness of breath.   Cardiovascular: Negative for cp or palpitations    Gastrointestinal: Negative for diarrhea and constipation. neg for blood in stool or dark colored stools Genitourinary: Negative for urgency and frequency. neg for blood in urine Skin: Negative for pallor or rash   Neurological: Negative for weakness, light-headedness, numbness  Hematological: Negative for adenopathy. Does not bruise/bleed easily.  Psychiatric/Behavioral: Negative for dysphoric mood. The patient is not nervous/anxious.         Objective:   Physical Exam  Constitutional: She appears well-developed and well-nourished. No distress.  HENT:  Head: Normocephalic and atraumatic.  Mouth/Throat: Oropharynx is clear and moist.       No sinus tenderness  Eyes: Conjunctivae normal and EOM are normal. Pupils are equal, round, and reactive to light. Right eye exhibits no discharge. Left eye exhibits no discharge.  Neck: Normal range of motion. Neck supple. No JVD present. Carotid bruit is not present. No thyromegaly present.  Cardiovascular: Normal rate, regular rhythm, normal heart sounds and intact distal pulses.  Exam reveals no gallop.   Pulmonary/Chest: Effort normal and breath sounds normal. No respiratory distress. She has no wheezes.  Abdominal: Soft. Bowel sounds are normal. She exhibits no distension, no abdominal bruit and no mass. There is tenderness. There is no rebound and no guarding.       Some LUQ and L flank tenderness  No rebound or gaurding No  rigidity No pain on shaking the table    Musculoskeletal:       Mild L cva tenderness  Lymphadenopathy:    She has no cervical adenopathy.  Neurological: She is alert. She has normal reflexes.  Skin: Skin is warm and dry. No rash noted. No erythema. No pallor.  Psychiatric: She has a normal  mood and affect.          Assessment & Plan:

## 2012-04-17 NOTE — Assessment & Plan Note (Signed)
With abn ua - differential incl uti/ pyelo or kidney stone ucx Cover with cipro  If no imp or neg cx- will get CT  Pt will update if worse or not imp

## 2012-04-17 NOTE — Assessment & Plan Note (Signed)
Pos ua with L flank pain  cx urine  tx with cipro Phenergan for nausea  If no imp-consider CT for poss stone

## 2012-04-17 NOTE — Patient Instructions (Addendum)
Drink lots of water  Take the cipro as directed Update if not starting to improve in several days  or if worsening   We will culture your urine and alert you of results  Try phenergan for nausea if needed

## 2012-04-19 LAB — URINE CULTURE: Colony Count: NO GROWTH

## 2012-04-21 LAB — POCT UA - MICROSCOPIC ONLY

## 2012-04-27 ENCOUNTER — Other Ambulatory Visit: Payer: Medicare Other

## 2012-04-30 ENCOUNTER — Other Ambulatory Visit (INDEPENDENT_AMBULATORY_CARE_PROVIDER_SITE_OTHER): Payer: Medicare Other

## 2012-04-30 DIAGNOSIS — E785 Hyperlipidemia, unspecified: Secondary | ICD-10-CM

## 2012-04-30 LAB — LIPID PANEL
HDL: 73.6 mg/dL (ref >39.00)
Total CHOL/HDL Ratio: 3
Triglycerides: 74 mg/dL (ref 0.0–149.0)

## 2012-04-30 LAB — LDL CHOLESTEROL, DIRECT: Direct LDL: 170.5 mg/dL

## 2012-05-04 ENCOUNTER — Encounter: Payer: Self-pay | Admitting: Family Medicine

## 2012-05-04 ENCOUNTER — Ambulatory Visit (INDEPENDENT_AMBULATORY_CARE_PROVIDER_SITE_OTHER): Payer: Medicare Other | Admitting: Family Medicine

## 2012-05-04 VITALS — BP 102/58 | HR 78 | Temp 98.2°F | Ht 62.0 in | Wt 145.5 lb

## 2012-05-04 DIAGNOSIS — E785 Hyperlipidemia, unspecified: Secondary | ICD-10-CM

## 2012-05-04 DIAGNOSIS — K219 Gastro-esophageal reflux disease without esophagitis: Secondary | ICD-10-CM

## 2012-05-04 DIAGNOSIS — R110 Nausea: Secondary | ICD-10-CM | POA: Insufficient documentation

## 2012-05-04 MED ORDER — PRAVASTATIN SODIUM 20 MG PO TABS: 20.0000 mg | ORAL_TABLET | Freq: Every day | ORAL | Status: DC

## 2012-05-04 NOTE — Assessment & Plan Note (Signed)
Ongoing intermittent on H2 blocker occ problems swallowing pills  Now more gastritis symptoms and nausea - ref to GI

## 2012-05-04 NOTE — Assessment & Plan Note (Signed)
Ongoing with epigastric and L upper abd pain at times Has had ccy Ref to GI for further eval

## 2012-05-04 NOTE — Progress Notes (Signed)
Subjective:    Patient ID: Diane Delacruz, female    DOB: [birth year], 66 y.o.   MRN: [id number]  HPI Here for f/u of hyperlipidemia   Not 100% better from last time  Stays nauseated a lot - and does not know why -no matter if she eats or not  No R sided pain Pain is better, however  No imp with zantac   colonosc was in June     Lab Results  Component Value Date   CHOL 247* 04/30/2012   CHOL 246* 10/31/2011   CHOL 213* 12/01/2010   Lab Results  Component Value Date   HDL 73.60 04/30/2012   HDL 78.20 10/31/2011   HDL 40.98 12/01/2010   No results found for this basename: LDLCALC   Lab Results  Component Value Date   TRIG 74.0 04/30/2012   TRIG 64.0 10/31/2011   TRIG 77.0 12/01/2010   Lab Results  Component Value Date   CHOLHDL 3 04/30/2012   CHOLHDL 3 10/31/2011   CHOLHDL 3 12/01/2010   Lab Results  Component Value Date   LDLDIRECT 170.5 04/30/2012   LDLDIRECT 144.6 10/31/2011   LDLDIRECT 137.0 12/01/2010    Diet controlled - is not eating the way she should  Lately more meat than usual , no breakfast meats  Eating more hamburger than usual  No fried foods occ fast food with french fries - not often , was eating ice cream daily for a while  Eats margarine , not much better- uses smart balance  occ shellfish  ? Is this genetic= has high cholesterol in family   Lab Results  Component Value Date   ALT 18 10/31/2011   AST 22 10/31/2011   ALKPHOS 84 10/31/2011   BILITOT 0.6 10/31/2011      Patient Active Problem List  Diagnosis  . HYPOGLYCEMIA, UNSPECIFIED  . VITAMIN D DEFICIENCY  . DEPRESSION  . VARICOSE VEINS, LOWER EXTREMITIES  . ALLERGIC RHINITIS, SEASONAL  . GERD  . IRRITABLE BOWEL SYNDROME  . FIBROCYSTIC BREAST DISEASE  . POSTMENOPAUSAL STATUS  . BACK PAIN, LUMBAR  . OSTEOPENIA  . PALPITATIONS  . Family history of colon cancer  . Hyperlipidemia  . Syncope  . Insomnia  . Abdominal bloating  . TMJ (temporomandibular joint syndrome)  . Flank pain    . UTI (lower urinary tract infection)  . Nausea   Past Medical History  Diagnosis Date  . Depression   . CAD (coronary artery disease)   . Palpitations 03/06/09    event monitor  . Unspecified vitamin D deficiency   . Hypoglycemia, unspecified   . Lumbago   . Irritable bowel syndrome   . Unspecified adverse effect of other drug, medicinal and biological substance(995.29)   . Asymptomatic varicose veins   . Pure hypercholesterolemia   . Diffuse cystic mastopathy   . Allergic rhinitis due to pollen   . Symptomatic menopausal or female climacteric states   . Esophageal reflux   . Dyspnea 2008    negative echo and MV scan  . DDD (degenerative disc disease)     in neck, neurosurgeon Dr. Dutch Quint  . Stress fracture of foot 03/28/99    left  . Bladder tumor 11/05  . Diverticulosis   . History of gallstones    Past Surgical History  Procedure Date  . Cholecystectomy     gallstones- ultrasound 10/03  . Bladder surgery     tumor removed   History  Substance Use Topics  . Smoking status:  Never Smoker   . Smokeless tobacco: Never Used  . Alcohol Use: Yes     Rare-wine   Family History  Problem Relation Age of Onset  . Hyperlipidemia Mother   . Stroke Mother   . Arthritis Mother   . Macular degeneration Mother   . Irritable bowel syndrome Mother   . Colon cancer Father   . Heart disease Father     MI's  . COPD Sister     heavy smoker  . Colon polyps Maternal Aunt   . Breast cancer Paternal Aunt   . Osteopenia Sister   . Other Daughter     celiac spruce  . Diabetes Brother   . Diabetes Paternal Uncle   . Irritable bowel syndrome Sister   . Kidney disease Cousin   . Rectal cancer Neg Hx   . Stomach cancer Neg Hx    Allergies  Allergen Reactions  . Codeine     REACTION: unknown reaction  . Klonopin (Clonazepam)     Dizziness , fatigue  . Nsaids     REACTION: diarrhea  . Omeprazole     REACTION: did not work for her  . Pseudoephedrine     REACTION: jittery    Current Outpatient Prescriptions on File Prior to Visit  Medication Sig Dispense Refill  . acetaminophen (TYLENOL) 500 MG tablet Take 500 mg by mouth as needed.      . Calcium Carbonate-Vitamin D (CALCIUM 600-D) 600-400 MG-UNIT per tablet Take 1 tablet by mouth daily.        . Cholecalciferol (VITAMIN D) 2000 UNITS CAPS 1 capsule by mouth daily       . DULoxetine (CYMBALTA) 30 MG capsule Take 1 capsule (30 mg total) by mouth daily.  30 capsule  11  . fexofenadine (ALLEGRA) 60 MG tablet Take 180 mg by mouth daily as needed.       . mometasone (NASONEX) 50 MCG/ACT nasal spray Place 2 sprays into the nose daily.  17 g  11  . promethazine (PHENERGAN) 25 MG tablet Take 1 tablet (25 mg total) by mouth every 8 (eight) hours as needed for nausea.  15 tablet  0  . ranitidine (ZANTAC) 150 MG capsule Take 1 capsule (150 mg total) by mouth 2 (two) times daily.  60 capsule  11  . zolpidem (AMBIEN) 5 MG tablet Take 1 tablet (5 mg total) by mouth at bedtime as needed.  30 tablet  0  . pravastatin (PRAVACHOL) 20 MG tablet Take 1 tablet (20 mg total) by mouth daily.  30 tablet  5  . zaleplon (SONATA) 5 MG capsule Take 1 capsule (5 mg total) by mouth at bedtime.  30 capsule  1    Review of Systems Review of Systems  Constitutional: Negative for fever, appetite change, fatigue and unexpected weight change.  Eyes: Negative for pain and visual disturbance.  Respiratory: Negative for cough and shortness of breath.   Cardiovascular: Negative for cp or palpitations    Gastrointestinal: Negative for diarrhea/ constipation / blood in stool or dark colored stool Genitourinary: Negative for urgency and frequency.  Skin: Negative for pallor or rash   Neurological: Negative for weakness, light-headedness, numbness and headaches.  Hematological: Negative for adenopathy. Does not bruise/bleed easily.  Psychiatric/Behavioral: Negative for dysphoric mood. The patient is not nervous/anxious.         Objective:    Physical Exam  Constitutional: She appears well-developed and well-nourished. No distress.  HENT:  Head: Normocephalic and atraumatic.  Mouth/Throat:  Oropharynx is clear and moist.  Eyes: Conjunctivae normal and EOM are normal. Pupils are equal, round, and reactive to light. No scleral icterus.  Neck: Normal range of motion. Neck supple. No JVD present. Carotid bruit is not present. No thyromegaly present.  Cardiovascular: Normal rate, regular rhythm, normal heart sounds and intact distal pulses.  Exam reveals no gallop.   Pulmonary/Chest: Effort normal and breath sounds normal. No respiratory distress. She has no wheezes.  Abdominal: Soft. Bowel sounds are normal. She exhibits no distension, no abdominal bruit and no mass. There is tenderness in the epigastric area. There is no rebound and no guarding.       Mild epigastric and LUQ tenderness  Musculoskeletal: She exhibits no edema.  Lymphadenopathy:    She has no cervical adenopathy.  Neurological: She is alert. She has normal reflexes.  Skin: Skin is warm and dry. No rash noted. No erythema. No pallor.  Psychiatric: She has a normal mood and affect.          Assessment & Plan:

## 2012-05-04 NOTE — Patient Instructions (Addendum)
Avoid red meat/ fried foods/ egg yolks/ fatty breakfast meats/ butter, cheese and high fat dairy/ and shellfish  We will do referral for GI at check out  Schedule fasting lab in 6 weeks for cholesterol  Start the pravastatin and update me if any problems

## 2012-05-04 NOTE — Assessment & Plan Note (Signed)
Increased LDL  Disc goals for lipids and reasons to control them Rev labs with pt Rev low sat fat diet in detail Start pravastatin 20 mg (will inc to 40 if necessary) Handouts given Fasting lab in 6 wk

## 2012-05-08 ENCOUNTER — Encounter: Payer: Self-pay | Admitting: Gastroenterology

## 2012-05-08 ENCOUNTER — Ambulatory Visit (INDEPENDENT_AMBULATORY_CARE_PROVIDER_SITE_OTHER): Payer: Medicare Other | Admitting: Gastroenterology

## 2012-05-08 ENCOUNTER — Telehealth: Payer: Self-pay | Admitting: *Deleted

## 2012-05-08 VITALS — BP 90/60 | HR 72 | Ht 62.0 in | Wt 145.6 lb

## 2012-05-08 DIAGNOSIS — K219 Gastro-esophageal reflux disease without esophagitis: Secondary | ICD-10-CM

## 2012-05-08 DIAGNOSIS — Z86010 Personal history of colonic polyps: Secondary | ICD-10-CM

## 2012-05-08 DIAGNOSIS — R110 Nausea: Secondary | ICD-10-CM

## 2012-05-08 MED ORDER — DEXLANSOPRAZOLE 60 MG PO CPDR: 60.0000 mg | DELAYED_RELEASE_CAPSULE | Freq: Every day | ORAL | Status: DC

## 2012-05-08 NOTE — Progress Notes (Signed)
History of Present Illness: This is a 66 year old female who has had many years of reflux symptoms generally brought on by meals and recumbency. Her symptoms are associated with epigastric pain, substernal burning and postprandial nausea. She has occasional regurgitation. She states she has tried Prilosec and Prevacid without control of her symptoms. She is recently taking Zantac 150 mg twice a day again without control of her symptoms. She occasionally has difficulty swallowing both solids and liquids. Denies weight loss, constipation, diarrhea, change in stool caliber, melena, hematochezia, vomiting, chest pain.  Current Medications, Allergies, Past Medical History, Past Surgical History, Family History and Social History were reviewed in [employer] record.  Physical Exam: General: Well developed , well nourished, no acute distress Head: Normocephalic and atraumatic Eyes:  sclerae anicteric, EOMI Ears: Normal auditory acuity Mouth: No deformity or lesions Lungs: Clear throughout to auscultation Heart: Regular rate and rhythm; no murmurs, rubs or bruits Abdomen: Soft, non tender and non distended. No masses, hepatosplenomegaly or hernias noted. Normal Bowel sounds Musculoskeletal: Symmetrical with no gross deformities  Pulses:  Normal pulses noted Extremities: No clubbing, cyanosis, edema or deformities noted Neurological: Alert oriented x 4, grossly nonfocal Psychological:  Alert and cooperative. Normal mood and affect  Assessment and Recommendations:  1. GERD, solid and liquid dysphagia and nausea. Discontinue ranitidine and begin standard antireflux measures. Begin Dexilant 60 mg daily. Schedule upper endoscopy. The risks, benefits, and alternatives to endoscopy with possible biopsy and possible dilation were discussed with the patient and they consent to proceed.   2. Personal history of adenomatous colon polyps, family history of colon cancer in her father.  Surveillance colonoscopy recommended June 2018.

## 2012-05-08 NOTE — Telephone Encounter (Signed)
Diane Delacruz-- perhaps we could get her in with an extender at GI ? See note thanks

## 2012-05-08 NOTE — Patient Instructions (Addendum)
You have been scheduled for an endoscopy with propofol. Please follow written instructions given to you at your visit today. If you use inhalers (even only as needed), please bring them with you on the day of your procedure.  You have been given samples of Dexilant to take one tablet by mouth once daily in place of your ranitidine.

## 2012-05-08 NOTE — Telephone Encounter (Signed)
Actually the appt was made for today 05/08/12 at 11:15 with Dr Russella Dar and Stanton Kidney who set this up had a typo on the patients paper that said November 22nd instead of October 22nd. I called patient  To let her know what happened and she will go to the appt today at 11am with Dr Russella Dar.

## 2012-05-08 NOTE — Telephone Encounter (Signed)
Great - thanks

## 2012-05-08 NOTE — Telephone Encounter (Signed)
Pt was called with appt by Dr Anselm Jungling office which is set up for 11/2, she says she doesn't think she can wait that long for an appointment her stomach is really bothering her, wants to know if we can get her with anyone sooner, it does not have to be within the [facility] system.

## 2012-05-09 ENCOUNTER — Ambulatory Visit (AMBULATORY_SURGERY_CENTER): Payer: Medicare Other | Admitting: Gastroenterology

## 2012-05-09 ENCOUNTER — Encounter: Payer: Self-pay | Admitting: Gastroenterology

## 2012-05-09 VITALS — BP 122/85 | HR 73 | Temp 97.7°F | Resp 20 | Ht 62.0 in | Wt 145.0 lb

## 2012-05-09 DIAGNOSIS — K2960 Other gastritis without bleeding: Secondary | ICD-10-CM

## 2012-05-09 DIAGNOSIS — R110 Nausea: Secondary | ICD-10-CM

## 2012-05-09 DIAGNOSIS — K2970 Gastritis, unspecified, without bleeding: Secondary | ICD-10-CM

## 2012-05-09 DIAGNOSIS — K219 Gastro-esophageal reflux disease without esophagitis: Secondary | ICD-10-CM

## 2012-05-09 DIAGNOSIS — K2990 Gastroduodenitis, unspecified, without bleeding: Secondary | ICD-10-CM

## 2012-05-09 MED ORDER — SODIUM CHLORIDE 0.9 % IV SOLN: 500.0000 mL | INTRAVENOUS | Status: DC

## 2012-05-09 NOTE — Progress Notes (Signed)
Patient did not experience any of the following events: a burn prior to discharge; a fall within the facility; wrong site/side/patient/procedure/implant event; or a hospital transfer or hospital admission upon discharge from the facility. (G8907) Patient did not have preoperative order for IV antibiotic SSI prophylaxis. (G8918)  

## 2012-05-09 NOTE — Op Note (Signed)
[facility] [address] [place], [address]   ENDOSCOPY PROCEDURE REPORT  PATIENT: Diane Delacruz, Diane Delacruz  MR#: [id number] BIRTHDATE: 11-25-1945 , 65  yrs. old GENDER: Female ENDOSCOPIST: Meryl Dare, MD, [facility] PROCEDURE DATE:  05/09/2012 PROCEDURE:  EGD w/ biopsy ASA CLASS:     Class II INDICATIONS:  history of GERD. MEDICATIONS: MAC sedation, administered by CRNA and propofol (Diprivan) 100mg  IV TOPICAL ANESTHETIC: Cetacaine Spray DESCRIPTION OF PROCEDURE: After the risks benefits and alternatives of the procedure were thoroughly explained, informed consent was obtained.  The [facility] [id number] endoscope was introduced through the mouth and advanced to the second portion of the duodenum. Without limitations.  The instrument was slowly withdrawn as the mucosa was fully examined.   STOMACH: Mild gastritis (inflammation) was found in the gastric body and gastric antrum.  Multiple biopsies were performed.   The stomach otherwise appeared normal. ESOPHAGUS: The mucosa of the esophagus appeared normal. DUODENUM: The duodenal mucosa showed no abnormalities in the bulb and second portion of the duodenum.  Retroflexed views revealed no abnormalities.     The scope was then withdrawn from the patient and the procedure completed.  COMPLICATIONS: There were no complications.  ENDOSCOPIC IMPRESSION: 1.   Gastritis (inflammation) in the gastric body and gastric antrum; multiple biopsies  RECOMMENDATIONS: 1.  await pathology results 2.  anti-reflux regimen 3.  continue PPI: Dexilant 60 mg po qam, refills for one year  [Recall Date & Procedure]  eSigned:  Meryl Dare, MD, [facility] 05/09/2012 2:27 PM

## 2012-05-09 NOTE — Patient Instructions (Addendum)
YOU HAD AN ENDOSCOPIC PROCEDURE TODAY AT THE [facility]: Refer to the procedure report that was given to you for any specific questions about what was found during the examination.  If the procedure report does not answer your questions, please call your gastroenterologist to clarify.  If you requested that your care partner not be given the details of your procedure findings, then the procedure report has been included in a sealed envelope for you to review at your convenience later.  YOU SHOULD EXPECT: Some feelings of bloating in the abdomen. Passage of more gas than usual.  Walking can help get rid of the air that was put into your GI tract during the procedure and reduce the bloating. If you had a lower endoscopy (such as a colonoscopy or flexible sigmoidoscopy) you may notice spotting of blood in your stool or on the toilet paper. If you underwent a bowel prep for your procedure, then you may not have a normal bowel movement for a few days.  DIET: Your first meal following the procedure should be a light meal and then it is ok to progress to your normal diet.  A half-sandwich or bowl of soup is an example of a good first meal.  Heavy or fried foods are harder to digest and may make you feel nauseous or bloated.  Likewise meals heavy in dairy and vegetables can cause extra gas to form and this can also increase the bloating.  Drink plenty of fluids but you should avoid alcoholic beverages for 24 hours.  ACTIVITY: Your care partner should take you home directly after the procedure.  You should plan to take it easy, moving slowly for the rest of the day.  You can resume normal activity the day after the procedure however you should NOT DRIVE or use heavy machinery for 24 hours (because of the sedation medicines used during the test).    SYMPTOMS TO REPORT IMMEDIATELY: A gastroenterologist can be reached at any hour.  During normal business hours, 8:30 AM to 5:00 PM Monday through Friday,  call [phone].  After hours and on weekends, please call the GI answering service at [phone] who will take a message and have the physician on call contact you.   Following lower endoscopy (colonoscopy or flexible sigmoidoscopy):  Excessive amounts of blood in the stool  Significant tenderness or worsening of abdominal pains  Swelling of the abdomen that is new, acute  Fever of 100F or higher  Following upper endoscopy (EGD)  Vomiting of blood or coffee ground material  New chest pain or pain under the shoulder blades  Painful or persistently difficult swallowing  New shortness of breath  Fever of 100F or higher  Black, tarry-looking stools  FOLLOW UP: If any biopsies were taken you will be contacted by phone or by letter within the next 1-3 weeks.  Call your gastroenterologist if you have not heard about the biopsies in 3 weeks.  Our staff will call the home number listed on your records the next business day following your procedure to check on you and address any questions or concerns that you may have at that time regarding the information given to you following your procedure. This is a courtesy call and so if there is no answer at the home number and we have not heard from you through the emergency physician on call, we will assume that you have returned to your regular daily activities without incident.  SIGNATURES/CONFIDENTIALITY: You and/or your care   partner have signed paperwork which will be entered into your electronic medical record.  These signatures attest to the fact that that the information above on your After Visit Summary has been reviewed and is understood.  Full responsibility of the confidentiality of this discharge information lies with you and/or your care-partner.  

## 2012-05-10 ENCOUNTER — Telehealth: Payer: Self-pay | Admitting: *Deleted

## 2012-05-10 NOTE — Telephone Encounter (Signed)
  Follow up Call-  Call back number 05/09/2012 12/20/2011  Post procedure Call Back phone  # [phone]  Permission to leave phone message Yes Yes     Patient questions:  Do you have a fever, pain , or abdominal swelling? no Pain Score  0 *  Have you tolerated food without any problems? yes  Have you been able to return to your normal activities? yes  Do you have any questions about your discharge instructions: Diet   no Medications  no Follow up visit  no  Do you have questions or concerns about your Care? no  Actions: * If pain score is 4 or above: No action needed, pain <4.

## 2012-05-14 ENCOUNTER — Telehealth: Payer: Self-pay | Admitting: Gastroenterology

## 2012-05-14 MED ORDER — OMEPRAZOLE-SODIUM BICARBONATE 40-1100 MG PO CAPS: 1.0000 | ORAL_CAPSULE | Freq: Every day | ORAL | Status: DC

## 2012-05-14 MED ORDER — ESOMEPRAZOLE MAGNESIUM 40 MG PO CPDR: 40.0000 mg | DELAYED_RELEASE_CAPSULE | Freq: Every day | ORAL | Status: DC

## 2012-05-14 NOTE — Telephone Encounter (Signed)
Patient reports that since starting on dexilant last week she has post prandial diarrhea and reflux.  She notes that she had reflux prior to dexilant , but the diarrhea is new.  She will come pick up samples of Nexium and zegerid and try each for 10 days.  She will call back and let us know which one works better for a rx.

## 2012-05-14 NOTE — Telephone Encounter (Signed)
Agree with trial of Nexium and Zegerid

## 2012-05-15 ENCOUNTER — Encounter: Payer: Self-pay | Admitting: Gastroenterology

## 2012-06-06 ENCOUNTER — Telehealth: Payer: Self-pay | Admitting: Gastroenterology

## 2012-06-06 MED ORDER — ESOMEPRAZOLE MAGNESIUM 40 MG PO CPDR: 40.0000 mg | DELAYED_RELEASE_CAPSULE | Freq: Every day | ORAL | Status: DC

## 2012-06-06 NOTE — Telephone Encounter (Signed)
Prescription of Nexium sent to patient's pharmacy.

## 2012-06-08 ENCOUNTER — Other Ambulatory Visit: Payer: Self-pay | Admitting: Gastroenterology

## 2012-06-08 MED ORDER — ESOMEPRAZOLE MAGNESIUM 40 MG PO CPDR: 40.0000 mg | DELAYED_RELEASE_CAPSULE | Freq: Every day | ORAL | Status: DC

## 2012-06-08 NOTE — Telephone Encounter (Signed)
Prescription resent to [facility]. 

## 2012-06-18 ENCOUNTER — Other Ambulatory Visit: Payer: Medicare Other

## 2012-06-29 ENCOUNTER — Other Ambulatory Visit (INDEPENDENT_AMBULATORY_CARE_PROVIDER_SITE_OTHER): Payer: Medicare Other

## 2012-06-29 DIAGNOSIS — E785 Hyperlipidemia, unspecified: Secondary | ICD-10-CM

## 2012-06-29 LAB — AST: AST: 26 U/L (ref 0–37)

## 2012-06-29 LAB — ALT: ALT: 17 U/L (ref 0–35)

## 2012-06-29 LAB — LIPID PANEL
LDL Cholesterol: 84 mg/dL (ref 0–99)
Total CHOL/HDL Ratio: 2
VLDL: 13.8 mg/dL (ref 0.0–40.0)

## 2012-07-02 ENCOUNTER — Encounter: Payer: Self-pay | Admitting: *Deleted

## 2012-08-23 ENCOUNTER — Other Ambulatory Visit: Payer: Self-pay | Admitting: Family Medicine

## 2012-09-01 ENCOUNTER — Other Ambulatory Visit: Payer: Self-pay

## 2012-09-18 ENCOUNTER — Other Ambulatory Visit: Payer: Self-pay | Admitting: Family Medicine

## 2012-09-19 NOTE — Telephone Encounter (Signed)
Verified that pt is taking the Nexium and has stop taking the Dexilant, med removed off med list and Nexium refilled

## 2012-09-19 NOTE — Telephone Encounter (Signed)
To PCP

## 2012-09-19 NOTE — Telephone Encounter (Signed)
I looked at her GI notes- and sounds like she choose this over other meds- please verify this and if so refil for the year and remove dexilant from med list thanks

## 2012-10-24 ENCOUNTER — Other Ambulatory Visit: Payer: Self-pay | Admitting: *Deleted

## 2012-10-24 MED ORDER — PRAVASTATIN SODIUM 20 MG PO TABS: 20.0000 mg | ORAL_TABLET | Freq: Every day | ORAL | Status: DC

## 2012-11-07 ENCOUNTER — Telehealth: Payer: Self-pay | Admitting: Family Medicine

## 2012-11-07 DIAGNOSIS — M899 Disorder of bone, unspecified: Secondary | ICD-10-CM

## 2012-11-07 DIAGNOSIS — E559 Vitamin D deficiency, unspecified: Secondary | ICD-10-CM

## 2012-11-07 DIAGNOSIS — K219 Gastro-esophageal reflux disease without esophagitis: Secondary | ICD-10-CM

## 2012-11-07 DIAGNOSIS — E785 Hyperlipidemia, unspecified: Secondary | ICD-10-CM

## 2012-11-07 DIAGNOSIS — Z0000 Encounter for general adult medical examination without abnormal findings: Secondary | ICD-10-CM | POA: Insufficient documentation

## 2012-11-07 NOTE — Telephone Encounter (Signed)
Message copied by Judy Pimple on Wed Nov 07, 2012 10:10 PM ------      Message from: Alvina Chou      Created: Tue Oct 30, 2012 12:21 PM      Regarding: lab orders for Thursday, 4.24.14       Patient is scheduled for CPX labs, please order future labs, Thanks , Terri       ------

## 2012-11-08 ENCOUNTER — Other Ambulatory Visit (INDEPENDENT_AMBULATORY_CARE_PROVIDER_SITE_OTHER): Payer: Medicare Other

## 2012-11-08 DIAGNOSIS — Z0000 Encounter for general adult medical examination without abnormal findings: Secondary | ICD-10-CM

## 2012-11-08 DIAGNOSIS — K219 Gastro-esophageal reflux disease without esophagitis: Secondary | ICD-10-CM

## 2012-11-08 DIAGNOSIS — E785 Hyperlipidemia, unspecified: Secondary | ICD-10-CM

## 2012-11-08 DIAGNOSIS — E559 Vitamin D deficiency, unspecified: Secondary | ICD-10-CM

## 2012-11-08 DIAGNOSIS — M899 Disorder of bone, unspecified: Secondary | ICD-10-CM

## 2012-11-08 LAB — COMPREHENSIVE METABOLIC PANEL
ALT: 17 U/L (ref 0–35)
AST: 23 U/L (ref 0–37)
Alkaline Phosphatase: 96 U/L (ref 39–117)
Calcium: 9.3 mg/dL (ref 8.4–10.5)
Chloride: 105 mEq/L (ref 96–112)
Creatinine, Ser: 0.9 mg/dL (ref 0.4–1.2)
Potassium: 3.9 mEq/L (ref 3.5–5.1)

## 2012-11-08 LAB — CBC WITH DIFFERENTIAL/PLATELET
Basophils Absolute: 0 10*3/uL (ref 0.0–0.1)
Basophils Relative: 0.8 % (ref 0.0–3.0)
Eosinophils Absolute: 0.2 10*3/uL (ref 0.0–0.7)
HCT: 39 % (ref 36.0–46.0)
Hemoglobin: 13.1 g/dL (ref 12.0–15.0)
Lymphs Abs: 2.3 10*3/uL (ref 0.7–4.0)
MCHC: 33.7 g/dL (ref 30.0–36.0)
MCV: 92.7 fl (ref 78.0–100.0)
Neutro Abs: 2.2 10*3/uL (ref 1.4–7.7)
RDW: 12.7 % (ref 11.5–14.6)

## 2012-11-08 LAB — LIPID PANEL
Total CHOL/HDL Ratio: 2
VLDL: 13 mg/dL (ref 0.0–40.0)

## 2012-11-08 LAB — TSH: TSH: 0.84 u[IU]/mL (ref 0.35–5.50)

## 2012-11-09 LAB — VITAMIN D 25 HYDROXY (VIT D DEFICIENCY, FRACTURES): Vit D, 25-Hydroxy: 57 ng/mL (ref 30–89)

## 2012-11-12 ENCOUNTER — Other Ambulatory Visit: Payer: Self-pay | Admitting: Family Medicine

## 2012-11-14 ENCOUNTER — Ambulatory Visit (INDEPENDENT_AMBULATORY_CARE_PROVIDER_SITE_OTHER): Payer: Medicare Other | Admitting: Family Medicine

## 2012-11-14 ENCOUNTER — Encounter: Payer: Self-pay | Admitting: Family Medicine

## 2012-11-14 VITALS — BP 98/62 | HR 78 | Temp 98.3°F | Ht 62.25 in | Wt 142.8 lb

## 2012-11-14 DIAGNOSIS — H6120 Impacted cerumen, unspecified ear: Secondary | ICD-10-CM

## 2012-11-14 DIAGNOSIS — E559 Vitamin D deficiency, unspecified: Secondary | ICD-10-CM

## 2012-11-14 DIAGNOSIS — H6122 Impacted cerumen, left ear: Secondary | ICD-10-CM | POA: Insufficient documentation

## 2012-11-14 DIAGNOSIS — M949 Disorder of cartilage, unspecified: Secondary | ICD-10-CM

## 2012-11-14 DIAGNOSIS — Z0000 Encounter for general adult medical examination without abnormal findings: Secondary | ICD-10-CM

## 2012-11-14 DIAGNOSIS — M899 Disorder of bone, unspecified: Secondary | ICD-10-CM

## 2012-11-14 DIAGNOSIS — E785 Hyperlipidemia, unspecified: Secondary | ICD-10-CM

## 2012-11-14 MED ORDER — ESOMEPRAZOLE MAGNESIUM 40 MG PO CPDR: 40.0000 mg | DELAYED_RELEASE_CAPSULE | Freq: Every day | ORAL | Status: DC

## 2012-11-14 MED ORDER — PRAVASTATIN SODIUM 20 MG PO TABS: 20.0000 mg | ORAL_TABLET | Freq: Every day | ORAL | Status: DC

## 2012-11-14 NOTE — Progress Notes (Signed)
Subjective:    Patient ID: Diane Delacruz, female    DOB: [birth year], 67 y.o.   MRN: [id number]  HPI I have personally reviewed the Medicare Annual Wellness questionnaire and have noted 1. The patient's medical and social history 2. Their use of alcohol, tobacco or illicit drugs 3. Their current medications and supplements 4. The patient's functional ability including ADL's, fall risks, home safety risks and hearing or visual             impairment. 5. Diet and physical activities 6. Evidence for depression or mood disorders  The patients weight, height, BMI have been recorded in the chart and visual acuity is per eye clinic.  I have made referrals, counseling and provided education to the patient based review of the above and I have provided the pt with a written personalized care plan for preventive services.  She is doing well  Nothing new going on  Wt is down 3 lb -- is eating somewhat healthy Some exercise - walks on the treadmill and also outdoor work -more active   See scanned forms.  Routine anticipatory guidance given to patient.  See health maintenance. Flu- did not get one this fall/ forgot to get it  Shingles status - has not had the vaccine - ? If insurance will pay , had mild case in the past  PNA 4/13  Tetanus 4/04 is due  Colonoscopy 6/13- with a 5 year recall, had a polyp  Breast cancer screening mammo 5/13 , [facility]- will make her own appt  Self exam-nl / no lumps or changes  Pap 4/13-normal , no hx of abn paps , no gyn symptoms  Advance directive- does not have a living will or POA  Cognitive function addressed- see scanned forms- and if abnormal then additional documentation follows.  occ forgets small things but overall not concerned  Falls none at all / balance is good  Mood - is good / much better than in the past / cymbata really helps / is not depressed or unmotivated   PMH and SH reviewed  Meds, vitals, and allergies reviewed.   ROS:  See HPI.  Otherwise negative.    Hyperlipidemia Lab Results  Component Value Date   CHOL 182 11/08/2012   CHOL 170 06/29/2012   CHOL 247* 04/30/2012   Lab Results  Component Value Date   HDL 72.90 11/08/2012   HDL 40.98 06/29/2012   HDL 73.60 04/30/2012   Lab Results  Component Value Date   LDLCALC 96 11/08/2012   LDLCALC 84 06/29/2012   Lab Results  Component Value Date   TRIG 65.0 11/08/2012   TRIG 69.0 06/29/2012   TRIG 74.0 04/30/2012   Lab Results  Component Value Date   CHOLHDL 2 11/08/2012   CHOLHDL 2 06/29/2012   CHOLHDL 3 04/30/2012   Lab Results  Component Value Date   LDLDIRECT 170.5 04/30/2012   LDLDIRECT 144.6 10/31/2011   LDLDIRECT 137.0 12/01/2010   On pravastatin- has made a huge difference - and no side eff  Diet - she avoids red meat /fried foods and sat fats   Osteopenia dexa 5/13 with T of -2.0 lowest Disc evista - she does not want to take anything right now  Has gerd D level good at 57 (low in the past)- takes her ca and D   GERD- symptoms still come and go Had EGD-nl bx  Takes nexium - and really depends on what she eats - tries to keep  track   Patient Active Problem List   Diagnosis Date Noted  . Impacted cerumen of left ear 11/14/2012  . Encounter for Medicare annual wellness exam 11/07/2012  . TMJ (temporomandibular joint syndrome) 02/24/2012  . Insomnia 02/21/2011  . Family history of colon cancer 11/01/2010  . Hyperlipidemia 11/01/2010  . VITAMIN D DEFICIENCY 10/10/2008  . IRRITABLE BOWEL SYNDROME 10/15/2007  . VARICOSE VEINS, LOWER EXTREMITIES 08/15/2007  . OSTEOPENIA 05/14/2007  . DEPRESSION 05/11/2007  . ALLERGIC RHINITIS, SEASONAL 05/11/2007  . GERD 05/11/2007  . FIBROCYSTIC BREAST DISEASE 05/11/2007  . POSTMENOPAUSAL STATUS 05/11/2007   Past Medical History  Diagnosis Date  . Depression   . CAD (coronary artery disease)   . Palpitations 03/06/09    event monitor  . Unspecified vitamin D deficiency   . Hypoglycemia,  unspecified   . Lumbago   . Irritable bowel syndrome   . Unspecified adverse effect of other drug, medicinal and biological substance(995.29)   . Asymptomatic varicose veins   . Pure hypercholesterolemia   . Diffuse cystic mastopathy   . Allergic rhinitis due to pollen   . Symptomatic menopausal or female climacteric states   . Esophageal reflux   . Dyspnea 2008    negative echo and MV scan  . DDD (degenerative disc disease)     in neck, neurosurgeon Dr. Dutch Quint  . Stress fracture of foot 03/28/99    left  . Bladder tumor 11/05  . Diverticulosis   . History of gallstones    Past Surgical History  Procedure Laterality Date  . Cholecystectomy      gallstones- ultrasound 10/03  . Bladder surgery      tumor removed   History  Substance Use Topics  . Smoking status: Never Smoker   . Smokeless tobacco: Never Used  . Alcohol Use: Yes     Comment: Rare-wine   Family History  Problem Relation Age of Onset  . Hyperlipidemia Mother   . Stroke Mother   . Arthritis Mother   . Macular degeneration Mother   . Irritable bowel syndrome Mother   . Colon cancer Father   . Heart disease Father     MI's  . COPD Sister     heavy smoker  . Colon polyps Maternal Aunt   . Breast cancer Paternal Aunt   . Osteopenia Sister   . Other Daughter     celiac spruce  . Diabetes Brother   . Diabetes Paternal Uncle   . Irritable bowel syndrome Sister   . Kidney disease Cousin   . Rectal cancer Neg Hx   . Stomach cancer Neg Hx    Allergies  Allergen Reactions  . Codeine     REACTION: unknown reaction  . Klonopin (Clonazepam)     Dizziness , fatigue  . Nsaids     REACTION: diarrhea  . Omeprazole     REACTION: did not work for her  . Pseudoephedrine     REACTION: jittery   Current Outpatient Prescriptions on File Prior to Visit  Medication Sig Dispense Refill  . acetaminophen (TYLENOL) 500 MG tablet Take 500 mg by mouth as needed.      . Calcium Carbonate-Vitamin D (CALCIUM 600-D)  600-400 MG-UNIT per tablet Take 1 tablet by mouth daily.        . Cholecalciferol (VITAMIN D) 2000 UNITS CAPS 1,000 Units daily. 1 capsule by mouth daily      . DULoxetine (CYMBALTA) 30 MG capsule Take 1 capsule (30 mg total) by mouth daily.  30 capsule  11  . fexofenadine (ALLEGRA) 60 MG tablet Take 180 mg by mouth daily as needed.       . zolpidem (AMBIEN) 5 MG tablet Take 1 tablet (5 mg total) by mouth at bedtime as needed.  30 tablet  0   No current facility-administered medications on file prior to visit.    Review of Systems Review of Systems  Constitutional: Negative for fever, appetite change, fatigue and unexpected weight change.  Eyes: Negative for pain and visual disturbance.  Respiratory: Negative for cough and shortness of breath.   Cardiovascular: Negative for cp or palpitations    Gastrointestinal: Negative for nausea, diarrhea and constipation.  Genitourinary: Negative for urgency and frequency.  Skin: Negative for pallor or rash   Neurological: Negative for weakness, light-headedness, numbness and headaches.  Hematological: Negative for adenopathy. Does not bruise/bleed easily.  Psychiatric/Behavioral: Negative for dysphoric mood. The patient is not nervous/anxious.         Objective:   Physical Exam  Constitutional: She appears well-developed and well-nourished. No distress.  HENT:  Head: Normocephalic and atraumatic.  Right Ear: External ear normal.  Left Ear: External ear normal.  Nose: Nose normal.  Mouth/Throat: Oropharynx is clear and moist.  Scant cerumen in L ear canal  Eyes: Conjunctivae and EOM are normal. Pupils are equal, round, and reactive to light. Right eye exhibits no discharge. Left eye exhibits no discharge. No scleral icterus.  Neck: Normal range of motion. Neck supple. No JVD present. Carotid bruit is not present. No thyromegaly present.  Cardiovascular: Normal rate, regular rhythm, normal heart sounds and intact distal pulses.  Exam reveals no  gallop.   Pulmonary/Chest: Effort normal and breath sounds normal. No respiratory distress. She has no wheezes. She exhibits no tenderness.  Abdominal: Soft. Bowel sounds are normal. She exhibits no distension, no abdominal bruit and no mass. There is no tenderness.  Genitourinary: No breast swelling, tenderness, discharge or bleeding.  Breast exam: No mass, nodules, thickening, tenderness, bulging, retraction, inflamation, nipple discharge or skin changes noted.  No axillary or clavicular LA.  Chaperoned exam.    Musculoskeletal: She exhibits no edema and no tenderness.  Lymphadenopathy:    She has no cervical adenopathy.  Neurological: She is alert. She has normal reflexes. No cranial nerve deficit. She exhibits normal muscle tone. Coordination normal.  Skin: Skin is warm and dry. No rash noted. No erythema. No pallor.  Psychiatric: She has a normal mood and affect.          Assessment & Plan:

## 2012-11-14 NOTE — Patient Instructions (Addendum)
If you are interested in a shingles/zoster vaccine - call your insurance to check on coverage,( you should not get it within 1 month of other vaccines) , then call us for a prescription  for it to take to a pharmacy that gives the shot , or make a nurse visit to get it here depending on your coverage You are due for a 10 year tetanus booster - medicare does not cover this -please call your insurance to see if Td or Tdap vaccines are covered and make a nurse visit appt if it is (or can go to the health dept for a cheaper vaccine if it is not) Don't forget to call and make your annual mammogram appt  Don't forget to work on establishing a living will/ power of attorney For small amount of ear wax on left- get debrox solution over the counter and use it as directed  Labs are ok  Eat a healthy diet and get exercise

## 2012-11-15 NOTE — Assessment & Plan Note (Signed)
D level is tx with current intake Enc to continue  Disc imp to bone and overall health

## 2012-11-15 NOTE — Assessment & Plan Note (Signed)
Small amt of cerumen -not enough to irrigate- recommend using debrox otc and f/u prn

## 2012-11-15 NOTE — Assessment & Plan Note (Signed)
Reviewed health habits including diet and exercise and skin cancer prevention Also reviewed health mt list, fam hx and immunizations  See HPI Rev wellness lab in detail

## 2012-11-15 NOTE — Assessment & Plan Note (Signed)
Pt declined med at this time She has had no falls or fx  Disc ca and D Re check dexa at 2 y

## 2012-11-15 NOTE — Assessment & Plan Note (Signed)
Disc goals for lipids and reasons to control them Rev labs with pt Rev low sat fat diet in detail On pravachol and diet

## 2013-02-04 ENCOUNTER — Other Ambulatory Visit: Payer: Self-pay | Admitting: Family Medicine

## 2013-02-04 NOTE — Telephone Encounter (Signed)
Please refill for a year thanks 

## 2013-02-04 NOTE — Telephone Encounter (Signed)
done

## 2013-02-04 NOTE — Telephone Encounter (Signed)
Electronic refill request, please advise  

## 2013-04-05 ENCOUNTER — Ambulatory Visit (INDEPENDENT_AMBULATORY_CARE_PROVIDER_SITE_OTHER): Payer: Medicare Other | Admitting: Family Medicine

## 2013-04-05 ENCOUNTER — Encounter: Payer: Self-pay | Admitting: Family Medicine

## 2013-04-05 VITALS — BP 100/60 | HR 81 | Temp 98.1°F | Ht 62.5 in | Wt 147.0 lb

## 2013-04-05 DIAGNOSIS — R1013 Epigastric pain: Secondary | ICD-10-CM

## 2013-04-05 DIAGNOSIS — R109 Unspecified abdominal pain: Secondary | ICD-10-CM

## 2013-04-05 LAB — CBC WITH DIFFERENTIAL/PLATELET
Eosinophils Relative: 4.3 % (ref 0.0–5.0)
HCT: 39.3 % (ref 36.0–46.0)
Hemoglobin: 13.2 g/dL (ref 12.0–15.0)
Lymphs Abs: 1.9 10*3/uL (ref 0.7–4.0)
Monocytes Relative: 7.4 % (ref 3.0–12.0)
Neutro Abs: 2.8 10*3/uL (ref 1.4–7.7)
WBC: 5.3 10*3/uL (ref 4.5–10.5)

## 2013-04-05 LAB — POCT URINALYSIS DIPSTICK
Bilirubin, UA: NEGATIVE
Ketones, UA: NEGATIVE
Leukocytes, UA: NEGATIVE

## 2013-04-05 LAB — BASIC METABOLIC PANEL
Calcium: 9.4 mg/dL (ref 8.4–10.5)
GFR: 63.17 mL/min (ref >60.00)
Glucose, Bld: 94 mg/dL (ref 70–99)
Sodium: 137 mEq/L (ref 135–145)

## 2013-04-05 LAB — HEPATIC FUNCTION PANEL
ALT: 17 U/L (ref 0–35)
AST: 23 U/L (ref 0–37)
Albumin: 3.9 g/dL (ref 3.5–5.2)

## 2013-04-05 NOTE — Progress Notes (Signed)
Subjective:    Patient ID: Diane Delacruz, female    DOB: [birth year], 67 y.o.   MRN: [id number]  HPI Here with malaise Tired  Some pain in upper abd - on and off  Dull pain  No change with eating  No appetite At one time burning in the chest  With nausea but no vomiting No diarhrrea or constipation   Takes nexium 40 mg every day  Has not missed any doses    Had her gallbladder removed  No new medicines   abd sx past: ccy, bladder tumor removal  No sick contacts or new stress  No fever - but has felt hot and cold  6/13 colonosocpy  Wt is up 5 lb   She does have a hx of utis without standard symptoms    No sinus or allergy symptoms   Patient Active Problem List   Diagnosis Date Noted  . Impacted cerumen of left ear 11/14/2012  . Encounter for Medicare annual wellness exam 11/07/2012  . TMJ (temporomandibular joint syndrome) 02/24/2012  . Insomnia 02/21/2011  . Family history of colon cancer 11/01/2010  . Hyperlipidemia 11/01/2010  . VITAMIN D DEFICIENCY 10/10/2008  . IRRITABLE BOWEL SYNDROME 10/15/2007  . VARICOSE VEINS, LOWER EXTREMITIES 08/15/2007  . OSTEOPENIA 05/14/2007  . DEPRESSION 05/11/2007  . ALLERGIC RHINITIS, SEASONAL 05/11/2007  . GERD 05/11/2007  . FIBROCYSTIC BREAST DISEASE 05/11/2007  . POSTMENOPAUSAL STATUS 05/11/2007   Past Medical History  Diagnosis Date  . Depression   . CAD (coronary artery disease)   . Palpitations 03/06/09    event monitor  . Unspecified vitamin D deficiency   . Hypoglycemia, unspecified   . Lumbago   . Irritable bowel syndrome   . Unspecified adverse effect of other drug, medicinal and biological substance(995.29)   . Asymptomatic varicose veins   . Pure hypercholesterolemia   . Diffuse cystic mastopathy   . Allergic rhinitis due to pollen   . Symptomatic menopausal or female climacteric states   . Esophageal reflux   . Dyspnea 2008    negative echo and MV scan  . DDD (degenerative disc disease)     in  neck, neurosurgeon Dr. Dutch Quint  . Stress fracture of foot 03/28/99    left  . Bladder tumor 11/05  . Diverticulosis   . History of gallstones    Past Surgical History  Procedure Laterality Date  . Cholecystectomy      gallstones- ultrasound 10/03  . Bladder surgery      tumor removed   History  Substance Use Topics  . Smoking status: Never Smoker   . Smokeless tobacco: Never Used  . Alcohol Use: Yes     Comment: Rare-wine   Family History  Problem Relation Age of Onset  . Hyperlipidemia Mother   . Stroke Mother   . Arthritis Mother   . Macular degeneration Mother   . Irritable bowel syndrome Mother   . Colon cancer Father   . Heart disease Father     MI's  . COPD Sister     heavy smoker  . Colon polyps Maternal Aunt   . Breast cancer Paternal Aunt   . Osteopenia Sister   . Other Daughter     celiac spruce  . Diabetes Brother   . Diabetes Paternal Uncle   . Irritable bowel syndrome Sister   . Kidney disease Cousin   . Rectal cancer Neg Hx   . Stomach cancer Neg Hx    Allergies  Allergen Reactions  .  Codeine     REACTION: unknown reaction  . Klonopin [Clonazepam]     Dizziness , fatigue  . Nsaids     REACTION: diarrhea  . Omeprazole     REACTION: did not work for her  . Pseudoephedrine     REACTION: jittery   Current Outpatient Prescriptions on File Prior to Visit  Medication Sig Dispense Refill  . acetaminophen (TYLENOL) 500 MG tablet Take 500 mg by mouth as needed.      . Calcium Carbonate-Vitamin D (CALCIUM 600-D) 600-400 MG-UNIT per tablet Take 1 tablet by mouth daily.        . Cholecalciferol (VITAMIN D) 2000 UNITS CAPS 1,000 Units daily. 1 capsule by mouth daily      . DULoxetine (CYMBALTA) 30 MG capsule TAKE 1 CAPSULE (30 MG TOTAL) BY MOUTH DAILY.  30 capsule  11  . esomeprazole (NEXIUM) 40 MG capsule Take 1 capsule (40 mg total) by mouth daily before breakfast.  30 capsule  11  . fexofenadine (ALLEGRA) 60 MG tablet Take 180 mg by mouth daily as  needed.       . mometasone (NASONEX) 50 MCG/ACT nasal spray Place 2 sprays into the nose as needed.      . pravastatin (PRAVACHOL) 20 MG tablet Take 1 tablet (20 mg total) by mouth daily.  30 tablet  11  . zolpidem (AMBIEN) 5 MG tablet Take 1 tablet (5 mg total) by mouth at bedtime as needed.  30 tablet  0   No current facility-administered medications on file prior to visit.    Review of Systems Review of Systems  Constitutional: Negative for fever, appetite change,  and unexpected weight change. pos for fatigue Eyes: Negative for pain and visual disturbance.  Respiratory: Negative for cough and shortness of breath.   Cardiovascular: Negative for cp or palpitations    Gastrointestinal: Negative for nausea, diarrhea and constipation. pos for abd pain in upper abd and also in L flank area  Genitourinary: Negative for urgency and frequency. neg for dysuria or blood in urine  Skin: Negative for pallor or rash   Neurological: Negative for weakness, light-headedness, numbness and headaches.  Hematological: Negative for adenopathy. Does not bruise/bleed easily.  Psychiatric/Behavioral: Negative for dysphoric mood. The patient is not nervous/anxious.         Objective:   Physical Exam  Constitutional: She appears well-developed and well-nourished. No distress.  HENT:  Head: Normocephalic and atraumatic.  Right Ear: External ear normal.  Left Ear: External ear normal.  Nose: Nose normal.  Mouth/Throat: Oropharynx is clear and moist. No oropharyngeal exudate.  Eyes: Conjunctivae are normal. Pupils are equal, round, and reactive to light. Right eye exhibits no discharge. Left eye exhibits no discharge. No scleral icterus.  Neck: Normal range of motion. Neck supple. No JVD present. Carotid bruit is not present. No thyromegaly present.  Cardiovascular: Normal rate, normal heart sounds and intact distal pulses.  Exam reveals no gallop.   Pulmonary/Chest: Effort normal and breath sounds normal.  No respiratory distress. She has no wheezes. She has no rales. She exhibits no tenderness.  No crackles   Abdominal: Soft. Bowel sounds are normal. She exhibits no distension, no abdominal bruit and no mass. There is no hepatosplenomegaly. There is tenderness in the epigastric area and left upper quadrant. There is no rebound, no guarding and no CVA tenderness.  Musculoskeletal: She exhibits no edema and no tenderness.  Lymphadenopathy:    She has no cervical adenopathy.  Neurological: She is alert.  She has normal reflexes. No cranial nerve deficit. She exhibits normal muscle tone. Coordination normal.  Skin: Skin is warm and dry. No rash noted. No erythema. No pallor.  Psychiatric: She has a normal mood and affect.          Assessment & Plan:

## 2013-04-05 NOTE — Patient Instructions (Addendum)
Stick with a bland diet for now Increase your nexium to twice daily until we get labs back Labs today Will go from there based on results

## 2013-04-07 NOTE — Assessment & Plan Note (Signed)
With some fatigue and general malaise Rev old records Gastritis is poss Lab today  Will inc nexium to bid  Then make further plan - may need GI visit if not imp

## 2013-04-11 ENCOUNTER — Telehealth: Payer: Self-pay

## 2013-04-11 DIAGNOSIS — R110 Nausea: Secondary | ICD-10-CM | POA: Insufficient documentation

## 2013-04-11 DIAGNOSIS — R1013 Epigastric pain: Secondary | ICD-10-CM

## 2013-04-11 NOTE — Telephone Encounter (Signed)
When pt went on my chart did not see comments from lab results. Pt notified as instructed from lab results. Pt said since taking the Nexium twice a day does not see any improvement. Pt said still nauseated especially after eats anything and pt feels weak and tired.[facility].Please advise.

## 2013-04-11 NOTE — Telephone Encounter (Signed)
Pt notified GI referral done and Marion/Linda will call to schedule appt

## 2013-04-11 NOTE — Telephone Encounter (Signed)
Ok- I am going to refer her to GI- let her know we will be calling about that

## 2013-04-19 ENCOUNTER — Encounter: Payer: Self-pay | Admitting: Gastroenterology

## 2013-05-21 ENCOUNTER — Ambulatory Visit: Payer: Medicare Other | Admitting: Gastroenterology

## 2013-05-23 ENCOUNTER — Other Ambulatory Visit: Payer: Self-pay

## 2013-08-20 ENCOUNTER — Encounter: Payer: Self-pay | Admitting: Family Medicine

## 2013-08-20 ENCOUNTER — Ambulatory Visit (INDEPENDENT_AMBULATORY_CARE_PROVIDER_SITE_OTHER): Payer: Medicare Other | Admitting: Family Medicine

## 2013-08-20 VITALS — BP 106/72 | HR 76 | Temp 97.9°F | Ht 62.5 in | Wt 145.5 lb

## 2013-08-20 DIAGNOSIS — J019 Acute sinusitis, unspecified: Secondary | ICD-10-CM

## 2013-08-20 DIAGNOSIS — B9689 Other specified bacterial agents as the cause of diseases classified elsewhere: Secondary | ICD-10-CM | POA: Insufficient documentation

## 2013-08-20 MED ORDER — LEVOFLOXACIN 500 MG PO TABS: 500.0000 mg | ORAL_TABLET | Freq: Every day | ORAL | Status: DC

## 2013-08-20 NOTE — Progress Notes (Signed)
Subjective:    Patient ID: Diane Delacruz, female    DOB: March 10, 1946, 68 y.o.   MRN: [id number]  HPI Here for 3 weeks of cough It started as a cold Felt better and then "started back up " again  Usually worse in the afternoons, not too bad at night  Lots of post nasal drainage   Cough is slt prod / phlegm has color to it  No fever   No facial pain/ some pressure  occ little headache   Taking mucinex sinus   Patient Active Problem List   Diagnosis Date Noted  . Nausea alone 04/11/2013  . Abdominal pain, epigastric 04/05/2013  . Impacted cerumen of left ear 11/14/2012  . Encounter for Medicare annual wellness exam 11/07/2012  . TMJ (temporomandibular joint syndrome) 02/24/2012  . Insomnia 02/21/2011  . Family history of colon cancer 11/01/2010  . Hyperlipidemia 11/01/2010  . VITAMIN D DEFICIENCY 10/10/2008  . IRRITABLE BOWEL SYNDROME 10/15/2007  . VARICOSE VEINS, LOWER EXTREMITIES 08/15/2007  . OSTEOPENIA 05/14/2007  . DEPRESSION 05/11/2007  . ALLERGIC RHINITIS, SEASONAL 05/11/2007  . GERD 05/11/2007  . FIBROCYSTIC BREAST DISEASE 05/11/2007  . POSTMENOPAUSAL STATUS 05/11/2007   Past Medical History  Diagnosis Date  . Depression   . CAD (coronary artery disease)   . Palpitations 03/06/09    event monitor  . Unspecified vitamin D deficiency   . Hypoglycemia, unspecified   . Lumbago   . Irritable bowel syndrome   . Unspecified adverse effect of other drug, medicinal and biological substance(995.29)   . Asymptomatic varicose veins   . Pure hypercholesterolemia   . Diffuse cystic mastopathy   . Allergic rhinitis due to pollen   . Symptomatic menopausal or female climacteric states   . Esophageal reflux   . Dyspnea 2008    negative echo and MV scan  . DDD (degenerative disc disease)     in neck, neurosurgeon Dr. Trenton Gammon  . Stress fracture of foot 03/28/99    left  . Bladder tumor 11/05  . Diverticulosis   . History of gallstones    Past Surgical History    Procedure Laterality Date  . Cholecystectomy      gallstones- ultrasound 10/03  . Bladder surgery      tumor removed   History  Substance Use Topics  . Smoking status: Never Smoker   . Smokeless tobacco: Never Used  . Alcohol Use: Yes     Comment: Rare-wine   Family History  Problem Relation Age of Onset  . Hyperlipidemia Mother   . Stroke Mother   . Arthritis Mother   . Macular degeneration Mother   . Irritable bowel syndrome Mother   . Colon cancer Father   . Heart disease Father     MI's  . COPD Sister     heavy smoker  . Colon polyps Maternal Aunt   . Breast cancer Paternal Aunt   . Osteopenia Sister   . Other Daughter     celiac spruce  . Diabetes Brother   . Diabetes Paternal Uncle   . Irritable bowel syndrome Sister   . Kidney disease Cousin   . Rectal cancer Neg Hx   . Stomach cancer Neg Hx    Allergies  Allergen Reactions  . Codeine     REACTION: unknown reaction  . Klonopin [Clonazepam]     Dizziness , fatigue  . Nsaids     REACTION: diarrhea  . Omeprazole     REACTION: did not work for  her  . Pseudoephedrine     REACTION: jittery   Current Outpatient Prescriptions on File Prior to Visit  Medication Sig Dispense Refill  . acetaminophen (TYLENOL) 500 MG tablet Take 500 mg by mouth as needed.      . Calcium Carbonate-Vitamin D (CALCIUM 600-D) 600-400 MG-UNIT per tablet Take 1 tablet by mouth daily.        . Cholecalciferol (VITAMIN D) 2000 UNITS CAPS 1,000 Units daily. 1 capsule by mouth daily      . DULoxetine (CYMBALTA) 30 MG capsule TAKE 1 CAPSULE (30 MG TOTAL) BY MOUTH DAILY.  30 capsule  11  . esomeprazole (NEXIUM) 40 MG capsule Take 1 capsule (40 mg total) by mouth daily before breakfast.  30 capsule  11  . fexofenadine (ALLEGRA) 60 MG tablet Take 180 mg by mouth daily as needed.       . mometasone (NASONEX) 50 MCG/ACT nasal spray Place 2 sprays into the nose as needed.      . pravastatin (PRAVACHOL) 20 MG tablet Take 1 tablet (20 mg total)  by mouth daily.  30 tablet  11  . zolpidem (AMBIEN) 5 MG tablet Take 1 tablet (5 mg total) by mouth at bedtime as needed.  30 tablet  0   No current facility-administered medications on file prior to visit.    Review of Systems Review of Systems  Constitutional: Negative for fever, appetite change,  and unexpected weight change. ENt pos for congestion and sinus pressure and post nasal drip   Eyes: Negative for pain and visual disturbance.  Respiratory: Negative for wheeze  and shortness of breath.   Cardiovascular: Negative for cp or palpitations    Gastrointestinal: Negative for nausea, diarrhea and constipation.  Genitourinary: Negative for urgency and frequency.  Skin: Negative for pallor or rash   Neurological: Negative for weakness, light-headedness, numbness and headaches.  Hematological: Negative for adenopathy. Does not bruise/bleed easily.  Psychiatric/Behavioral: Negative for dysphoric mood. The patient is not nervous/anxious.         Objective:   Physical Exam  Constitutional: She appears well-developed and well-nourished. No distress.  HENT:  Head: Normocephalic and atraumatic.  Right Ear: External ear normal.  Left Ear: External ear normal.  Mouth/Throat: Oropharynx is clear and moist. No oropharyngeal exudate.  Nares are injected and congested  bilat maxillary sinus tenderness    Eyes: Conjunctivae and EOM are normal. Pupils are equal, round, and reactive to light. Right eye exhibits no discharge. Left eye exhibits no discharge. No scleral icterus.  Neck: Normal range of motion. Neck supple. No JVD present. No thyromegaly present.  Cardiovascular: Normal rate and regular rhythm.   Pulmonary/Chest: Effort normal and breath sounds normal. No respiratory distress. She has no wheezes. She has no rales.  Lymphadenopathy:    She has no cervical adenopathy.  Neurological: She is alert. No cranial nerve deficit.  Skin: Skin is dry. No rash noted.  Psychiatric: She has a  normal mood and affect.          Assessment & Plan:

## 2013-08-20 NOTE — Patient Instructions (Signed)
I think you have a sinus infection  Take the levaquin as directed  Drink fluids and get rest  Continue mucinex as needed Update if not starting to improve in a week or if worsening

## 2013-08-20 NOTE — Progress Notes (Signed)
Pre-visit discussion using our clinic review tool. No additional management support is needed unless otherwise documented below in the visit note.  

## 2013-08-21 NOTE — Assessment & Plan Note (Signed)
Cover with levaquin  Disc symptomatic care - see instructions on AVS  Update if not starting to improve in a week or if worsening   

## 2013-10-08 ENCOUNTER — Ambulatory Visit (INDEPENDENT_AMBULATORY_CARE_PROVIDER_SITE_OTHER): Payer: Medicare Other | Admitting: Family Medicine

## 2013-10-08 ENCOUNTER — Ambulatory Visit (INDEPENDENT_AMBULATORY_CARE_PROVIDER_SITE_OTHER)
Admission: RE | Admit: 2013-10-08 | Discharge: 2013-10-08 | Disposition: A | Discharge status: Home / Self Care | Payer: Medicare Other | Source: Ambulatory Visit | Attending: Family Medicine | Admitting: Family Medicine

## 2013-10-08 ENCOUNTER — Encounter: Payer: Self-pay | Admitting: Family Medicine

## 2013-10-08 VITALS — BP 114/60 | HR 73 | Temp 97.9°F | Wt 149.0 lb

## 2013-10-08 DIAGNOSIS — M542 Cervicalgia: Secondary | ICD-10-CM

## 2013-10-08 MED ORDER — METHOCARBAMOL 500 MG PO TABS: 500.0000 mg | ORAL_TABLET | Freq: Four times a day (QID) | ORAL | Status: DC

## 2013-10-08 NOTE — Patient Instructions (Signed)
Try stretching, heat, and the robaxin (muscle relaxer).  Go to the lab on the way out.  We'll contact you with your xray report. Take care.  Glad to see you.  Let us know if this doesn't help.

## 2013-10-08 NOTE — Progress Notes (Signed)
Pre visit review using our clinic review tool, if applicable. No additional management support is needed unless otherwise documented below in the visit note.  HA start in the back of the neck and come up the posterior head, over the R ear, R>L side.  Doing on for about 1 week.  This is a new issue for her.  H/o migraines in the distant past.  She prev could see a lightning bolt and then get nauseated, but this isn't recent.  Photophobia but not phonophobia noted recently.  No focal neuro changes.  She has been slightly lightheaded recently, with or with out the HA.  No recent neck trauma.  Hasn't seen Dr. Annette Stable recently.  No h/o neck surgery.  Taking tylenol helps a little.  "I have to get the pillow just right to lay down."  Pillow is in good shape.    Meds, vitals, and allergies reviewed.   ROS: See HPI.  Otherwise, noncontributory.  GEN: nad, alert and oriented HEENT: mucous membranes moist, TM wnl, nasal and OP exam wnl, PERRL, EOMI NECK: supple w/o LA and no midline pain but B paraspinal muscles ttp, R>L CV: rrr.  PULM: ctab, no inc wob ABD: soft, +bs EXT: no edema SKIN: no acute rash CN 2-12 wnl B, S/S/DTR wnl x4

## 2013-10-08 NOTE — Assessment & Plan Note (Signed)
With possible occipital nerve irritation.  Could be nerve compression leading to muscle spasms, or vice versa.  Either could trigger migraine sx. D/w pt. Heat, stretching, robaxin as she is intolerant of flexeril.  See notes on xray.  She agrees with plan.

## 2013-11-29 ENCOUNTER — Other Ambulatory Visit: Payer: Self-pay | Admitting: Family Medicine

## 2013-12-06 ENCOUNTER — Other Ambulatory Visit: Payer: Self-pay | Admitting: *Deleted

## 2013-12-06 MED ORDER — PRAVASTATIN SODIUM 20 MG PO TABS: 20.0000 mg | ORAL_TABLET | Freq: Every day | ORAL | Status: DC

## 2013-12-06 NOTE — Telephone Encounter (Signed)
I refilled pravastatin for #30 x 5, since pt has cpx with labs prior scheduled in 04/2014.

## 2014-02-09 ENCOUNTER — Emergency Department (HOSPITAL_COMMUNITY): Payer: Medicare Other

## 2014-02-09 ENCOUNTER — Encounter (HOSPITAL_COMMUNITY): Payer: Self-pay | Admitting: Emergency Medicine

## 2014-02-09 ENCOUNTER — Emergency Department (HOSPITAL_COMMUNITY)
Admission: EM | Admit: 2014-02-09 | Discharge: 2014-02-09 | Disposition: A | Discharge status: Home / Self Care | Payer: Medicare Other | Attending: Emergency Medicine | Admitting: Emergency Medicine

## 2014-02-09 DIAGNOSIS — M545 Low back pain, unspecified: Secondary | ICD-10-CM

## 2014-02-09 DIAGNOSIS — E7800 Pure hypercholesterolemia, unspecified: Secondary | ICD-10-CM | POA: Insufficient documentation

## 2014-02-09 DIAGNOSIS — Z79899 Other long term (current) drug therapy: Secondary | ICD-10-CM | POA: Diagnosis not present

## 2014-02-09 DIAGNOSIS — F329 Major depressive disorder, single episode, unspecified: Secondary | ICD-10-CM | POA: Diagnosis not present

## 2014-02-09 DIAGNOSIS — Z8742 Personal history of other diseases of the female genital tract: Secondary | ICD-10-CM | POA: Insufficient documentation

## 2014-02-09 DIAGNOSIS — I2510 Atherosclerotic heart disease of native coronary artery without angina pectoris: Secondary | ICD-10-CM | POA: Insufficient documentation

## 2014-02-09 DIAGNOSIS — E559 Vitamin D deficiency, unspecified: Secondary | ICD-10-CM | POA: Insufficient documentation

## 2014-02-09 DIAGNOSIS — Z880 Allergy status to penicillin: Secondary | ICD-10-CM | POA: Insufficient documentation

## 2014-02-09 DIAGNOSIS — G8929 Other chronic pain: Secondary | ICD-10-CM | POA: Insufficient documentation

## 2014-02-09 DIAGNOSIS — Z8551 Personal history of malignant neoplasm of bladder: Secondary | ICD-10-CM | POA: Insufficient documentation

## 2014-02-09 DIAGNOSIS — R109 Unspecified abdominal pain: Secondary | ICD-10-CM | POA: Insufficient documentation

## 2014-02-09 DIAGNOSIS — K219 Gastro-esophageal reflux disease without esophagitis: Secondary | ICD-10-CM | POA: Diagnosis not present

## 2014-02-09 DIAGNOSIS — Z87312 Personal history of (healed) stress fracture: Secondary | ICD-10-CM | POA: Insufficient documentation

## 2014-02-09 DIAGNOSIS — F3289 Other specified depressive episodes: Secondary | ICD-10-CM | POA: Diagnosis not present

## 2014-02-09 LAB — URINALYSIS, ROUTINE W REFLEX MICROSCOPIC
BILIRUBIN URINE: NEGATIVE
Glucose, UA: NEGATIVE mg/dL
Ketones, ur: NEGATIVE mg/dL
Leukocytes, UA: NEGATIVE
Nitrite: NEGATIVE
Protein, ur: NEGATIVE mg/dL
Specific Gravity, Urine: 1.009 (ref 1.005–1.030)
UROBILINOGEN UA: 0.2 mg/dL (ref 0.0–1.0)
pH: 8 (ref 5.0–8.0)

## 2014-02-09 LAB — URINE MICROSCOPIC-ADD ON

## 2014-02-09 MED ORDER — HYDROCODONE-ACETAMINOPHEN 5-325 MG PO TABS: 1.0000 | ORAL_TABLET | ORAL | Status: DC | PRN

## 2014-02-09 MED ORDER — HYDROCODONE-ACETAMINOPHEN 5-325 MG PO TABS
1.0000 | ORAL_TABLET | Freq: Once | ORAL | Status: AC
  Administered 2014-02-09: 1 via ORAL
  Filled 2014-02-09: qty 1

## 2014-02-09 NOTE — ED Provider Notes (Signed)
CSN: [id number]     Arrival date & time 02/09/14  0302 History   First MD Initiated Contact with Patient 02/09/14 [phone]     Chief Complaint  Patient presents with  . Flank Pain     (Consider location/radiation/quality/duration/timing/severity/associated sxs/prior Treatment) HPI Patient presents with 2 days of left sided low back pain. It is worse with movement and palpation. She's taken Tylenol and half of a muscle relaxant. Denies any weakness or numbness of extremities. Patient has a history of chronic low back pain but she states this feels different. She's had no trauma. She has urinary frequency but denies any hesitancy or incontinence. Denies any dysuria, nausea, vomiting, fever or chills. She's had no abdominal pain. Past Medical History  Diagnosis Date  . Depression   . CAD (coronary artery disease)   . Palpitations 03/06/09    event monitor  . Unspecified vitamin D deficiency   . Hypoglycemia, unspecified   . Lumbago   . Irritable bowel syndrome   . Unspecified adverse effect of other drug, medicinal and biological substance(995.29)   . Asymptomatic varicose veins   . Pure hypercholesterolemia   . Diffuse cystic mastopathy   . Allergic rhinitis due to pollen   . Symptomatic menopausal or female climacteric states   . Esophageal reflux   . Dyspnea 2008    negative echo and MV scan  . DDD (degenerative disc disease)     in neck, neurosurgeon Dr. Trenton Gammon  . Stress fracture of foot 03/28/99    left  . Bladder tumor 11/05  . Diverticulosis   . History of gallstones    Past Surgical History  Procedure Laterality Date  . Cholecystectomy      gallstones- ultrasound 10/03  . Bladder surgery      tumor removed   Family History  Problem Relation Age of Onset  . Hyperlipidemia Mother   . Stroke Mother   . Arthritis Mother   . Macular degeneration Mother   . Irritable bowel syndrome Mother   . Colon cancer Father   . Heart disease Father     MI's  . COPD Sister    heavy smoker  . Colon polyps Maternal Aunt   . Breast cancer Paternal Aunt   . Osteopenia Sister   . Other Daughter     celiac spruce  . Diabetes Brother   . Diabetes Paternal Uncle   . Irritable bowel syndrome Sister   . Kidney disease Cousin   . Rectal cancer Neg Hx   . Stomach cancer Neg Hx    History  Substance Use Topics  . Smoking status: Never Smoker   . Smokeless tobacco: Never Used  . Alcohol Use: Yes     Comment: Rare-wine   OB History   Grav Para Term Preterm Abortions TAB SAB Ect Mult Living                 Review of Systems  Constitutional: Negative for fever and chills.  Respiratory: Negative for shortness of breath.   Cardiovascular: Negative for chest pain.  Gastrointestinal: Negative for nausea, vomiting, abdominal pain and diarrhea.  Genitourinary: Positive for frequency. Negative for dysuria, flank pain and difficulty urinating.  Musculoskeletal: Positive for back pain. Negative for neck pain and neck stiffness.  Skin: Negative for rash and wound.  Neurological: Negative for dizziness, weakness, numbness and headaches.  All other systems reviewed and are negative.     Allergies  Amoxicillin; Codeine; Flexeril; Klonopin; Nsaids; Omeprazole; and Pseudoephedrine  Home  Medications   Prior to Admission medications   Medication Sig Start Date End Date Taking? Authorizing Provider  acetaminophen (TYLENOL) 500 MG tablet Take 1,000 mg by mouth every 6 (six) hours as needed for mild pain.    Yes Historical Provider, MD  Calcium Carbonate-Vitamin D (CALCIUM 600-D) 600-400 MG-UNIT per tablet Take 1 tablet by mouth daily.     Yes Historical Provider, MD  Cholecalciferol (VITAMIN D) 2000 UNITS CAPS 1,000 Units daily. 1 capsule by mouth daily   Yes Historical Provider, MD  DULoxetine (CYMBALTA) 30 MG capsule Take 30 mg by mouth daily.   Yes Historical Provider, MD  esomeprazole (NEXIUM) 40 MG capsule Take 40 mg by mouth daily at 12 noon.   Yes Historical  Provider, MD  fexofenadine (ALLEGRA) 60 MG tablet Take 180 mg by mouth daily as needed for allergies.    Yes Historical Provider, MD  mometasone (NASONEX) 50 MCG/ACT nasal spray Place 2 sprays into the nose daily as needed (allergies).  11/02/11  Yes Abner Greenspan, MD  pravastatin (PRAVACHOL) 20 MG tablet Take 1 tablet (20 mg total) by mouth daily. 12/06/13  Yes Abner Greenspan, MD  HYDROcodone-acetaminophen (NORCO) 5-325 MG per tablet Take 1 tablet by mouth every 4 (four) hours as needed. 02/09/14   Julianne Rice, MD   BP 126/56  Pulse 76  Temp(Src) 97.8 F (36.6 C) (Oral)  Resp 18  SpO2 98%  LMP 07/19/1995 Physical Exam  Nursing note and vitals reviewed. Constitutional: She is oriented to person, place, and time. She appears well-developed and well-nourished. No distress.  HENT:  Head: Normocephalic and atraumatic.  Mouth/Throat: Oropharynx is clear and moist.  Eyes: EOM are normal. Pupils are equal, round, and reactive to light.  Neck: Normal range of motion. Neck supple.  Cardiovascular: Normal rate and regular rhythm.   Pulmonary/Chest: Effort normal and breath sounds normal. No respiratory distress. She has no wheezes. She has no rales. She exhibits no tenderness.  Abdominal: Soft. Bowel sounds are normal. She exhibits no distension and no mass. There is no tenderness. There is no rebound and no guarding.  Musculoskeletal: Normal range of motion. She exhibits no edema and no tenderness.  No bilateral CVA tenderness. No midline thoracic or lumbar tenderness. Patient has mild left paraspinal lumbar region in the L4/L5/S1 region. Negative straight leg raise bilaterally. All pulses equally intact.  Neurological: She is alert and oriented to person, place, and time.  5/5 motor in all extremities. Sensation is intact. No saddle anesthesia. Ambulation without difficulty  Skin: Skin is warm and dry. No rash noted. No erythema.  Psychiatric: She has a normal mood and affect. Her behavior is  normal.    ED Course  Procedures (including critical care time) Labs Review Labs Reviewed  URINALYSIS, ROUTINE W REFLEX MICROSCOPIC - Abnormal; Notable for the following:    Hgb urine dipstick SMALL (*)    All other components within normal limits  URINE MICROSCOPIC-ADD ON    Imaging Review Dg Lumbar Spine Complete  02/09/2014   CLINICAL DATA:  Left lower back pain.  EXAM: LUMBAR SPINE - COMPLETE 4+ VIEW  COMPARISON:  Lumbar spine radiographs performed 08/05/2008  FINDINGS: There is no evidence of fracture or subluxation. Vertebral bodies demonstrate normal height and alignment. Intervertebral disc spaces are preserved. The visualized neural foramina are grossly unremarkable in appearance. An apparent bone island is again noted at the left ilium.  The visualized bowel gas pattern is unremarkable in appearance; air and stool are noted  within the colon. The sacroiliac joints are within normal limits. Clips are noted within the right upper quadrant, reflecting prior cholecystectomy.  IMPRESSION: No evidence of fracture or subluxation along the lumbar spine.   Electronically Signed   By: Garald Balding M.D.   On: 02/09/2014 05:32     EKG Interpretation None      MDM   Final diagnoses:  Left-sided low back pain without sciatica        Julianne Rice, MD 02/09/14 [phone]

## 2014-02-09 NOTE — Discharge Instructions (Signed)
Back Pain, Adult °Back pain is very common. The pain often gets better over time. The cause of back pain is usually not dangerous. Most people can learn to manage their back pain on their own.  °HOME CARE  °· Stay active. Start with short walks on flat ground if you can. Try to walk farther each day. °· Do not sit, drive, or stand in one place for more than 30 minutes. Do not stay in bed. °· Do not avoid exercise or work. Activity can help your back heal faster. °· Be careful when you bend or lift an object. Bend at your knees, keep the object close to you, and do not twist. °· Sleep on a firm mattress. Lie on your side, and bend your knees. If you lie on your back, put a pillow under your knees. °· Only take medicines as told by your doctor. °· Put ice on the injured area. °¨ Put ice in a plastic bag. °¨ Place a towel between your skin and the bag. °¨ Leave the ice on for 15-20 minutes, 03-04 times a day for the first 2 to 3 days. After that, you can switch between ice and heat packs. °· Ask your doctor about back exercises or massage. °· Avoid feeling anxious or stressed. Find good ways to deal with stress, such as exercise. °GET HELP RIGHT AWAY IF:  °· Your pain does not go away with rest or medicine. °· Your pain does not go away in 1 week. °· You have new problems. °· You do not feel well. °· The pain spreads into your legs. °· You cannot control when you poop (bowel movement) or pee (urinate). °· Your arms or legs feel weak or lose feeling (numbness). °· You feel sick to your stomach (nauseous) or throw up (vomit). °· You have belly (abdominal) pain. °· You feel like you may pass out (faint). °MAKE SURE YOU:  °· Understand these instructions. °· Will watch your condition. °· Will get help right away if you are not doing well or get worse. °Document Released: 12/21/2007 Document Revised: 09/26/2011 Document Reviewed: 11/05/2013 °[employer]® Patient Information ©2015 [employer]. This information is not intended  to replace advice given to you by your health care provider. Make sure you discuss any questions you have with your health care provider. ° °

## 2014-02-09 NOTE — ED Notes (Signed)
Presents with left flank pain began Friday associated with frequency of urination. Pain has progressively worsened since Friday described as ache. Nothing makes better, nothing makes worse.

## 2014-02-17 ENCOUNTER — Ambulatory Visit (INDEPENDENT_AMBULATORY_CARE_PROVIDER_SITE_OTHER): Payer: Medicare Other | Admitting: Family Medicine

## 2014-02-17 ENCOUNTER — Encounter: Payer: Self-pay | Admitting: Family Medicine

## 2014-02-17 VITALS — BP 118/64 | HR 86 | Temp 98.1°F | Ht 62.5 in | Wt 149.0 lb

## 2014-02-17 DIAGNOSIS — B029 Zoster without complications: Secondary | ICD-10-CM | POA: Insufficient documentation

## 2014-02-17 NOTE — Progress Notes (Signed)
Pre visit review using our clinic review tool, if applicable. No additional management support is needed unless otherwise documented below in the visit note. 

## 2014-02-17 NOTE — Patient Instructions (Signed)
I'm glad your shingles is getting better  Take care of yourself  Stay away from immunocompromised people  I would recommend a shingles vaccine after you are better  Finish all of the zovirax    If you are interested in a shingles/zoster vaccine - call your insurance to check on coverage,( you should not get it within 1 month of other vaccines) , then call us for a prescription  for it to take to a pharmacy that gives the shot , or make a nurse visit to get it here depending on your coverage

## 2014-02-17 NOTE — Progress Notes (Signed)
Subjective:    Patient ID: Diane Delacruz, female    DOB: 12-25-1945, 68 y.o.   MRN: [id number]  HPI Here for f/u of ER visit for shingles   Went to ER early Sunday am - with back pain that started the day before  Nothing to find- and xrays were normal  Then pain got worse and wrapped around the side -- and then started breaking out on tues eve and then wed  Knew what it was   Was seen in [place] [facility]  Zovirax 800 to take 5 times day  8 days   They gave her a pain shot while she was there - really helped  Doing pretty good now - some itching / not a lot of pain / sensitive  Is tired/ no fever   Patient Active Problem List   Diagnosis Date Noted  . Shingles 02/17/2014  . Neck pain 10/08/2013  . Acute bacterial sinusitis 08/20/2013  . Encounter for Medicare annual wellness exam 11/07/2012  . TMJ (temporomandibular joint syndrome) 02/24/2012  . Insomnia 02/21/2011  . Family history of colon cancer 11/01/2010  . Hyperlipidemia 11/01/2010  . VITAMIN D DEFICIENCY 10/10/2008  . IRRITABLE BOWEL SYNDROME 10/15/2007  . VARICOSE VEINS, LOWER EXTREMITIES 08/15/2007  . OSTEOPENIA 05/14/2007  . DEPRESSION 05/11/2007  . ALLERGIC RHINITIS, SEASONAL 05/11/2007  . GERD 05/11/2007  . FIBROCYSTIC BREAST DISEASE 05/11/2007  . POSTMENOPAUSAL STATUS 05/11/2007   Past Medical History  Diagnosis Date  . Depression   . CAD (coronary artery disease)   . Palpitations 03/06/09    event monitor  . Unspecified vitamin D deficiency   . Hypoglycemia, unspecified   . Lumbago   . Irritable bowel syndrome   . Unspecified adverse effect of other drug, medicinal and biological substance(995.29)   . Asymptomatic varicose veins   . Pure hypercholesterolemia   . Diffuse cystic mastopathy   . Allergic rhinitis due to pollen   . Symptomatic menopausal or female climacteric states   . Esophageal reflux   . Dyspnea 2008    negative echo and MV scan  . DDD (degenerative disc disease)     in neck,  neurosurgeon Dr. Trenton Gammon  . Stress fracture of foot 03/28/99    left  . Bladder tumor 11/05  . Diverticulosis   . History of gallstones    Past Surgical History  Procedure Laterality Date  . Cholecystectomy      gallstones- ultrasound 10/03  . Bladder surgery      tumor removed   History  Substance Use Topics  . Smoking status: Never Smoker   . Smokeless tobacco: Never Used  . Alcohol Use: Yes     Comment: Rare-wine   Family History  Problem Relation Age of Onset  . Hyperlipidemia Mother   . Stroke Mother   . Arthritis Mother   . Macular degeneration Mother   . Irritable bowel syndrome Mother   . Colon cancer Father   . Heart disease Father     MI's  . COPD Sister     heavy smoker  . Colon polyps Maternal Aunt   . Breast cancer Paternal Aunt   . Osteopenia Sister   . Other Daughter     celiac spruce  . Diabetes Brother   . Diabetes Paternal Uncle   . Irritable bowel syndrome Sister   . Kidney disease Cousin   . Rectal cancer Neg Hx   . Stomach cancer Neg Hx    Allergies  Allergen Reactions  .  Amoxicillin     Rash and diarrhea   . Codeine     REACTION: unknown reaction  . Flexeril [Cyclobenzaprine]     sedation  . Klonopin [Clonazepam]     Dizziness , fatigue  . Nsaids     REACTION: diarrhea  . Omeprazole     REACTION: did not work for her  . Pseudoephedrine     REACTION: jittery   Current Outpatient Prescriptions on File Prior to Visit  Medication Sig Dispense Refill  . acetaminophen (TYLENOL) 500 MG tablet Take 1,000 mg by mouth every 6 (six) hours as needed for mild pain.       . DULoxetine (CYMBALTA) 30 MG capsule Take 30 mg by mouth daily.      Marland Kitchen esomeprazole (NEXIUM) 40 MG capsule Take 40 mg by mouth daily at 12 noon.      . fexofenadine (ALLEGRA) 60 MG tablet Take 180 mg by mouth daily as needed for allergies.       Marland Kitchen HYDROcodone-acetaminophen (NORCO) 5-325 MG per tablet Take 1 tablet by mouth every 4 (four) hours as needed.  10 tablet  0  .  mometasone (NASONEX) 50 MCG/ACT nasal spray Place 2 sprays into the nose daily as needed (allergies).       . pravastatin (PRAVACHOL) 20 MG tablet Take 1 tablet (20 mg total) by mouth daily.  30 tablet  5   No current facility-administered medications on file prior to visit.    Review of Systems    Review of Systems  Constitutional: Negative for fever, appetite change, fatigue and unexpected weight change.  Eyes: Negative for pain and visual disturbance.  Respiratory: Negative for cough and shortness of breath.   Cardiovascular: Negative for cp or palpitations    Gastrointestinal: Negative for nausea, diarrhea and constipation.  Genitourinary: Negative for urgency and frequency.  Skin: Negative for pallor , and pos for rash that is painful and itchy Neurological: Negative for weakness, light-headedness, numbness and headaches.  Hematological: Negative for adenopathy. Does not bruise/bleed easily.  Psychiatric/Behavioral: Negative for dysphoric mood. The patient is not nervous/anxious.      Objective:   Physical Exam  Constitutional: She appears well-developed and well-nourished. No distress.  overwt and well app  HENT:  Head: Normocephalic and atraumatic.  Eyes: Conjunctivae and EOM are normal. Pupils are equal, round, and reactive to light.  Neck: Normal range of motion. Neck supple.  Cardiovascular: Normal rate and regular rhythm.   Pulmonary/Chest: Effort normal and breath sounds normal.  Abdominal: Soft. Bowel sounds are normal. She exhibits no distension. There is no tenderness.  Musculoskeletal: She exhibits no edema.  Lymphadenopathy:    She has no cervical adenopathy.  Neurological: She is alert.  Skin: Skin is warm and dry. Rash noted.  Erythematous rash - in patches of vesicles - L side over dermatomes T 12 to L1  No excoriation or signs of bacterial infx           Assessment & Plan:   Problem List Items Addressed This Visit     Other   Shingles - Primary      Finishing zovirax Rev ER notes  Doing very well Has norco-has not needed it  Disc skin care  Will check into coverage for zoster vaccine

## 2014-02-17 NOTE — Assessment & Plan Note (Signed)
Finishing zovirax Rev ER notes  Doing very well Has norco-has not needed it  Disc skin care  Will check into coverage for zoster vaccine

## 2014-02-27 ENCOUNTER — Encounter: Payer: Self-pay | Admitting: Gastroenterology

## 2014-03-06 ENCOUNTER — Other Ambulatory Visit: Payer: Self-pay | Admitting: Family Medicine

## 2014-03-06 NOTE — Telephone Encounter (Signed)
Please refill for 6 mo 

## 2014-03-06 NOTE — Telephone Encounter (Signed)
Electronic refill request, please advise  

## 2014-03-07 NOTE — Telephone Encounter (Signed)
done

## 2014-04-16 ENCOUNTER — Telehealth: Payer: Self-pay | Admitting: Family Medicine

## 2014-04-16 DIAGNOSIS — E559 Vitamin D deficiency, unspecified: Secondary | ICD-10-CM

## 2014-04-16 DIAGNOSIS — Z0000 Encounter for general adult medical examination without abnormal findings: Secondary | ICD-10-CM

## 2014-04-16 DIAGNOSIS — E785 Hyperlipidemia, unspecified: Secondary | ICD-10-CM

## 2014-04-16 DIAGNOSIS — M949 Disorder of cartilage, unspecified: Principal | ICD-10-CM

## 2014-04-16 DIAGNOSIS — M899 Disorder of bone, unspecified: Secondary | ICD-10-CM

## 2014-04-16 NOTE — Telephone Encounter (Signed)
Message copied by Abner Greenspan on Wed Apr 16, 2014 10:11 PM ------      Message from: Ellamae Sia      Created: Thu Apr 10, 2014  2:14 PM      Regarding: Lab orders for [facility], 10.1.15       Patient is scheduled for CPX labs, please order future labs, Thanks , Diane Delacruz       ------

## 2014-04-17 ENCOUNTER — Other Ambulatory Visit (INDEPENDENT_AMBULATORY_CARE_PROVIDER_SITE_OTHER): Payer: Medicare Other

## 2014-04-17 DIAGNOSIS — M899 Disorder of bone, unspecified: Secondary | ICD-10-CM

## 2014-04-17 DIAGNOSIS — E785 Hyperlipidemia, unspecified: Secondary | ICD-10-CM

## 2014-04-17 DIAGNOSIS — M949 Disorder of cartilage, unspecified: Secondary | ICD-10-CM

## 2014-04-17 DIAGNOSIS — E559 Vitamin D deficiency, unspecified: Secondary | ICD-10-CM

## 2014-04-17 DIAGNOSIS — Z0000 Encounter for general adult medical examination without abnormal findings: Secondary | ICD-10-CM

## 2014-04-17 LAB — VITAMIN D 25 HYDROXY (VIT D DEFICIENCY, FRACTURES): VITD: 50.1 ng/mL (ref 30.00–100.00)

## 2014-04-17 LAB — CBC WITH DIFFERENTIAL/PLATELET
BASOS PCT: 0.6 % (ref 0.0–3.0)
Basophils Absolute: 0 10*3/uL (ref 0.0–0.1)
EOS PCT: 6.1 % — AB (ref 0.0–5.0)
Eosinophils Absolute: 0.4 10*3/uL (ref 0.0–0.7)
HEMATOCRIT: 40.8 % (ref 36.0–46.0)
Hemoglobin: 13.7 g/dL (ref 12.0–15.0)
LYMPHS ABS: 2.7 10*3/uL (ref 0.7–4.0)
Lymphocytes Relative: 42.6 % (ref 12.0–46.0)
MCHC: 33.7 g/dL (ref 30.0–36.0)
MCV: 94.6 fl (ref 78.0–100.0)
MONOS PCT: 6.9 % (ref 3.0–12.0)
Monocytes Absolute: 0.4 10*3/uL (ref 0.1–1.0)
NEUTROS ABS: 2.7 10*3/uL (ref 1.4–7.7)
Neutrophils Relative %: 43.8 % (ref 43.0–77.0)
Platelets: 327 10*3/uL (ref 150.0–400.0)
RBC: 4.32 Mil/uL (ref 3.87–5.11)
RDW: 13.5 % (ref 11.5–15.5)
WBC: 6.3 10*3/uL (ref 4.0–10.5)

## 2014-04-17 LAB — COMPREHENSIVE METABOLIC PANEL
ALT: 17 U/L (ref 0–35)
AST: 24 U/L (ref 0–37)
Albumin: 3.9 g/dL (ref 3.5–5.2)
Alkaline Phosphatase: 96 U/L (ref 39–117)
BUN: 14 mg/dL (ref 6–23)
CALCIUM: 9.5 mg/dL (ref 8.4–10.5)
CO2: 27 meq/L (ref 19–32)
CREATININE: 1 mg/dL (ref 0.4–1.2)
Chloride: 106 mEq/L (ref 96–112)
GFR: 57.96 mL/min — AB (ref >60.00)
GLUCOSE: 94 mg/dL (ref 70–99)
Potassium: 4.5 mEq/L (ref 3.5–5.1)
Sodium: 139 mEq/L (ref 135–145)
Total Bilirubin: 0.6 mg/dL (ref 0.2–1.2)
Total Protein: 7.1 g/dL (ref 6.0–8.3)

## 2014-04-17 LAB — LIPID PANEL
CHOLESTEROL: 196 mg/dL (ref 0–200)
HDL: 84.4 mg/dL (ref >39.00)
LDL Cholesterol: 96 mg/dL (ref 0–99)
NonHDL: 111.6
Total CHOL/HDL Ratio: 2
Triglycerides: 80 mg/dL (ref 0.0–149.0)
VLDL: 16 mg/dL (ref 0.0–40.0)

## 2014-04-17 LAB — TSH: TSH: 1.87 u[IU]/mL (ref 0.35–4.50)

## 2014-04-22 ENCOUNTER — Other Ambulatory Visit: Payer: Self-pay

## 2014-04-22 DIAGNOSIS — Z1239 Encounter for other screening for malignant neoplasm of breast: Secondary | ICD-10-CM

## 2014-04-25 ENCOUNTER — Encounter: Payer: Self-pay | Admitting: Family Medicine

## 2014-04-25 ENCOUNTER — Ambulatory Visit (INDEPENDENT_AMBULATORY_CARE_PROVIDER_SITE_OTHER): Payer: Medicare Other | Admitting: Family Medicine

## 2014-04-25 VITALS — BP 108/64 | HR 80 | Temp 97.8°F | Ht 62.25 in | Wt 149.0 lb

## 2014-04-25 DIAGNOSIS — Z23 Encounter for immunization: Secondary | ICD-10-CM

## 2014-04-25 DIAGNOSIS — E559 Vitamin D deficiency, unspecified: Secondary | ICD-10-CM

## 2014-04-25 DIAGNOSIS — M8580 Other specified disorders of bone density and structure, unspecified site: Secondary | ICD-10-CM | POA: Insufficient documentation

## 2014-04-25 DIAGNOSIS — Z0000 Encounter for general adult medical examination without abnormal findings: Secondary | ICD-10-CM

## 2014-04-25 DIAGNOSIS — E785 Hyperlipidemia, unspecified: Secondary | ICD-10-CM

## 2014-04-25 MED ORDER — MOMETASONE FUROATE 50 MCG/ACT NA SUSP: 2.0000 | Freq: Every day | NASAL | Status: DC | PRN

## 2014-04-25 MED ORDER — ESOMEPRAZOLE MAGNESIUM 40 MG PO CPDR: 40.0000 mg | DELAYED_RELEASE_CAPSULE | Freq: Every day | ORAL | Status: DC

## 2014-04-25 MED ORDER — DULOXETINE HCL 30 MG PO CPEP: ORAL_CAPSULE | ORAL | Status: DC

## 2014-04-25 MED ORDER — PRAVASTATIN SODIUM 20 MG PO TABS: 20.0000 mg | ORAL_TABLET | Freq: Every day | ORAL | Status: DC

## 2014-04-25 NOTE — Assessment & Plan Note (Signed)
Vitamin D level is therapeutic with current supplementation Disc importance of this to bone and overall health  

## 2014-04-25 NOTE — Assessment & Plan Note (Signed)
Due for 2 y dexa No falls or fx  D level is therapeutic

## 2014-04-25 NOTE — Progress Notes (Signed)
Subjective:    Patient ID: Diane Delacruz, female    DOB: 1946-05-28, 68 y.o.   MRN: [id number]  HPI I have personally reviewed the Medicare Annual Wellness questionnaire and have noted 1. The patient's medical and social history 2. Their use of alcohol, tobacco or illicit drugs 3. Their current medications and supplements 4. The patient's functional ability including ADL's, fall risks, home safety risks and hearing or visual             impairment. 5. Diet and physical activities 6. Evidence for depression or mood disorders  The patients weight, height, BMI have been recorded in the chart and visual acuity is per eye clinic.  I have made referrals, counseling and provided education to the patient based review of the above and I have provided the pt with a written personalized care plan for preventive services.  Doing well overall  A little bit of a cold today-not bad/ poss allergies also    See scanned forms.  Routine anticipatory guidance given to patient.  See health maintenance. Colon cancer screening 6/13 - 5 year recall  Breast cancer screening -scheduled for that on Monday - was overdue  Self breast exam -no lumps  Nl pap 4/13 -no abn paps/ no new partners and no symptoms  Flu vaccine - will get today  Tetanus vaccine Td 4/04 - will get that at the health dept  Pneumovax 4/13  Will get prevar  Zoster vaccine- has not had the vaccine yet   Advance directive-does not have it yet-given handouts on it  Cognitive function addressed- see scanned forms- and if abnormal then additional documentation follows. - no problems   PMH and SH reviewed  Meds, vitals, and allergies reviewed.   ROS: See HPI.  Otherwise negative.     Osteopenia 5/11 dexa Is due for one  Pt wants to see if she could get it on the same day as her mammogram  No fractures in the past year   Hyperlipidemia Lab Results  Component Value Date   CHOL 196 04/17/2014   CHOL 182 11/08/2012   CHOL 170  06/29/2012   Lab Results  Component Value Date   HDL 84.40 04/17/2014   HDL 72.90 11/08/2012   HDL 71.80 06/29/2012   Lab Results  Component Value Date   LDLCALC 96 04/17/2014   LDLCALC 96 11/08/2012   LDLCALC 84 06/29/2012   Lab Results  Component Value Date   TRIG 80.0 04/17/2014   TRIG 65.0 11/08/2012   TRIG 69.0 06/29/2012   Lab Results  Component Value Date   CHOLHDL 2 04/17/2014   CHOLHDL 2 11/08/2012   CHOLHDL 2 06/29/2012   Lab Results  Component Value Date   LDLDIRECT 170.5 04/30/2012   LDLDIRECT 144.6 10/31/2011   LDLDIRECT 137.0 12/01/2010   very good control  HDL continues to go up  Is more active in the summer /spring - outdoor work   Patient Active Problem List   Diagnosis Date Noted  . Osteopenia 04/25/2014  . Shingles 02/17/2014  . Neck pain 10/08/2013  . Encounter for Medicare annual wellness exam 11/07/2012  . TMJ (temporomandibular joint syndrome) 02/24/2012  . Insomnia 02/21/2011  . Family history of colon cancer 11/01/2010  . Hyperlipidemia 11/01/2010  . Vitamin D deficiency 10/10/2008  . IRRITABLE BOWEL SYNDROME 10/15/2007  . VARICOSE VEINS, LOWER EXTREMITIES 08/15/2007  . Disorder of bone and cartilage 05/14/2007  . DEPRESSION 05/11/2007  . ALLERGIC RHINITIS, SEASONAL 05/11/2007  . GERD 05/11/2007  .  FIBROCYSTIC BREAST DISEASE 05/11/2007  . POSTMENOPAUSAL STATUS 05/11/2007   Past Medical History  Diagnosis Date  . Depression   . CAD (coronary artery disease)   . Palpitations 03/06/09    event monitor  . Unspecified vitamin D deficiency   . Hypoglycemia, unspecified   . Lumbago   . Irritable bowel syndrome   . Unspecified adverse effect of other drug, medicinal and biological substance(995.29)   . Asymptomatic varicose veins   . Pure hypercholesterolemia   . Diffuse cystic mastopathy   . Allergic rhinitis due to pollen   . Symptomatic menopausal or female climacteric states   . Esophageal reflux   . Dyspnea 2008    negative echo and  MV scan  . DDD (degenerative disc disease)     in neck, neurosurgeon Dr. Trenton Gammon  . Stress fracture of foot 03/28/99    left  . Bladder tumor 11/05  . Diverticulosis   . History of gallstones    Past Surgical History  Procedure Laterality Date  . Cholecystectomy      gallstones- ultrasound 10/03  . Bladder surgery      tumor removed   History  Substance Use Topics  . Smoking status: Never Smoker   . Smokeless tobacco: Never Used  . Alcohol Use: Yes     Comment: Rare-wine   Family History  Problem Relation Age of Onset  . Hyperlipidemia Mother   . Stroke Mother   . Arthritis Mother   . Macular degeneration Mother   . Irritable bowel syndrome Mother   . Colon cancer Father   . Heart disease Father     MI's  . COPD Sister     heavy smoker  . Colon polyps Maternal Aunt   . Breast cancer Paternal Aunt   . Osteopenia Sister   . Other Daughter     celiac spruce  . Diabetes Brother   . Diabetes Paternal Uncle   . Irritable bowel syndrome Sister   . Kidney disease Cousin   . Rectal cancer Neg Hx   . Stomach cancer Neg Hx    Allergies  Allergen Reactions  . Amoxicillin     Rash and diarrhea   . Codeine     REACTION: unknown reaction  . Flexeril [Cyclobenzaprine]     sedation  . Klonopin [Clonazepam]     Dizziness , fatigue  . Nsaids     REACTION: diarrhea  . Omeprazole     REACTION: did not work for her  . Pseudoephedrine     REACTION: jittery   Current Outpatient Prescriptions on File Prior to Visit  Medication Sig Dispense Refill  . acetaminophen (TYLENOL) 500 MG tablet Take 1,000 mg by mouth every 6 (six) hours as needed for mild pain.       . Calcium-Magnesium-Vitamin D (CALCIUM 1200+D3 PO) Take 1 tablet by mouth daily.      . fexofenadine (ALLEGRA) 60 MG tablet Take 180 mg by mouth daily as needed for allergies.       Marland Kitchen HYDROcodone-acetaminophen (NORCO) 5-325 MG per tablet Take 1 tablet by mouth every 4 (four) hours as needed.  10 tablet  0   No current  facility-administered medications on file prior to visit.    Review of Systems Review of Systems  Constitutional: Negative for fever, appetite change, fatigue and unexpected weight change.  Eyes: Negative for pain and visual disturbance.  Respiratory: Negative for cough and shortness of breath.   Cardiovascular: Negative for cp or palpitations  Gastrointestinal: Negative for nausea, diarrhea and constipation.  Genitourinary: Negative for urgency and frequency.  Skin: Negative for pallor or rash   Neurological: Negative for weakness, light-headedness, numbness and headaches.  Hematological: Negative for adenopathy. Does not bruise/bleed easily.  Psychiatric/Behavioral: Negative for dysphoric mood. The patient is not nervous/anxious.         Objective:   Physical Exam  Constitutional: She appears well-developed and well-nourished. No distress.  overwt and well app  HENT:  Head: Normocephalic and atraumatic.  Right Ear: External ear normal.  Left Ear: External ear normal.  Mouth/Throat: Oropharynx is clear and moist.  Eyes: Conjunctivae and EOM are normal. Pupils are equal, round, and reactive to light. No scleral icterus.  Neck: Normal range of motion. Neck supple. No JVD present. Carotid bruit is not present. No thyromegaly present.  Cardiovascular: Normal rate, regular rhythm, normal heart sounds and intact distal pulses.  Exam reveals no gallop.   LE varicosities baseline   Pulmonary/Chest: Effort normal and breath sounds normal. No respiratory distress. She has no wheezes. She exhibits no tenderness.  Abdominal: Soft. Bowel sounds are normal. She exhibits no distension, no abdominal bruit and no mass. There is no tenderness.  Genitourinary: No breast swelling, tenderness, discharge or bleeding.  Musculoskeletal: Normal range of motion. She exhibits no edema and no tenderness.  No kyphosis   Lymphadenopathy:    She has no cervical adenopathy.  Neurological: She is alert. She  has normal reflexes. No cranial nerve deficit. She exhibits normal muscle tone. Coordination normal.  Skin: Skin is warm and dry. No rash noted. No erythema. No pallor.  Psychiatric: She has a normal mood and affect.          Assessment & Plan:   Problem List Items Addressed This Visit     Musculoskeletal and Integument   Osteopenia     Due for 2 y dexa No falls or fx  D level is therapeutic      Relevant Orders      DG Bone Density     Other   Vitamin D deficiency     Vitamin D level is therapeutic with current supplementation Disc importance of this to bone and overall health     Hyperlipidemia     Disc goals for lipids and reasons to control them Rev labs with pt Rev low sat fat diet in detail Very well controlled on pravastatin and diet      Relevant Medications      pravastatin (PRAVACHOL) tablet   Encounter for Medicare annual wellness exam - Primary     Reviewed health habits including diet and exercise and skin cancer prevention Reviewed appropriate screening tests for age  Also reviewed health mt list, fam hx and immunization status , as well as social and family history   See HPI Labs reviewed in detail  Flu and prevnar vaccines today Given literature for help making an advanced directive Mammogram planned Monday  Also scheduling dexa  Enc good health habits      Other Visit Diagnoses   Need for prophylactic vaccination and inoculation against influenza        Relevant Orders       Flu Vaccine QUAD 36+ mos PF IM (Fluarix Quad PF) (Completed)    Need for vaccination with 13-polyvalent pneumococcal conjugate vaccine        Relevant Orders       Pneumococcal conjugate vaccine 13-valent (Completed)

## 2014-04-25 NOTE — Assessment & Plan Note (Signed)
Reviewed health habits including diet and exercise and skin cancer prevention Reviewed appropriate screening tests for age  Also reviewed health mt list, fam hx and immunization status , as well as social and family history   See HPI Labs reviewed in detail  Flu and prevnar vaccines today Given literature for help making an advanced directive Mammogram planned Monday  Also scheduling dexa  Enc good health habits

## 2014-04-25 NOTE — Patient Instructions (Signed)
Flu vaccine today  prevnar vaccine today  If you are interested in a shingles/zoster vaccine - call your insurance to check on coverage,( you should not get it within 1 month of other vaccines) , then call us for a prescription  for it to take to a pharmacy that gives the shot , or make a nurse visit to get it here depending on your coverage   For a tetanus shot (Tdap)- go to the health department, it will be more affordable   Please work on an advanced directive (it is important for anybody)  Stop at check out to schedule bone density test

## 2014-04-25 NOTE — Progress Notes (Signed)
Pre visit review using our clinic review tool, if applicable. No additional management support is needed unless otherwise documented below in the visit note. 

## 2014-04-25 NOTE — Assessment & Plan Note (Signed)
Disc goals for lipids and reasons to control them Rev labs with pt Rev low sat fat diet in detail Very well controlled on pravastatin and diet

## 2014-04-28 ENCOUNTER — Ambulatory Visit: Payer: Medicare Other

## 2014-05-08 ENCOUNTER — Other Ambulatory Visit: Payer: Self-pay

## 2014-05-08 DIAGNOSIS — Z1231 Encounter for screening mammogram for malignant neoplasm of breast: Secondary | ICD-10-CM

## 2014-05-14 ENCOUNTER — Other Ambulatory Visit: Payer: Medicare Other

## 2014-05-14 ENCOUNTER — Ambulatory Visit: Payer: Medicare Other

## 2014-06-03 ENCOUNTER — Ambulatory Visit: Payer: Medicare Other

## 2014-06-03 ENCOUNTER — Other Ambulatory Visit: Payer: Medicare Other

## 2014-09-02 ENCOUNTER — Encounter: Payer: Self-pay | Admitting: Family Medicine

## 2014-09-02 ENCOUNTER — Ambulatory Visit (INDEPENDENT_AMBULATORY_CARE_PROVIDER_SITE_OTHER): Payer: Medicare Other | Admitting: Family Medicine

## 2014-09-02 VITALS — BP 110/72 | HR 78 | Temp 98.9°F | Ht 62.5 in | Wt 146.8 lb

## 2014-09-02 DIAGNOSIS — J209 Acute bronchitis, unspecified: Secondary | ICD-10-CM

## 2014-09-02 MED ORDER — HYDROCODONE-HOMATROPINE 5-1.5 MG/5ML PO SYRP: 5.0000 mL | ORAL_SOLUTION | Freq: Three times a day (TID) | ORAL | Status: DC | PRN

## 2014-09-02 MED ORDER — AZITHROMYCIN 250 MG PO TABS: ORAL_TABLET | ORAL | Status: DC

## 2014-09-02 MED ORDER — MOMETASONE FUROATE 50 MCG/ACT NA SUSP: 2.0000 | Freq: Every day | NASAL | Status: DC | PRN

## 2014-09-02 NOTE — Assessment & Plan Note (Signed)
Cover with zpak in light of duration - 1 mo  Hycodan for cyclic cough- with caution of sedation  Fluids/rest Disc symptomatic care - see instructions on AVS  Update if not starting to improve in a week or if worsening

## 2014-09-02 NOTE — Progress Notes (Signed)
Pre visit review using our clinic review tool, if applicable. No additional management support is needed unless otherwise documented below in the visit note. 

## 2014-09-02 NOTE — Progress Notes (Signed)
Subjective:    Patient ID: Diane Delacruz, female    DOB: 12-01-1945, 69 y.o.   MRN: [id number]  HPI Here with a cough - driving her crazy  Has had it for at least 2 weeks Taking otc med - clorcedin and delsym - just not doing well with it  No wheeze today -perhaps a bit of rattling  Is keeping her up at night  Cough is sometimes productive -mucous is yellow / green  Now has some nasal congestion  No facial pain / a bit of headache  No fever  Throat- irritated from coughing  Ears are a little stuffy  Patient Active Problem List   Diagnosis Date Noted  . Osteopenia 04/25/2014  . Shingles 02/17/2014  . Neck pain 10/08/2013  . Encounter for Medicare annual wellness exam 11/07/2012  . TMJ (temporomandibular joint syndrome) 02/24/2012  . Insomnia 02/21/2011  . Family history of colon cancer 11/01/2010  . Hyperlipidemia 11/01/2010  . Vitamin D deficiency 10/10/2008  . IRRITABLE BOWEL SYNDROME 10/15/2007  . VARICOSE VEINS, LOWER EXTREMITIES 08/15/2007  . Disorder of bone and cartilage 05/14/2007  . DEPRESSION 05/11/2007  . ALLERGIC RHINITIS, SEASONAL 05/11/2007  . GERD 05/11/2007  . FIBROCYSTIC BREAST DISEASE 05/11/2007  . POSTMENOPAUSAL STATUS 05/11/2007   Past Medical History  Diagnosis Date  . Depression   . CAD (coronary artery disease)   . Palpitations 03/06/09    event monitor  . Unspecified vitamin D deficiency   . Hypoglycemia, unspecified   . Lumbago   . Irritable bowel syndrome   . Unspecified adverse effect of other drug, medicinal and biological substance(995.29)   . Asymptomatic varicose veins   . Pure hypercholesterolemia   . Diffuse cystic mastopathy   . Allergic rhinitis due to pollen   . Symptomatic menopausal or female climacteric states   . Esophageal reflux   . Dyspnea 2008    negative echo and MV scan  . DDD (degenerative disc disease)     in neck, neurosurgeon Dr. Trenton Gammon  . Stress fracture of foot 03/28/99    left  . Bladder tumor 11/05  .  Diverticulosis   . History of gallstones    Past Surgical History  Procedure Laterality Date  . Cholecystectomy      gallstones- ultrasound 10/03  . Bladder surgery      tumor removed   History  Substance Use Topics  . Smoking status: Never Smoker   . Smokeless tobacco: Never Used  . Alcohol Use: Yes     Comment: Rare-wine   Family History  Problem Relation Age of Onset  . Hyperlipidemia Mother   . Stroke Mother   . Arthritis Mother   . Macular degeneration Mother   . Irritable bowel syndrome Mother   . Colon cancer Father   . Heart disease Father     MI's  . COPD Sister     heavy smoker  . Colon polyps Maternal Aunt   . Breast cancer Paternal Aunt   . Osteopenia Sister   . Other Daughter     celiac spruce  . Diabetes Brother   . Diabetes Paternal Uncle   . Irritable bowel syndrome Sister   . Kidney disease Cousin   . Rectal cancer Neg Hx   . Stomach cancer Neg Hx    Allergies  Allergen Reactions  . Amoxicillin     Rash and diarrhea   . Codeine     REACTION: unknown reaction  . Flexeril [Cyclobenzaprine]  sedation  . Klonopin [Clonazepam]     Dizziness , fatigue  . Nsaids     REACTION: diarrhea  . Omeprazole     REACTION: did not work for her  . Pseudoephedrine     REACTION: jittery   Current Outpatient Prescriptions on File Prior to Visit  Medication Sig Dispense Refill  . acetaminophen (TYLENOL) 500 MG tablet Take 1,000 mg by mouth every 6 (six) hours as needed for mild pain.     . Calcium-Magnesium-Vitamin D (CALCIUM 1200+D3 PO) Take 1 tablet by mouth daily.    . DULoxetine (CYMBALTA) 30 MG capsule TAKE 1 CAPSULE (30 MG TOTAL) BY MOUTH DAILY. 30 capsule 11  . esomeprazole (NEXIUM) 40 MG capsule Take 1 capsule (40 mg total) by mouth daily at 12 noon. (Patient not taking: Reported on 09/02/2014) 30 capsule 11  . fexofenadine (ALLEGRA) 60 MG tablet Take 180 mg by mouth daily as needed for allergies.     . mometasone (NASONEX) 50 MCG/ACT nasal spray  Place 2 sprays into the nose daily as needed (allergies). 17 g 11  . pravastatin (PRAVACHOL) 20 MG tablet Take 1 tablet (20 mg total) by mouth daily. 30 tablet 11   No current facility-administered medications on file prior to visit.     Review of Systems Review of Systems  Constitutional: Negative for fever, appetite change,  and unexpected weight change.  Eyes: Negative for pain and visual disturbance.  ENT pos for congestion and rhinorrhea without facial pain  Respiratory: Negative for wheeze and shortness of breath.   Cardiovascular: Negative for cp or palpitations    Gastrointestinal: Negative for nausea, diarrhea and constipation.  Genitourinary: Negative for urgency and frequency.  Skin: Negative for pallor or rash   Neurological: Negative for weakness, light-headedness, numbness and headaches.  Hematological: Negative for adenopathy. Does not bruise/bleed easily.  Psychiatric/Behavioral: Negative for dysphoric mood. The patient is not nervous/anxious.         Objective:   Physical Exam  Constitutional: She appears well-developed and well-nourished. No distress.  HENT:  Head: Normocephalic and atraumatic.  Right Ear: External ear normal.  Left Ear: External ear normal.  Mouth/Throat: Oropharynx is clear and moist. No oropharyngeal exudate.  Nares are injected and congested  No sinus tenderness   Eyes: Conjunctivae and EOM are normal. Pupils are equal, round, and reactive to light. Right eye exhibits no discharge. Left eye exhibits no discharge.  Neck: Normal range of motion. Neck supple.  Cardiovascular: Normal rate and regular rhythm.   Pulmonary/Chest: Effort normal and breath sounds normal. No respiratory distress. She has no wheezes. She has no rales.  Harsh bs with scattered rhonchi No rales  Good air exch  Lymphadenopathy:    She has no cervical adenopathy.  Skin: Skin is warm and dry. No rash noted.  Psychiatric: She has a normal mood and affect.            Assessment & Plan:   Problem List Items Addressed This Visit      Respiratory   Acute bronchitis - Primary    Cover with zpak in light of duration - 1 mo  Hycodan for cyclic cough- with caution of sedation  Fluids/rest Disc symptomatic care - see instructions on AVS  Update if not starting to improve in a week or if worsening

## 2014-09-02 NOTE — Patient Instructions (Signed)
Take zithromax for bronchitis  Try hycodan for cough - with caution of sedation  Drink lots of water/ breathe steam  Update if not starting to improve in a week or if worsening

## 2015-01-02 ENCOUNTER — Encounter: Payer: Self-pay | Admitting: *Deleted

## 2015-02-10 ENCOUNTER — Telehealth: Payer: Self-pay

## 2015-02-10 NOTE — Telephone Encounter (Signed)
Called to notify patient of being due for a Mammogram. Patient would like to schedule an appointment with [facility]. I provided them with the contact information, and patient stated that she would call and set up an appointment.

## 2015-03-05 ENCOUNTER — Encounter: Payer: Self-pay | Admitting: Family Medicine

## 2015-03-05 ENCOUNTER — Ambulatory Visit (INDEPENDENT_AMBULATORY_CARE_PROVIDER_SITE_OTHER): Payer: Medicare Other | Admitting: Family Medicine

## 2015-03-05 VITALS — BP 90/60 | HR 80 | Temp 98.4°F | Ht 62.25 in | Wt 149.5 lb

## 2015-03-05 DIAGNOSIS — S338XXA Sprain of other parts of lumbar spine and pelvis, initial encounter: Secondary | ICD-10-CM | POA: Diagnosis not present

## 2015-03-05 DIAGNOSIS — S39013A Strain of muscle, fascia and tendon of pelvis, initial encounter: Secondary | ICD-10-CM

## 2015-03-05 NOTE — Progress Notes (Signed)
Dr. Frederico Hamman T. Arvie Bartholomew, MD, [facility] [place], [address] Phone: [phone] Fax: [phone]  03/05/2015  Patient: Diane Delacruz, MRN: [id number], DOB: Oct 02, 1945, 69 y.o.  Primary Physician:  Loura Pardon, MD  Chief Complaint: Hip Pain  Subjective:   Diane Delacruz is a 69 y.o. very pleasant female patient who presents with the following:  DOI 03/01/2015  Sunday, leaned down and got a catch in her posterior back and it is not ok. A few days ago.  No radicular symptoms. She denies any focal groin pain.  She denies any pain in the thoracic or lumbar region.  Primarily she is having muscular discomfort and pain in the posterior pelvis region on the left.  No radiculopathy or numbness.  No weakness.  L sided posterior pelvis strain.  Past Medical History, Surgical History, Social History, Family History, Problem List, Medications, and Allergies have been reviewed and updated if relevant.  Patient Active Problem List   Diagnosis Date Noted  . Acute bronchitis 09/02/2014  . Osteopenia 04/25/2014  . Shingles 02/17/2014  . Neck pain 10/08/2013  . Encounter for Medicare annual wellness exam 11/07/2012  . TMJ (temporomandibular joint syndrome) 02/24/2012  . Insomnia 02/21/2011  . Family history of colon cancer 11/01/2010  . Hyperlipidemia 11/01/2010  . Vitamin D deficiency 10/10/2008  . IRRITABLE BOWEL SYNDROME 10/15/2007  . VARICOSE VEINS, LOWER EXTREMITIES 08/15/2007  . Disorder of bone and cartilage 05/14/2007  . DEPRESSION 05/11/2007  . ALLERGIC RHINITIS, SEASONAL 05/11/2007  . GERD 05/11/2007  . FIBROCYSTIC BREAST DISEASE 05/11/2007  . POSTMENOPAUSAL STATUS 05/11/2007    Past Medical History  Diagnosis Date  . Depression   . CAD (coronary artery disease)   . Palpitations 03/06/09    event monitor  . Unspecified vitamin D deficiency   . Hypoglycemia, unspecified   . Lumbago   . Irritable bowel  syndrome   . Unspecified adverse effect of other drug, medicinal and biological substance(995.29)   . Asymptomatic varicose veins   . Pure hypercholesterolemia   . Diffuse cystic mastopathy   . Allergic rhinitis due to pollen   . Symptomatic menopausal or female climacteric states   . Esophageal reflux   . Dyspnea 2008    negative echo and MV scan  . DDD (degenerative disc disease)     in neck, neurosurgeon Dr. Trenton Gammon  . Stress fracture of foot 03/28/99    left  . Bladder tumor 11/05  . Diverticulosis   . History of gallstones     Past Surgical History  Procedure Laterality Date  . Cholecystectomy      gallstones- ultrasound 10/03  . Bladder surgery      tumor removed    Social History   Social History  . Marital Status: Married    Spouse Name: N/A  . Number of Children: 1  . Years of Education: N/A   Occupational History  . retired   .     Social History Main Topics  . Smoking status: Never Smoker   . Smokeless tobacco: Never Used  . Alcohol Use: Yes     Comment: Rare-wine  . Drug Use: No  . Sexual Activity: Not on file   Other Topics Concern  . 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  Hyperlipidemia Mother   . Stroke Mother   . Arthritis Mother   . Macular degeneration Mother   . Irritable bowel syndrome Mother   . Colon cancer Father   . Heart disease Father     MI's  . COPD Sister     heavy smoker  . Colon polyps Maternal Aunt   . Breast cancer Paternal Aunt   . Osteopenia Sister   . Other Daughter     celiac spruce  . Diabetes Brother   . Diabetes Paternal Uncle   . Irritable bowel syndrome Sister   . Kidney disease Cousin   . Rectal cancer Neg Hx   . Stomach cancer Neg Hx     Allergies  Allergen Reactions  . Amoxicillin     Rash and diarrhea   . Codeine     REACTION: unknown reaction  . Flexeril  [Cyclobenzaprine]     sedation  . Klonopin [Clonazepam]     Dizziness , fatigue  . Nsaids     REACTION: diarrhea  . Omeprazole     REACTION: did not work for her  . Pseudoephedrine     REACTION: jittery    Medication list reviewed and updated in full in [place].  GEN: No fevers, chills. Nontoxic. Primarily MSK c/o today. MSK: Detailed in the HPI GI: tolerating PO intake without difficulty Neuro: No numbness, parasthesias, or tingling associated. Otherwise the pertinent positives of the ROS are noted above.   Objective:   BP 90/60 mmHg  Pulse 80  Temp(Src) 98.4 F (36.9 C) (Oral)  Ht 5' 2.25" (1.581 m)  Wt 149 lb 8 oz (67.813 kg)  BMI 27.13 kg/m2  LMP 07/19/1995   GEN: Well-developed,well-nourished,in no acute distress; alert,appropriate and cooperative throughout examination HEENT: Normocephalic and atraumatic without obvious abnormalities. Ears, externally no deformities PULM: Breathing comfortably in no respiratory distress EXT: No clubbing, cyanosis, or edema PSYCH: Normally interactive. Cooperative during the interview. Pleasant. Friendly and conversant. Not anxious or depressed appearing. Normal, full affect.  Range of motion at  the waist: Flexion: normal Extension: normal Lateral bending: normal Rotation: all normal  No echymosis or edema Rises to examination table with no difficulty Gait: non antalgic  Inspection/Deformity: N Paraspinus Tenderness: minimal  B Ankle Dorsiflexion (L5,4): 5/5 B Great Toe Dorsiflexion (L5,4): 5/5 Heel Walk (L5): WNL Toe Walk (S1): WNL Rise/Squat (L4): WNL  SENSORY B Medial Foot (L4): WNL B Dorsum (L5): WNL B Lateral (S1): WNL Light Touch: WNL Pinprick: WNL  REFLEXES Knee (L4): 2+ Ankle (S1): 2+  B SLR, seated: neg B SLR, supine: neg B FABER: neg B Reverse FABER: neg B Greater Troch: NT B Log Roll: neg B Stork: NT B Sciatic Notch: NT   HIP EXAM: SIDE: B ROM: Abduction, Flexion, Internal and  External range of motion: full Pain with terminal IROM and EROM: minimal There is notable tenderness in the posterior pelvic rim and in the buttocks muscles on the left. GTB: NT SLR: NEG Knees: No effusion FABER: NT REVERSE FABER: NT, neg Piriformis: NT at direct palpation Str: flexion: 5/5 abduction: 5/5 adduction: 5/5 Strength testing non-tender     Radiology: CLINICAL DATA:  Left lower back pain.   EXAM: LUMBAR SPINE - COMPLETE 4+ VIEW   COMPARISON:  Lumbar spine radiographs performed 08/05/2008   FINDINGS: There is no evidence of fracture or subluxation. Vertebral bodies demonstrate normal height and alignment. Intervertebral disc spaces are preserved. The visualized neural foramina are grossly unremarkable in appearance. An apparent bone  island is again noted at the left ilium.   The visualized bowel gas pattern is unremarkable in appearance; air and stool are noted within the colon. The sacroiliac joints are within normal limits. Clips are noted within the right upper quadrant, reflecting prior cholecystectomy.   IMPRESSION: No evidence of fracture or subluxation along the lumbar spine.     Electronically Signed   By: Garald Balding M.D.   On: 02/09/2014 05:32   Assessment and Plan:   Pelvic sprain and strains, initial encounter  Suspect purely musculature.  Muscle injury.  Continue with basic range of motion, stretching, and gentle strengthening and likely will improve over the next few weeks.  A rehabilitation program from the [employer] was reviewed with the patient face to face for their condition.   Signed,  Maud Deed. Araiyah Cumpton, MD   Patient's Medications  New Prescriptions   No medications on file  Previous Medications   ACETAMINOPHEN (TYLENOL) 500 MG TABLET    Take 1,000 mg by mouth every 6 (six) hours as needed for mild pain.    CALCIUM-MAGNESIUM-VITAMIN D (CALCIUM 1200+D3 PO)    Take 1 tablet by mouth daily.    DULOXETINE (CYMBALTA) 30 MG CAPSULE    TAKE 1 CAPSULE (30 MG TOTAL) BY MOUTH DAILY.   ESOMEPRAZOLE (NEXIUM) 40 MG CAPSULE    Take 1 capsule (40 mg total) by mouth daily at 12 noon.   FEXOFENADINE (ALLEGRA) 60 MG TABLET    Take 180 mg by mouth daily as needed for allergies.    MOMETASONE (NASONEX) 50 MCG/ACT NASAL SPRAY    Place 2 sprays into the nose daily as needed (allergies).   PRAVASTATIN (PRAVACHOL) 20 MG TABLET    Take 1 tablet (20 mg total) by mouth daily.  Modified Medications   No medications on file  Discontinued Medications   AZITHROMYCIN (ZITHROMAX Z-PAK) 250 MG TABLET    Take 2 pills by mouth today and then 1 pill daily for 4 days   HYDROCODONE-HOMATROPINE (HYCODAN) 5-1.5 MG/5ML SYRUP    Take 5 mLs by mouth every 8 (eight) hours as needed for cough.

## 2015-03-05 NOTE — Progress Notes (Signed)
Pre visit review using our clinic review tool, if applicable. No additional management support is needed unless otherwise documented below in the visit note. 

## 2015-04-02 ENCOUNTER — Other Ambulatory Visit: Payer: Self-pay | Admitting: *Deleted

## 2015-04-02 MED ORDER — PRAVASTATIN SODIUM 20 MG PO TABS: 20.0000 mg | ORAL_TABLET | Freq: Every day | ORAL | Status: DC

## 2015-04-19 ENCOUNTER — Telehealth: Payer: Self-pay | Admitting: Family Medicine

## 2015-04-19 DIAGNOSIS — Z0000 Encounter for general adult medical examination without abnormal findings: Secondary | ICD-10-CM | POA: Insufficient documentation

## 2015-04-19 DIAGNOSIS — E785 Hyperlipidemia, unspecified: Secondary | ICD-10-CM

## 2015-04-19 DIAGNOSIS — E559 Vitamin D deficiency, unspecified: Secondary | ICD-10-CM

## 2015-04-19 NOTE — Telephone Encounter (Signed)
-----   Message from Ellamae Sia sent at 04/13/2015  9:46 AM EDT ----- Regarding: Lab orders for Monday, 10.3.16 Patient is scheduled for CPX labs, please order future labs, Thanks , Karna Christmas

## 2015-04-20 ENCOUNTER — Other Ambulatory Visit (INDEPENDENT_AMBULATORY_CARE_PROVIDER_SITE_OTHER): Payer: Medicare Other

## 2015-04-20 DIAGNOSIS — E785 Hyperlipidemia, unspecified: Secondary | ICD-10-CM | POA: Diagnosis not present

## 2015-04-20 DIAGNOSIS — E559 Vitamin D deficiency, unspecified: Secondary | ICD-10-CM

## 2015-04-20 DIAGNOSIS — Z0000 Encounter for general adult medical examination without abnormal findings: Secondary | ICD-10-CM

## 2015-04-20 LAB — LIPID PANEL
CHOLESTEROL: 178 mg/dL (ref 0–200)
HDL: 71.9 mg/dL (ref >39.00)
LDL CALC: 90 mg/dL (ref 0–99)
NonHDL: 105.61
TRIGLYCERIDES: 79 mg/dL (ref 0.0–149.0)
Total CHOL/HDL Ratio: 2
VLDL: 15.8 mg/dL (ref 0.0–40.0)

## 2015-04-20 LAB — COMPREHENSIVE METABOLIC PANEL
ALT: 15 U/L (ref 0–35)
AST: 22 U/L (ref 0–37)
Albumin: 3.9 g/dL (ref 3.5–5.2)
Alkaline Phosphatase: 87 U/L (ref 39–117)
BUN: 12 mg/dL (ref 6–23)
CALCIUM: 9.6 mg/dL (ref 8.4–10.5)
CHLORIDE: 107 meq/L (ref 96–112)
CO2: 26 mEq/L (ref 19–32)
CREATININE: 1.01 mg/dL (ref 0.40–1.20)
GFR: 57.79 mL/min — ABNORMAL LOW (ref >60.00)
Glucose, Bld: 97 mg/dL (ref 70–99)
POTASSIUM: 4.1 meq/L (ref 3.5–5.1)
Sodium: 142 mEq/L (ref 135–145)
TOTAL PROTEIN: 6.6 g/dL (ref 6.0–8.3)
Total Bilirubin: 0.5 mg/dL (ref 0.2–1.2)

## 2015-04-20 LAB — CBC WITH DIFFERENTIAL/PLATELET
BASOS PCT: 0.8 % (ref 0.0–3.0)
Basophils Absolute: 0 10*3/uL (ref 0.0–0.1)
Eosinophils Absolute: 0.4 10*3/uL (ref 0.0–0.7)
Eosinophils Relative: 6.7 % — ABNORMAL HIGH (ref 0.0–5.0)
HEMATOCRIT: 41 % (ref 36.0–46.0)
HEMOGLOBIN: 13.8 g/dL (ref 12.0–15.0)
LYMPHS PCT: 44.1 % (ref 12.0–46.0)
Lymphs Abs: 2.4 10*3/uL (ref 0.7–4.0)
MCHC: 33.6 g/dL (ref 30.0–36.0)
MCV: 93.6 fl (ref 78.0–100.0)
MONOS PCT: 6.7 % (ref 3.0–12.0)
Monocytes Absolute: 0.4 10*3/uL (ref 0.1–1.0)
Neutro Abs: 2.2 10*3/uL (ref 1.4–7.7)
Neutrophils Relative %: 41.7 % — ABNORMAL LOW (ref 43.0–77.0)
Platelets: 290 10*3/uL (ref 150.0–400.0)
RBC: 4.39 Mil/uL (ref 3.87–5.11)
RDW: 13.2 % (ref 11.5–15.5)
WBC: 5.4 10*3/uL (ref 4.0–10.5)

## 2015-04-20 LAB — VITAMIN D 25 HYDROXY (VIT D DEFICIENCY, FRACTURES): VITD: 43.91 ng/mL (ref 30.00–100.00)

## 2015-04-20 LAB — TSH: TSH: 2.6 u[IU]/mL (ref 0.35–4.50)

## 2015-04-27 ENCOUNTER — Encounter: Payer: Self-pay | Admitting: Family Medicine

## 2015-04-27 ENCOUNTER — Ambulatory Visit (INDEPENDENT_AMBULATORY_CARE_PROVIDER_SITE_OTHER): Payer: Medicare Other | Admitting: Family Medicine

## 2015-04-27 VITALS — BP 122/68 | HR 72 | Temp 98.1°F | Ht 62.0 in | Wt 147.0 lb

## 2015-04-27 DIAGNOSIS — Z23 Encounter for immunization: Secondary | ICD-10-CM

## 2015-04-27 DIAGNOSIS — E785 Hyperlipidemia, unspecified: Secondary | ICD-10-CM

## 2015-04-27 DIAGNOSIS — E559 Vitamin D deficiency, unspecified: Secondary | ICD-10-CM

## 2015-04-27 DIAGNOSIS — E2839 Other primary ovarian failure: Secondary | ICD-10-CM

## 2015-04-27 DIAGNOSIS — M8580 Other specified disorders of bone density and structure, unspecified site: Secondary | ICD-10-CM

## 2015-04-27 DIAGNOSIS — Z0000 Encounter for general adult medical examination without abnormal findings: Secondary | ICD-10-CM

## 2015-04-27 NOTE — Progress Notes (Signed)
Pre visit review using our clinic review tool, if applicable. No additional management support is needed unless otherwise documented below in the visit note. 

## 2015-04-27 NOTE — Assessment & Plan Note (Signed)
Improved with pravastatin and diet  Disc goals for lipids and reasons to control them Rev labs with pt Rev low sat fat diet in detail

## 2015-04-27 NOTE — Assessment & Plan Note (Signed)
Reviewed health habits including diet and exercise and skin cancer prevention Reviewed appropriate screening tests for age  Also reviewed health mt list, fam hx and immunization status , as well as social and family history   See HPI Labs rev Stop up front on the way out regarding your bone density test Please get that and your mammogram as planned  Had flu shot today  Check about getting a tetanus shot outside the office (see the letter I gave you)  If you are interested in a shingles/zoster vaccine - call your insurance to check on coverage,( you should not get it within 1 month of other vaccines) , then call us for a prescription  for it to take to a pharmacy that gives the shot , or make a nurse visit to get it here depending on your coverage Please work on an advance directive

## 2015-04-27 NOTE — Progress Notes (Signed)
Subjective:    Patient ID: Diane Delacruz, female    DOB: 1946/03/23, 69 y.o.   MRN: [id number]  HPI Here for annual medicare wellness visit as well as chronic/acute medical problems as well as annual preventative exam  I have personally reviewed the Medicare Annual Wellness questionnaire and have noted 1. The patient's medical and social history 2. Their use of alcohol, tobacco or illicit drugs 3. Their current medications and supplements 4. The patient's functional ability including ADL's, fall risks, home safety risks and hearing or visual             impairment. 5. Diet and physical activities 6. Evidence for depression or mood disorders  The patients weight, height, BMI have been recorded in the chart and visual acuity is per eye clinic.  I have made referrals, counseling and provided education to the patient based review of the above and I have provided the pt with a written personalized care plan for preventive services. Reviewed and updated provider list, see scanned forms.  Has been doing well   See scanned forms.  Routine anticipatory guidance given to patient.  See health maintenance. Colon cancer screening 6/13 - recall 2018  Breast cancer screening 5/13-has not had one since , she has one scheduled for Nov 2nd  Self breast exam- no lumps or changes No gyn problems  Flu vaccine -today  Tetanus vaccine 4/04 - will check to see if it is covered at pharmacy or health dept  Pneumovax- complete both Zoster vaccine -she called to check on coverage (reimbursing?)- unsure if office or pharmacy dexa 5/13- has her appt for Nov -needs a referral however  Advance directive does not have living will or poa (has the booklet to look at)  Cognitive function addressed- see scanned forms- and if abnormal then additional documentation follows. No significant memory problems / occ walks in a room and forgets why   PMH and SH reviewed  Meds, vitals, and allergies reviewed.   ROS: See  HPI.  Otherwise negative.    Wt is down 2 lb with bmi of 26 She tries to take care of herself - she has cut out a lot of sugar and sweets lately  Also drinking more water   Results for orders placed or performed in visit on 04/20/15  CBC with Differential/Platelet  Result Value Ref Range   WBC 5.4 4.0 - 10.5 K/uL   RBC 4.39 3.87 - 5.11 Mil/uL   Hemoglobin 13.8 12.0 - 15.0 g/dL   HCT 41.0 36.0 - 46.0 %   MCV 93.6 78.0 - 100.0 fl   MCHC 33.6 30.0 - 36.0 g/dL   RDW 13.2 11.5 - 15.5 %   Platelets 290.0 150.0 - 400.0 K/uL   Neutrophils Relative % 41.7 (L) 43.0 - 77.0 %   Lymphocytes Relative 44.1 12.0 - 46.0 %   Monocytes Relative 6.7 3.0 - 12.0 %   Eosinophils Relative 6.7 (H) 0.0 - 5.0 %   Basophils Relative 0.8 0.0 - 3.0 %   Neutro Abs 2.2 1.4 - 7.7 K/uL   Lymphs Abs 2.4 0.7 - 4.0 K/uL   Monocytes Absolute 0.4 0.1 - 1.0 K/uL   Eosinophils Absolute 0.4 0.0 - 0.7 K/uL   Basophils Absolute 0.0 0.0 - 0.1 K/uL  Comprehensive metabolic panel  Result Value Ref Range   Sodium 142 135 - 145 mEq/L   Potassium 4.1 3.5 - 5.1 mEq/L   Chloride 107 96 - 112 mEq/L   CO2 26 19 -  32 mEq/L   Glucose, Bld 97 70 - 99 mg/dL   BUN 12 6 - 23 mg/dL   Creatinine, Ser 1.01 0.40 - 1.20 mg/dL   Total Bilirubin 0.5 0.2 - 1.2 mg/dL   Alkaline Phosphatase 87 39 - 117 U/L   AST 22 0 - 37 U/L   ALT 15 0 - 35 U/L   Total Protein 6.6 6.0 - 8.3 g/dL   Albumin 3.9 3.5 - 5.2 g/dL   Calcium 9.6 8.4 - 10.5 mg/dL   GFR 57.79 (L) >60.00 mL/min  Lipid panel  Result Value Ref Range   Cholesterol 178 0 - 200 mg/dL   Triglycerides 79.0 0.0 - 149.0 mg/dL   HDL 71.90 >39.00 mg/dL   VLDL 15.8 0.0 - 40.0 mg/dL   LDL Cholesterol 90 0 - 99 mg/dL   Total CHOL/HDL Ratio 2    NonHDL 105.61   TSH  Result Value Ref Range   TSH 2.60 0.35 - 4.50 uIU/mL  Vit D  25 hydroxy (rtn osteoporosis monitoring)  Result Value Ref Range   VITD 43.91 30.00 - 100.00 ng/mL    Labs look good  BP Readings from Last 3 Encounters:    04/27/15 122/68  03/05/15 90/60  09/02/14 110/72    Patient Active Problem List   Diagnosis Date Noted  . Routine general medical examination at a health care facility 04/19/2015  . Acute bronchitis 09/02/2014  . Osteopenia 04/25/2014  . Shingles 02/17/2014  . Neck pain 10/08/2013  . Encounter for Medicare annual wellness exam 11/07/2012  . TMJ (temporomandibular joint syndrome) 02/24/2012  . Insomnia 02/21/2011  . Family history of colon cancer 11/01/2010  . Hyperlipidemia 11/01/2010  . Vitamin D deficiency 10/10/2008  . IRRITABLE BOWEL SYNDROME 10/15/2007  . VARICOSE VEINS, LOWER EXTREMITIES 08/15/2007  . Disorder of bone and cartilage 05/14/2007  . DEPRESSION 05/11/2007  . ALLERGIC RHINITIS, SEASONAL 05/11/2007  . GERD 05/11/2007  . FIBROCYSTIC BREAST DISEASE 05/11/2007  . POSTMENOPAUSAL STATUS 05/11/2007   Past Medical History  Diagnosis Date  . Depression   . CAD (coronary artery disease)   . Palpitations 03/06/09    event monitor  . Unspecified vitamin D deficiency   . Hypoglycemia, unspecified   . Lumbago   . Irritable bowel syndrome   . Unspecified adverse effect of other drug, medicinal and biological substance(995.29)   . Asymptomatic varicose veins   . Pure hypercholesterolemia   . Diffuse cystic mastopathy   . Allergic rhinitis due to pollen   . Symptomatic menopausal or female climacteric states   . Esophageal reflux   . Dyspnea 2008    negative echo and MV scan  . DDD (degenerative disc disease)     in neck, neurosurgeon Dr. Trenton Gammon  . Stress fracture of foot 03/28/99    left  . Bladder tumor 11/05  . Diverticulosis   . History of gallstones    Past Surgical History  Procedure Laterality Date  . Cholecystectomy      gallstones- ultrasound 10/03  . Bladder surgery      tumor removed   Social History  Substance Use Topics  . Smoking status: Never Smoker   . Smokeless tobacco: Never Used  . Alcohol Use: 0.0 oz/week    0 Standard drinks or  equivalent per week     Comment: Rare-wine   Family History  Problem Relation Age of Onset  . Hyperlipidemia Mother   . Stroke Mother   . Arthritis Mother   . Macular degeneration Mother   .  Irritable bowel syndrome Mother   . Colon cancer Father   . Heart disease Father     MI's  . COPD Sister     heavy smoker  . Colon polyps Maternal Aunt   . Breast cancer Paternal Aunt   . Osteopenia Sister   . Other Daughter     celiac spruce  . Diabetes Brother   . Diabetes Paternal Uncle   . Irritable bowel syndrome Sister   . Kidney disease Cousin   . Rectal cancer Neg Hx   . Stomach cancer Neg Hx    Allergies  Allergen Reactions  . Amoxicillin     Rash and diarrhea   . Codeine     REACTION: unknown reaction  . Flexeril [Cyclobenzaprine]     sedation  . Klonopin [Clonazepam]     Dizziness , fatigue  . Nsaids     REACTION: diarrhea  . Omeprazole     REACTION: did not work for her  . Pseudoephedrine     REACTION: jittery   Current Outpatient Prescriptions on File Prior to Visit  Medication Sig Dispense Refill  . acetaminophen (TYLENOL) 500 MG tablet Take 1,000 mg by mouth every 6 (six) hours as needed for mild pain.     . Calcium-Magnesium-Vitamin D (CALCIUM 1200+D3 PO) Take 1 tablet by mouth daily.    . DULoxetine (CYMBALTA) 30 MG capsule TAKE 1 CAPSULE (30 MG TOTAL) BY MOUTH DAILY. 30 capsule 11  . esomeprazole (NEXIUM) 40 MG capsule Take 1 capsule (40 mg total) by mouth daily at 12 noon. 30 capsule 11  . fexofenadine (ALLEGRA) 60 MG tablet Take 180 mg by mouth daily as needed for allergies.     . mometasone (NASONEX) 50 MCG/ACT nasal spray Place 2 sprays into the nose daily as needed (allergies). 17 g 11  . pravastatin (PRAVACHOL) 20 MG tablet Take 1 tablet (20 mg total) by mouth daily. 90 tablet 1   No current facility-administered medications on file prior to visit.        Review of Systems Review of Systems  Constitutional: Negative for fever, appetite change,  fatigue and unexpected weight change.  Eyes: Negative for pain and visual disturbance.  Respiratory: Negative for cough and shortness of breath.   Cardiovascular: Negative for cp or palpitations    Gastrointestinal: Negative for nausea, diarrhea and constipation.  Genitourinary: Negative for urgency and frequency.  Skin: Negative for pallor or rash   Neurological: Negative for weakness, light-headedness, numbness and headaches.  Hematological: Negative for adenopathy. Does not bruise/bleed easily.  Psychiatric/Behavioral: Negative for dysphoric mood. The patient is not nervous/anxious.         Objective:   Physical Exam  Constitutional: She appears well-developed and well-nourished. No distress.  Well app   HENT:  Head: Normocephalic and atraumatic.  Right Ear: External ear normal.  Left Ear: External ear normal.  Mouth/Throat: Oropharynx is clear and moist.  Eyes: Conjunctivae and EOM are normal. Pupils are equal, round, and reactive to light. No scleral icterus.  Neck: Normal range of motion. Neck supple. No JVD present. Carotid bruit is not present. No thyromegaly present.  Cardiovascular: Normal rate, regular rhythm, normal heart sounds and intact distal pulses.  Exam reveals no gallop.   Pulmonary/Chest: Effort normal and breath sounds normal. No respiratory distress. She has no wheezes. She exhibits no tenderness.  Abdominal: Soft. Bowel sounds are normal. She exhibits no distension, no abdominal bruit and no mass. There is no tenderness.  Genitourinary: No breast swelling,  tenderness, discharge or bleeding.  obese and well appearing   Musculoskeletal: Normal range of motion. She exhibits no edema or tenderness.  Lymphadenopathy:    She has no cervical adenopathy.  Neurological: She is alert. She has normal reflexes. No cranial nerve deficit. She exhibits normal muscle tone. Coordination normal.  Skin: Skin is warm and dry. No rash noted. No erythema. No pallor.  Lentigo and  solar aging noted   Psychiatric: She has a normal mood and affect.          Assessment & Plan:   Problem List Items Addressed This Visit      Musculoskeletal and Integument   Osteopenia    Due for dexa  Will plan that  No falls or fx   Disc need for calcium/ vitamin D/ wt bearing exercise and bone density test every 2 y to monitor Disc safety/ fracture risk in detail    D level is therapeutic         Other   Encounter for Medicare annual wellness exam - Primary    Reviewed health habits including diet and exercise and skin cancer prevention Reviewed appropriate screening tests for age  Also reviewed health mt list, fam hx and immunization status , as well as social and family history   See HPI Labs rev Stop up front on the way out regarding your bone density test Please get that and your mammogram as planned  Had flu shot today  Check about getting a tetanus shot outside the office (see the letter I gave you)  If you are interested in a shingles/zoster vaccine - call your insurance to check on coverage,( you should not get it within 1 month of other vaccines) , then call us for a prescription  for it to take to a pharmacy that gives the shot , or make a nurse visit to get it here depending on your coverage Please work on an advance directive       Estrogen deficiency   Relevant Orders   DG Bone Density   Hyperlipidemia    Improved with pravastatin and diet  Disc goals for lipids and reasons to control them Rev labs with pt Rev low sat fat diet in detail       Routine general medical examination at a health care facility    Reviewed health habits including diet and exercise and skin cancer prevention Reviewed appropriate screening tests for age  Also reviewed health mt list, fam hx and immunization status , as well as social and family history   See HPI Labs rev Stop up front on the way out regarding your bone density test Please get that and your mammogram as  planned  Had flu shot today  Check about getting a tetanus shot outside the office (see the letter I gave you)  If you are interested in a shingles/zoster vaccine - call your insurance to check on coverage,( you should not get it within 1 month of other vaccines) , then call us for a prescription  for it to take to a pharmacy that gives the shot , or make a nurse visit to get it here depending on your coverage Please work on an advance directive       Vitamin D deficiency    Vitamin D level is therapeutic with current supplementation Disc importance of this to bone and overall health        Other Visit Diagnoses    Need for influenza vaccination  Relevant Orders    Flu Vaccine QUAD 36+ mos PF IM (Fluarix & Fluzone Quad PF) (Completed)

## 2015-04-27 NOTE — Patient Instructions (Signed)
Stop up front on the way out regarding your bone density test Please get that and your mammogram as planned  Had flu shot today  Check about getting a tetanus shot outside the office (see the letter I gave you)  If you are interested in a shingles/zoster vaccine - call your insurance to check on coverage,( you should not get it within 1 month of other vaccines) , then call us for a prescription  for it to take to a pharmacy that gives the shot , or make a nurse visit to get it here depending on your coverage Please work on an advance directive

## 2015-04-27 NOTE — Assessment & Plan Note (Signed)
Due for dexa  Will plan that  No falls or fx   Disc need for calcium/ vitamin D/ wt bearing exercise and bone density test every 2 y to monitor Disc safety/ fracture risk in detail    D level is therapeutic

## 2015-04-27 NOTE — Assessment & Plan Note (Signed)
Vitamin D level is therapeutic with current supplementation Disc importance of this to bone and overall health  

## 2015-04-29 ENCOUNTER — Other Ambulatory Visit: Payer: Self-pay | Admitting: Family Medicine

## 2015-04-29 NOTE — Telephone Encounter (Signed)
Please refill for a year  

## 2015-04-29 NOTE — Telephone Encounter (Signed)
done

## 2015-04-29 NOTE — Telephone Encounter (Signed)
Last refilled on 04/25/14 #30 with 11 additional refills, pt had CPE scheduled on 04/27/15, please advise

## 2015-05-20 ENCOUNTER — Ambulatory Visit
Admission: RE | Admit: 2015-05-20 | Discharge: 2015-05-20 | Disposition: A | Discharge status: Home / Self Care | Payer: Medicare Other | Source: Ambulatory Visit | Attending: Family Medicine | Admitting: Family Medicine

## 2015-05-20 ENCOUNTER — Ambulatory Visit
Admission: RE | Admit: 2015-05-20 | Discharge: 2015-05-20 | Disposition: A | Discharge status: Home / Self Care | Payer: Medicare Other | Source: Ambulatory Visit

## 2015-05-20 DIAGNOSIS — M8580 Other specified disorders of bone density and structure, unspecified site: Secondary | ICD-10-CM

## 2015-05-20 DIAGNOSIS — Z1231 Encounter for screening mammogram for malignant neoplasm of breast: Secondary | ICD-10-CM

## 2015-06-01 ENCOUNTER — Encounter: Payer: Self-pay | Admitting: Cardiology

## 2015-07-22 ENCOUNTER — Encounter: Payer: Self-pay | Admitting: Family Medicine

## 2015-07-22 ENCOUNTER — Ambulatory Visit
Admission: RE | Admit: 2015-07-22 | Discharge: 2015-07-22 | Disposition: A | Discharge status: Home / Self Care | Payer: Medicare Other | Source: Ambulatory Visit | Attending: Family Medicine | Admitting: Family Medicine

## 2015-07-22 ENCOUNTER — Telehealth: Payer: Self-pay | Admitting: *Deleted

## 2015-07-22 ENCOUNTER — Ambulatory Visit (INDEPENDENT_AMBULATORY_CARE_PROVIDER_SITE_OTHER): Payer: Medicare Other | Admitting: Family Medicine

## 2015-07-22 VITALS — BP 118/76 | HR 71 | Temp 97.8°F | Ht 62.0 in | Wt 151.2 lb

## 2015-07-22 DIAGNOSIS — H538 Other visual disturbances: Secondary | ICD-10-CM | POA: Diagnosis not present

## 2015-07-22 DIAGNOSIS — R51 Headache: Secondary | ICD-10-CM | POA: Diagnosis not present

## 2015-07-22 DIAGNOSIS — R930 Abnormal findings on diagnostic imaging of skull and head, not elsewhere classified: Secondary | ICD-10-CM | POA: Insufficient documentation

## 2015-07-22 DIAGNOSIS — R519 Headache, unspecified: Secondary | ICD-10-CM

## 2015-07-22 DIAGNOSIS — R9089 Other abnormal findings on diagnostic imaging of central nervous system: Secondary | ICD-10-CM

## 2015-07-22 NOTE — Progress Notes (Signed)
Patient ID: Sharyon Medicus, female   DOB: 15-May-1946, 70 y.o.   MRN: [id number]  Tommi Rumps, MD Phone: [phone]  JERMANY WAN is a 70 y.o. female who presents today for same-day visit.  Headache: Patient notes over the last week she has had a left occipital headache intermittently. Notes it can be sharp in nature. Notes it is gradual in onset. She notes it includes the left occiput area. Does not radiate anywhere. She does not have any neck pain with this. She notes maybe she has to squint a little bit at the start of the headache, though no other vision issues. No numbness or weakness. She does get some photophobia and some phonophobia with this. It does not wake her up from sleep. It is not sudden onset. It is not the worst headache she is ever had before. She does have a history of headaches in the past, though this is different and new. Sleep does not help it. She is unable to say how long it lasts for, though it will go away on its own. She does take Tylenol with some modest benefit.  PMH: nonsmoker.   ROS see history of present illness  Objective  Physical Exam Filed Vitals:   07/22/15 1009  BP: 118/76  Pulse: 71  Temp: 97.8 F (36.6 C)   Physical Exam  Constitutional: She is well-developed, well-nourished, and in no distress.  HENT:  Head: Normocephalic and atraumatic.  Right Ear: External ear normal.  Left Ear: External ear normal.  Mouth/Throat: Oropharynx is clear and moist. No oropharyngeal exudate.  Eyes: Conjunctivae are normal. Pupils are equal, round, and reactive to light.  Neck: Normal range of motion. Neck supple.  No midline neck tenderness, no muscular neck tenderness  Cardiovascular: Normal rate, regular rhythm and normal heart sounds.  Exam reveals no gallop and no friction rub.   No murmur heard. Pulmonary/Chest: Effort normal and breath sounds normal. No respiratory distress. She has no wheezes. She has no rales.  Lymphadenopathy:    She has no  cervical adenopathy.  Neurological: She is alert.  CN 2-12 intact, 5/5 strength in bilateral biceps, triceps, grip, quads, hamstrings, plantar and dorsiflexion, sensation to light touch intact in bilateral UE and LE, normal gait, 2+ patellar reflexes, normal finger to nose, negative Romberg, no pronator drift  Skin: Skin is warm and dry. She is not diaphoretic.     Assessment/Plan: Please see individual problem list.  New onset of headaches after age 20 Patient presents with intermittent headache for the past week. It is not sudden onset and is not worst headache she's ever had thus I doubt subarachnoid hemorrhages cause. It is not consistent with migraine. Is not consistent with tension headache. Could be sharp stabbing-type headache. Doubt trigeminal neuralgia given location. Given her age and that this is different than her prior headaches and more persistent than her prior headaches she warrants evaluation with MRI. This will be ordered to be obtained within the next several days. She is neurologically intact at this time. She has no headache at this time. If the MRI is negative would consider given location of this is from her neck and would consider treatment based on that. Can continue as needed Tylenol. She is given return precautions.    Orders Placed This Encounter  Procedures  . MR Brain Wo Contrast    Standing Status: Future     Number of Occurrences:      Standing Expiration Date: 09/18/2016    Order  Specific Question:  Reason for Exam (SYMPTOM  OR DIAGNOSIS REQUIRED)    Answer:  new onset headache in 70 yo, mild blurry vision at start of headache    Order Specific Question:  Preferred imaging location?    Answer:  [facility]    Order Specific Question:  Does the patient have a pacemaker or implanted devices?    Answer:  Yes    Order Specific Question:  Manufacturer of pacemake or implanted device?    Answer:  has dental implant, no pacemaker    Order Specific Question:   What is the patient's sedation requirement?    Answer:  No Sedation    Dragon voice recognition software was used during the dictation process of this note. If any phrases or words seem inappropriate it is likely secondary to the translation process being inefficient.  Tommi Rumps

## 2015-07-22 NOTE — Telephone Encounter (Signed)
Patient was seen by Dr. Caryl Bis today , and stated that she was left a message on her voicemail about scheduling a MRI. Patient requested a call back in reference to the MRI.

## 2015-07-22 NOTE — Progress Notes (Signed)
Pre visit review using our clinic review tool, if applicable. No additional management support is needed unless otherwise documented below in the visit note. 

## 2015-07-22 NOTE — Telephone Encounter (Signed)
Spoke with patient and informed her of the MRI findings of nonspecific T2 hyperintensities. Advised that given this finding and her recent symptoms an evaluation by neurology would be beneficial. Will refer to neurology in [place].

## 2015-07-22 NOTE — Patient Instructions (Signed)
Nice to meet you. We will obtain an MRI to evaluate her headaches further. Please continue the Tylenol as needed for headaches. If you develop persistent headache, numbness, weakness, vision changes, dizziness, or any new or change in symptoms please seek medical attention.

## 2015-07-22 NOTE — Telephone Encounter (Signed)
Did one of you call her today, I see that the orders are in, thanks.

## 2015-07-22 NOTE — Assessment & Plan Note (Signed)
Patient presents with intermittent headache for the past week. It is not sudden onset and is not worst headache she's ever had thus I doubt subarachnoid hemorrhages cause. It is not consistent with migraine. Is not consistent with tension headache. Could be sharp stabbing-type headache. Doubt trigeminal neuralgia given location. Given her age and that this is different than her prior headaches and more persistent than her prior headaches she warrants evaluation with MRI. This will be ordered to be obtained within the next several days. She is neurologically intact at this time. She has no headache at this time. If the MRI is negative would consider given location of this is from her neck and would consider treatment based on that. Can continue as needed Tylenol. She is given return precautions.

## 2015-07-29 ENCOUNTER — Ambulatory Visit: Payer: Medicare Other

## 2015-08-10 ENCOUNTER — Encounter: Payer: Self-pay | Admitting: Primary Care

## 2015-08-10 ENCOUNTER — Ambulatory Visit (INDEPENDENT_AMBULATORY_CARE_PROVIDER_SITE_OTHER): Payer: Medicare Other | Admitting: Primary Care

## 2015-08-10 VITALS — BP 116/72 | HR 72 | Temp 97.8°F | Ht 62.0 in | Wt 151.8 lb

## 2015-08-10 DIAGNOSIS — R350 Frequency of micturition: Secondary | ICD-10-CM | POA: Diagnosis not present

## 2015-08-10 LAB — POC URINALSYSI DIPSTICK (AUTOMATED)
Bilirubin, UA: NEGATIVE
Glucose, UA: NEGATIVE
Ketones, UA: NEGATIVE
LEUKOCYTES UA: NEGATIVE
NITRITE UA: NEGATIVE
PH UA: 6
Spec Grav, UA: >=1.03
UROBILINOGEN UA: NEGATIVE

## 2015-08-10 NOTE — Progress Notes (Signed)
Subjective:    Patient ID: Diane Delacruz, female    DOB: 04/14/1946, 70 y.o.   MRN: [id number]  HPI  Ms. Bleazard is a 70 year old female who presents today with a chief complaint of abdominal pain. She also reports nausea, bloating, dysuria, and urinary frequency. She's had once episode of vomiting this morning. Her pain was initially located to the epigastric region and has since become more generalized. Her pain was worse with sitting or laying on her side; improved with laying flat. She first noticed her pain at 1-2 am this morning. It did not wake her from sleep as she was already awake.   She is currently managed on Nexium 40 mg daily for acid reflux. She's been able to tolerate fluid intake. She's had multiple soft stools since 1 am. Denies fevers, diarrhea, hematuria, vaginal discharge. She's also experienced urinary frequency, dysuria that has been present for the past week. She's not taken anything OTC for her pain/symptoms.   Review of Systems  Constitutional: Negative for fever, chills and fatigue.  Gastrointestinal: Positive for nausea, vomiting, abdominal pain and abdominal distention. Negative for diarrhea and constipation.  Genitourinary: Positive for dysuria and frequency. Negative for flank pain and vaginal discharge.       Past Medical History  Diagnosis Date  . Depression   . CAD (coronary artery disease)   . Palpitations 03/06/09    event monitor  . Unspecified vitamin D deficiency   . Hypoglycemia, unspecified   . Lumbago   . Irritable bowel syndrome   . Unspecified adverse effect of other drug, medicinal and biological substance(995.29)   . Asymptomatic varicose veins   . Pure hypercholesterolemia   . Diffuse cystic mastopathy   . Allergic rhinitis due to pollen   . Symptomatic menopausal or female climacteric states   . Esophageal reflux   . Dyspnea 2008    negative echo and MV scan  . DDD (degenerative disc disease)     in neck, neurosurgeon Dr. Trenton Gammon    . Stress fracture of foot 03/28/99    left  . Bladder tumor 11/05  . Diverticulosis   . History of gallstones     Social History   Social History  . Marital Status: Married    Spouse Name: N/A  . Number of Children: 1  . Years of Education: N/A   Occupational History  . retired   .     Social History Main Topics  . Smoking status: Never Smoker   . Smokeless tobacco: Never Used  . Alcohol Use: 0.0 oz/week    0 Standard drinks or equivalent per week     Comment: Rare-wine  . Drug Use: No  . Sexual Activity: Not on file   Other Topics Concern  . 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 Cholecystectomy      gallstones- ultrasound 10/03  . Bladder surgery      tumor removed    Family History  Problem Relation Age of Onset  . Hyperlipidemia Mother   . Stroke Mother   . Arthritis Mother   . Macular degeneration Mother   . Irritable bowel syndrome Mother   . Colon cancer Father   . Heart disease Father     MI's  . COPD Sister     heavy smoker  .  Colon polyps Maternal Aunt   . Breast cancer Paternal Aunt   . Osteopenia Sister   . Other Daughter     celiac spruce  . Diabetes Brother   . Diabetes Paternal Uncle   . Irritable bowel syndrome Sister   . Kidney disease Cousin   . Rectal cancer Neg Hx   . Stomach cancer Neg Hx     Allergies  Allergen Reactions  . Amoxicillin     Rash and diarrhea   . Codeine     REACTION: unknown reaction  . Flexeril [Cyclobenzaprine]     sedation  . Klonopin [Clonazepam]     Dizziness , fatigue  . Nsaids     REACTION: diarrhea  . Omeprazole     REACTION: did not work for her  . Pseudoephedrine     REACTION: jittery    Current Outpatient Prescriptions on File Prior to Visit  Medication Sig Dispense Refill  . acetaminophen (TYLENOL) 500 MG tablet Take 1,000 mg by mouth every 6 (six)  hours as needed for mild pain.     . Calcium-Magnesium-Vitamin D (CALCIUM 1200+D3 PO) Take 1 tablet by mouth daily.    Marland Kitchen esomeprazole (NEXIUM) 40 MG capsule Take 1 capsule (40 mg total) by mouth daily at 12 noon. 30 capsule 11  . fexofenadine (ALLEGRA) 60 MG tablet Take 180 mg by mouth daily as needed for allergies.     . mometasone (NASONEX) 50 MCG/ACT nasal spray Place 2 sprays into the nose daily as needed (allergies). 17 g 11  . pravastatin (PRAVACHOL) 20 MG tablet Take 1 tablet (20 mg total) by mouth daily. 90 tablet 1   No current facility-administered medications on file prior to visit.    BP 116/72 mmHg  Pulse 72  Temp(Src) 97.8 F (36.6 C) (Oral)  Ht 5\' 2"  (1.575 m)  Wt 151 lb 12.8 oz (68.856 kg)  BMI 27.76 kg/m2  SpO2 97%  LMP 07/19/1995    Objective:   Physical Exam  Constitutional: She appears well-nourished.  Cardiovascular: Normal rate and regular rhythm.   Pulmonary/Chest: Effort normal and breath sounds normal.  Abdominal: Soft. Normal appearance and bowel sounds are normal. There is tenderness in the right lower quadrant, epigastric area, periumbilical area and suprapubic area. There is no CVA tenderness.  Skin: Skin is warm and dry.          Assessment & Plan:  Abdominal Pain:  Located initially to epigastric region, now more generalized. Also with soft stools and 1 episode of vomiting. Exam with tenderness to epigastric, periumbilical and RLQ. Gall bladder removed years ago, do not suspect acute appendicitis. Vitals normal. No evidence of dehydration. Likely viral infection and will treat with supportive measures as she's feeling slightly improved now. UA: 2 + blood (history of). Negative for leuks and nitrites.  Culture sent for further evaluation. Pepto bismol, fluids, advance diet as tolerated. Return precautions provided.

## 2015-08-10 NOTE — Patient Instructions (Signed)
Your urine dose not show any evidence for infection, however, I will send off for further evaluation.  Your symptoms are likely related to a viral infection which should pass on it's own. Slowly increase your diet as tolerated.   You may take pepto bismol for nausea, stomach discomfort, soft stools. Caution as this may turn your stool dark brown/black.  Please call me if you develop worse pain, diarrhea, vomiting, fevers.  It was a pleasure meeting you! I'll be in touch with you soon.

## 2015-08-10 NOTE — Progress Notes (Signed)
Pre visit review using our clinic review tool, if applicable. No additional management support is needed unless otherwise documented below in the visit note. 

## 2015-08-11 LAB — URINE CULTURE
COLONY COUNT: NO GROWTH
Organism ID, Bacteria: NO GROWTH

## 2015-08-18 ENCOUNTER — Ambulatory Visit (INDEPENDENT_AMBULATORY_CARE_PROVIDER_SITE_OTHER): Payer: Medicare Other | Admitting: Neurology

## 2015-08-18 ENCOUNTER — Encounter: Payer: Self-pay | Admitting: Neurology

## 2015-08-18 VITALS — BP 110/60 | HR 67 | Ht 63.0 in | Wt 150.0 lb

## 2015-08-18 DIAGNOSIS — R930 Abnormal findings on diagnostic imaging of skull and head, not elsewhere classified: Secondary | ICD-10-CM

## 2015-08-18 DIAGNOSIS — R9082 White matter disease, unspecified: Secondary | ICD-10-CM

## 2015-08-18 DIAGNOSIS — M5090 Cervical disc disorder, unspecified, unspecified cervical region: Secondary | ICD-10-CM | POA: Diagnosis not present

## 2015-08-18 DIAGNOSIS — G4486 Cervicogenic headache: Secondary | ICD-10-CM

## 2015-08-18 DIAGNOSIS — R51 Headache: Secondary | ICD-10-CM

## 2015-08-18 NOTE — Progress Notes (Signed)
NEUROLOGY CONSULTATION NOTE  Diane Delacruz MRN: [id number] DOB: 1945/09/19  Referring provider: Dr. Glori Bickers Primary care provider: Dr. Glori Bickers  Reason for consult:  Headache, abnormal MRI of brain  HISTORY OF PRESENT ILLNESS: Diane Delacruz is a 70 year old right-handed female who presents for headache and abnormal MRI of brain.  History obtained by patient and PCP note.  Imaging of brain and cervical MRI reviewed.  For a while, she has had headaches off and on.  They are occipital on the left.  They are an aching pain.  There is no stabbing, burning or electric quality.  There may be some associated photophobia and phonophobia, but denies nausea or visual disturbance.  Usually, it would last a day and respond to Tylenol (she cannot take NSAIDs due to reflux).  On 07/13/15, she developed similar headache, but it persisted for about a week.  MRI of brain without contrast from 07/22/15 showed nonspecific subcortical T2 hyperintensities.   She has longstanding history of neck issues.  She feels that she has decreased range of motion when turning her head to the left.  Turning her head to the left may exacerbate head discomfort.  She denies radicular pain or numbness down the left arm.  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  She has remote history of migraines.    PAST MEDICAL HISTORY: Past Medical History  Diagnosis Date  . Depression   . CAD (coronary artery disease)   . Palpitations 03/06/09    event monitor  . Unspecified vitamin D deficiency   . Hypoglycemia, unspecified   . Lumbago   . Irritable bowel syndrome   . Unspecified adverse effect of other drug, medicinal and biological substance(995.29)   . Asymptomatic varicose veins   . Pure hypercholesterolemia   . Diffuse cystic mastopathy   . Allergic rhinitis due to pollen   . Symptomatic  menopausal or female climacteric states   . Esophageal reflux   . Dyspnea 2008    negative echo and MV scan  . DDD (degenerative disc disease)     in neck, neurosurgeon Dr. Trenton Gammon  . Stress fracture of foot 03/28/99    left  . Bladder tumor 11/05  . Diverticulosis   . History of gallstones     PAST SURGICAL HISTORY: Past Surgical History  Procedure Laterality Date  . Cholecystectomy      gallstones- ultrasound 10/03  . Bladder surgery      tumor removed    MEDICATIONS: Current Outpatient Prescriptions on File Prior to Visit  Medication Sig Dispense Refill  . acetaminophen (TYLENOL) 500 MG tablet Take 1,000 mg by mouth every 6 (six) hours as needed for mild pain.     . Calcium-Magnesium-Vitamin D (CALCIUM 1200+D3 PO) Take 1 tablet by mouth daily.    Marland Kitchen esomeprazole (NEXIUM) 40 MG capsule Take 1 capsule (40 mg total) by mouth daily at 12 noon. 30 capsule 11  . fexofenadine (ALLEGRA) 60 MG tablet Take 180 mg by mouth daily as needed for allergies.     . mometasone (NASONEX) 50 MCG/ACT nasal spray Place 2 sprays into the nose daily as needed (allergies). 17 g 11  . pravastatin (PRAVACHOL) 20 MG tablet Take 1 tablet (20 mg total) by mouth daily. 90 tablet 1   No current facility-administered medications on file prior to visit.    ALLERGIES: Allergies  Allergen Reactions  . Amoxicillin     Rash and diarrhea   . Codeine     REACTION: unknown reaction  . Flexeril [Cyclobenzaprine]     sedation  . Klonopin [Clonazepam]     Dizziness , fatigue  . Nsaids     REACTION: diarrhea  . Omeprazole     REACTION: did not work for her  . Pseudoephedrine     REACTION: jittery    FAMILY HISTORY: Family History  Problem Relation Age of Onset  . Hyperlipidemia Mother   . Stroke Mother   . Arthritis Mother   . Macular degeneration Mother   . Irritable bowel syndrome Mother   . Colon cancer Father   . Heart disease Father     MI's  . COPD Sister     heavy smoker  . Colon polyps  Maternal Aunt   . Breast cancer Paternal Aunt   . Osteopenia Sister   . Other Daughter     celiac spruce  . Diabetes Brother   . Diabetes Paternal Uncle   . Irritable bowel syndrome Sister   . Kidney disease Cousin   . Rectal cancer Neg Hx   . Stomach cancer Neg Hx     SOCIAL HISTORY: Social History   Social History  . Marital Status: Married    Spouse Name: N/A  . Number of Children: 1  . Years of Education: N/A   Occupational History  . retired   .     Social History Main Topics  . Smoking status: Never Smoker   . Smokeless tobacco: Never Used  . Alcohol Use: 0.0 oz/week    0 Standard drinks or equivalent per week     Comment: Rare-wine  . Drug Use: No  . Sexual Activity: Not on file   Other Topics Concern  . Not on file   Social History Narrative   Took care 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   Regular exercise    REVIEW OF SYSTEMS: Constitutional: No fevers, chills, or sweats, no generalized fatigue, change in appetite Eyes: No visual changes, double vision, eye pain Ear, nose and throat: No hearing loss, ear pain, nasal congestion, sore throat Cardiovascular: No chest pain, palpitations Respiratory:  No shortness of breath at rest or with exertion, wheezes GastrointestinaI: No nausea, vomiting, diarrhea, abdominal pain, fecal incontinence Genitourinary:  No dysuria, urinary retention or frequency Musculoskeletal:  No neck pain, back pain Integumentary: No rash, pruritus, skin lesions Neurological: as above Psychiatric: No depression, insomnia, anxiety Endocrine: No palpitations, fatigue, diaphoresis, mood swings, change in appetite, change in weight, increased thirst Hematologic/Lymphatic:  No anemia, purpura, petechiae. Allergic/Immunologic: no itchy/runny eyes, nasal congestion, recent allergic reactions, rashes  PHYSICAL EXAM: Filed Vitals:   08/18/15 0754  BP: 110/60  Pulse: 67   General: No acute distress.  Patient appears  well-groomed.  Head:  Normocephalic/atraumatic Eyes:  fundi unremarkable, without vessel changes, exudates, hemorrhages or papilledema. Neck: supple, no paraspinal tenderness, full range of motion Back: No paraspinal tenderness Heart: regular rate and rhythm Lungs: Clear to auscultation bilaterally. Vascular: No carotid bruits. Neurological Exam: Mental status: alert and oriented to person, place, and time, recent and remote memory intact, fund of knowledge intact, attention and concentration intact, speech fluent and not dysarthric, language intact. Cranial nerves: CN I: not tested CN II: pupils equal, round and reactive to light, visual fields intact, fundi unremarkable, without vessel changes, exudates, hemorrhages or papilledema. CN III, IV, VI:  full range of motion,  no nystagmus, no ptosis CN V: facial sensation intact CN VII: upper and lower face symmetric CN VIII: hearing intact CN IX, X: gag intact, uvula midline CN XI: sternocleidomastoid and trapezius muscles intact CN XII: tongue midline Bulk & Tone: normal, no fasciculations. Motor:  5/5 throughout Sensation: temperature and vibration sensation intact. Deep Tendon Reflexes:  2+ throughout, toes downgoing.  Finger to nose testing:  Without dysmetria.  Heel to shin:  Without dysmetria.  Gait:  Normal station and stride.  Able to turn and tandem walk. Romberg with mild sway.  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  They are of uncertain clinical significance, which could be related to age or history of migraines  PLAN: 1.  Recommend treatment of headache and neck pain with analgesics (Tylenol) and/or muscle relaxants 2.  Also consider PT for the neck 3.  If above therapies ineffective and headaches get worse, she may follow up to pursue different treatment modalities  Thank you for allowing me to take part in the care of this patient.  Metta Clines, DO  CC:  Loura Pardon, MD

## 2015-08-18 NOTE — Patient Instructions (Signed)
MRI findings are unremarkable.  They may be related to age and history of migraines but does not seem to be anything serious Headaches likely from the neck.  I would pursue: 1.  Medication management with tylenol and muscle relaxants 2.  Physical therapy of the neck  If these treatment modalities are ineffective and headaches are worse, please call us for further evaluation

## 2016-03-17 ENCOUNTER — Other Ambulatory Visit: Payer: Self-pay | Admitting: Family Medicine

## 2016-03-17 NOTE — Telephone Encounter (Signed)
Please schedule for a winter PE with lab prior and refill until then

## 2016-03-17 NOTE — Telephone Encounter (Signed)
No recent/future appts and no recent labs, please advise

## 2016-03-18 NOTE — Telephone Encounter (Signed)
appt scheduled and med refilled 

## 2016-04-05 DIAGNOSIS — G43109 Migraine with aura, not intractable, without status migrainosus: Secondary | ICD-10-CM | POA: Diagnosis not present

## 2016-04-05 DIAGNOSIS — H2513 Age-related nuclear cataract, bilateral: Secondary | ICD-10-CM | POA: Diagnosis not present

## 2016-05-15 ENCOUNTER — Telehealth: Payer: Self-pay | Admitting: Family Medicine

## 2016-05-15 DIAGNOSIS — E559 Vitamin D deficiency, unspecified: Secondary | ICD-10-CM

## 2016-05-15 DIAGNOSIS — Z0000 Encounter for general adult medical examination without abnormal findings: Secondary | ICD-10-CM

## 2016-05-15 NOTE — Telephone Encounter (Signed)
-----   Message from Marchia Bond sent at 05/13/2016 11:42 AM EDT ----- Regarding: Cpx labs Fri 11/3, need orders. Thanks! :-) Please order  future cpx labs for pt's upcoming lab appt. Thanks Aniceto Boss

## 2016-05-20 ENCOUNTER — Other Ambulatory Visit (INDEPENDENT_AMBULATORY_CARE_PROVIDER_SITE_OTHER): Payer: Medicare Other

## 2016-05-20 DIAGNOSIS — Z0000 Encounter for general adult medical examination without abnormal findings: Secondary | ICD-10-CM

## 2016-05-20 DIAGNOSIS — E559 Vitamin D deficiency, unspecified: Secondary | ICD-10-CM

## 2016-05-20 LAB — CBC WITH DIFFERENTIAL/PLATELET
BASOS ABS: 0 10*3/uL (ref 0.0–0.1)
Basophils Relative: 0.8 % (ref 0.0–3.0)
Eosinophils Absolute: 0.4 10*3/uL (ref 0.0–0.7)
Eosinophils Relative: 7.1 % — ABNORMAL HIGH (ref 0.0–5.0)
HCT: 38.7 % (ref 36.0–46.0)
Hemoglobin: 13.1 g/dL (ref 12.0–15.0)
LYMPHS ABS: 2.5 10*3/uL (ref 0.7–4.0)
Lymphocytes Relative: 42.6 % (ref 12.0–46.0)
MCHC: 33.8 g/dL (ref 30.0–36.0)
MCV: 91.8 fl (ref 78.0–100.0)
MONO ABS: 0.4 10*3/uL (ref 0.1–1.0)
Monocytes Relative: 7.4 % (ref 3.0–12.0)
NEUTROS ABS: 2.4 10*3/uL (ref 1.4–7.7)
NEUTROS PCT: 42.1 % — AB (ref 43.0–77.0)
PLATELETS: 291 10*3/uL (ref 150.0–400.0)
RBC: 4.21 Mil/uL (ref 3.87–5.11)
RDW: 12.6 % (ref 11.5–15.5)
WBC: 5.8 10*3/uL (ref 4.0–10.5)

## 2016-05-20 LAB — LIPID PANEL
CHOLESTEROL: 169 mg/dL (ref 0–200)
HDL: 73.4 mg/dL (ref >39.00)
LDL Cholesterol: 78 mg/dL (ref 0–99)
NONHDL: 95.5
Total CHOL/HDL Ratio: 2
Triglycerides: 86 mg/dL (ref 0.0–149.0)
VLDL: 17.2 mg/dL (ref 0.0–40.0)

## 2016-05-20 LAB — COMPREHENSIVE METABOLIC PANEL
ALK PHOS: 78 U/L (ref 39–117)
ALT: 11 U/L (ref 0–35)
AST: 16 U/L (ref 0–37)
Albumin: 3.8 g/dL (ref 3.5–5.2)
BILIRUBIN TOTAL: 0.4 mg/dL (ref 0.2–1.2)
BUN: 14 mg/dL (ref 6–23)
CO2: 29 meq/L (ref 19–32)
Calcium: 9.9 mg/dL (ref 8.4–10.5)
Chloride: 109 mEq/L (ref 96–112)
Creatinine, Ser: 0.96 mg/dL (ref 0.40–1.20)
GFR: 61.08 mL/min (ref >60.00)
GLUCOSE: 99 mg/dL (ref 70–99)
Potassium: 5.3 mEq/L — ABNORMAL HIGH (ref 3.5–5.1)
SODIUM: 142 meq/L (ref 135–145)
TOTAL PROTEIN: 6.3 g/dL (ref 6.0–8.3)

## 2016-05-20 LAB — VITAMIN D 25 HYDROXY (VIT D DEFICIENCY, FRACTURES): VITD: 39.78 ng/mL (ref 30.00–100.00)

## 2016-05-20 LAB — TSH: TSH: 3.28 u[IU]/mL (ref 0.35–4.50)

## 2016-05-24 ENCOUNTER — Ambulatory Visit (INDEPENDENT_AMBULATORY_CARE_PROVIDER_SITE_OTHER): Payer: Medicare Other | Admitting: Family Medicine

## 2016-05-24 ENCOUNTER — Encounter: Payer: Self-pay | Admitting: Family Medicine

## 2016-05-24 VITALS — BP 110/58 | HR 77 | Temp 97.8°F | Ht 62.0 in | Wt 144.2 lb

## 2016-05-24 DIAGNOSIS — E7800 Pure hypercholesterolemia, unspecified: Secondary | ICD-10-CM | POA: Diagnosis not present

## 2016-05-24 DIAGNOSIS — M8589 Other specified disorders of bone density and structure, multiple sites: Secondary | ICD-10-CM

## 2016-05-24 DIAGNOSIS — Z1159 Encounter for screening for other viral diseases: Secondary | ICD-10-CM | POA: Insufficient documentation

## 2016-05-24 DIAGNOSIS — Z0000 Encounter for general adult medical examination without abnormal findings: Secondary | ICD-10-CM | POA: Diagnosis not present

## 2016-05-24 DIAGNOSIS — E559 Vitamin D deficiency, unspecified: Secondary | ICD-10-CM

## 2016-05-24 MED ORDER — PRAVASTATIN SODIUM 20 MG PO TABS: 20.0000 mg | ORAL_TABLET | Freq: Every day | ORAL | 3 refills | Status: DC

## 2016-05-24 NOTE — Assessment & Plan Note (Signed)
Disc goals for lipids and reasons to control them Rev labs with pt Rev low sat fat diet in detail  Very well controlled with pravastatin and diet

## 2016-05-24 NOTE — Progress Notes (Signed)
Subjective:    Patient ID: Diane Delacruz, female    DOB: [birth year], 70 y.o.   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  5 y recall  Father had colon cancer  Mammogram 11/16-normal - she will call to schedule  Self breast exam-no lumps   utd on other vaccines   dexa 11/16 osteopenia She takes her ca and D [facility] and some treadmill for exercise  No falls  No fractures     D level is 39.7 She is taking 1000 iu D with calcium   Wt Readings from Last 3 Encounters:  05/24/16 144 lb 4 oz (65.4 kg)  08/18/15 150 lb (68 kg)  08/10/15 151 lb 12.8 oz (68.9 kg)   wt is coming down - she is exercising more with a new dog  bmi is 26.3  Pap 4/13-normal  Hx of hyperlipidemia Lab Results  Component Value Date   CHOL 169 05/20/2016   CHOL 178 04/20/2015   CHOL 196 04/17/2014   Lab Results  Component Value Date   HDL 73.40 05/20/2016   HDL 71.90 04/20/2015   HDL 84.40 04/17/2014   Lab Results  Component Value Date   LDLCALC 78 05/20/2016   LDLCALC 90 04/20/2015   LDLCALC 96 04/17/2014   Lab Results  Component Value Date   TRIG 86.0 05/20/2016   TRIG 79.0 04/20/2015   TRIG 80.0 04/17/2014   Lab Results  Component Value Date   CHOLHDL 2 05/20/2016   CHOLHDL 2 04/20/2015   CHOLHDL 2 04/17/2014   Lab Results  Component Value Date   LDLDIRECT 170.5 04/30/2012   LDLDIRECT 144.6 10/31/2011   LDLDIRECT 137.0 12/01/2010   Pravastatin and diet   Results for orders placed or performed in visit on 05/20/16  Comprehensive metabolic panel  Result Value Ref Range   Sodium 142 135 - 145 mEq/L   Potassium  5.3 (H) 3.5 - 5.1 mEq/L   Chloride 109 96 - 112 mEq/L   CO2 29 19 - 32 mEq/L   Glucose, Bld 99 70 - 99 mg/dL   BUN 14 6 - 23 mg/dL   Creatinine, Ser 0.96 0.40 - 1.20 mg/dL   Total Bilirubin 0.4 0.2 - 1.2 mg/dL   Alkaline Phosphatase 78 39 - 117 U/L   AST 16 0 - 37 U/L   ALT 11 0 - 35 U/L   Total Protein 6.3 6.0 - 8.3 g/dL   Albumin 3.8 3.5 - 5.2 g/dL   Calcium 9.9 8.4 - 10.5 mg/dL   GFR 61.08 >60.00 mL/min  CBC with Differential/Platelet  Result Value Ref Range   WBC 5.8 4.0 - 10.5 K/uL   RBC 4.21 3.87 - 5.11 Mil/uL   Hemoglobin 13.1 12.0 - 15.0 g/dL   HCT 38.7 36.0 - 46.0 %   MCV 91.8 78.0 - 100.0 fl   MCHC 33.8 30.0 - 36.0 g/dL   RDW 12.6 11.5 - 15.5 %   Platelets 291.0 150.0 - 400.0 K/uL   Neutrophils Relative % 42.1 (L) 43.0 - 77.0 %   Lymphocytes Relative  42.6 12.0 - 46.0 %   Monocytes Relative 7.4 3.0 - 12.0 %   Eosinophils Relative 7.1 (H) 0.0 - 5.0 %   Basophils Relative 0.8 0.0 - 3.0 %   Neutro Abs 2.4 1.4 - 7.7 K/uL   Lymphs Abs 2.5 0.7 - 4.0 K/uL   Monocytes Absolute 0.4 0.1 - 1.0 K/uL   Eosinophils Absolute 0.4 0.0 - 0.7 K/uL   Basophils Absolute 0.0 0.0 - 0.1 K/uL  Lipid panel  Result Value Ref Range   Cholesterol 169 0 - 200 mg/dL   Triglycerides 86.0 0.0 - 149.0 mg/dL   HDL 73.40 >39.00 mg/dL   VLDL 17.2 0.0 - 40.0 mg/dL   LDL Cholesterol 78 0 - 99 mg/dL   Total CHOL/HDL Ratio 2    NonHDL 95.50   TSH  Result Value Ref Range   TSH 3.28 0.35 - 4.50 uIU/mL  VITAMIN D 25 Hydroxy (Vit-D Deficiency, Fractures)  Result Value Ref Range   VITD 39.78 30.00 - 100.00 ng/mL    Patient Active Problem List   Diagnosis Date Noted  . Need for hepatitis C screening test 05/24/2016  . New onset of headaches after age 64 07/22/2015  . Estrogen deficiency 04/27/2015  . Routine general medical examination at a health care facility 04/19/2015  . Osteopenia 04/25/2014  . Encounter for Medicare annual wellness exam 11/07/2012  . TMJ (temporomandibular joint syndrome)  02/24/2012  . Insomnia 02/21/2011  . Family history of colon cancer 11/01/2010  . Hyperlipidemia 11/01/2010  . Vitamin D deficiency 10/10/2008  . IRRITABLE BOWEL SYNDROME 10/15/2007  . VARICOSE VEINS, LOWER EXTREMITIES 08/15/2007  . Disorder of bone and cartilage 05/14/2007  . DEPRESSION 05/11/2007  . ALLERGIC RHINITIS, SEASONAL 05/11/2007  . GERD 05/11/2007  . FIBROCYSTIC BREAST DISEASE 05/11/2007  . POSTMENOPAUSAL STATUS 05/11/2007   Past Medical History:  Diagnosis Date  . Allergic rhinitis due to pollen   . Asymptomatic varicose veins   . Bladder tumor 11/05  . CAD (coronary artery disease)   . DDD (degenerative disc disease)    in neck, neurosurgeon Dr. Trenton Gammon  . Depression   . Diffuse cystic mastopathy   . Diverticulosis   . Dyspnea 2008   negative echo and MV scan  . Esophageal reflux   . History of gallstones   . Hypoglycemia, unspecified   . Irritable bowel syndrome   . Lumbago   . Palpitations 03/06/09   event monitor  . Pure hypercholesterolemia   . Stress fracture of foot 03/28/99   left  . Symptomatic menopausal or female climacteric states   . Unspecified adverse effect of other drug, medicinal and biological substance(995.29)   . Unspecified vitamin D deficiency    Past Surgical History:  Procedure Laterality Date  . BLADDER SURGERY     tumor removed  . CHOLECYSTECTOMY     gallstones- ultrasound 10/03   Social History  Substance Use Topics  . Smoking status: Never Smoker  . Smokeless tobacco: Never Used  . Alcohol use 0.0 oz/week     Comment: Rare-wine   Family History  Problem Relation Age of Onset  . Hyperlipidemia Mother   . Stroke Mother   . Arthritis Mother   . Macular degeneration Mother   . Irritable bowel syndrome Mother   . Colon cancer Father   . Heart disease Father     MI's  . COPD Sister     heavy smoker  . Colon polyps Maternal Aunt   . Breast cancer Paternal Aunt   .  Osteopenia Sister   . Other Daughter     celiac  spruce  . Diabetes Brother   . Diabetes Paternal Uncle   . Irritable bowel syndrome Sister   . Kidney disease Cousin   . Rectal cancer Neg Hx   . Stomach cancer Neg Hx    Allergies  Allergen Reactions  . Amoxicillin     Rash and diarrhea   . Codeine     REACTION: unknown reaction  . Flexeril [Cyclobenzaprine]     sedation  . Klonopin [Clonazepam]     Dizziness , fatigue  . Nsaids     REACTION: diarrhea  . Omeprazole     REACTION: did not work for her  . Pseudoephedrine     REACTION: jittery   Current Outpatient Prescriptions on File Prior to Visit  Medication Sig Dispense Refill  . acetaminophen (TYLENOL) 500 MG tablet Take 1,000 mg by mouth every 6 (six) hours as needed for mild pain.     . Calcium-Magnesium-Vitamin D (CALCIUM 1200+D3 PO) Take 1 tablet by mouth daily.    Marland Kitchen esomeprazole (NEXIUM) 40 MG capsule Take 1 capsule (40 mg total) by mouth daily at 12 noon. 30 capsule 11  . fexofenadine (ALLEGRA) 60 MG tablet Take 180 mg by mouth daily as needed for allergies.     . mometasone (NASONEX) 50 MCG/ACT nasal spray Place 2 sprays into the nose daily as needed (allergies). 17 g 11   No current facility-administered medications on file prior to visit.     Review of Systems    Review of Systems  Constitutional: Negative for fever, appetite change, fatigue and unexpected weight change.  Eyes: Negative for pain and visual disturbance.  Respiratory: Negative for cough and shortness of breath.   Cardiovascular: Negative for cp or palpitations    Gastrointestinal: Negative for nausea, diarrhea and constipation.  Genitourinary: Negative for urgency and frequency.  Skin: Negative for pallor or rash   Neurological: Negative for weakness, light-headedness, numbness and headaches.  Hematological: Negative for adenopathy. Does not bruise/bleed easily.  Psychiatric/Behavioral: Negative for dysphoric mood. The patient is not nervous/anxious.      Objective:   Physical Exam    Constitutional: She appears well-developed and well-nourished. No distress.  Well appearing   HENT:  Head: Normocephalic and atraumatic.  Right Ear: External ear normal.  Left Ear: External ear normal.  Mouth/Throat: Oropharynx is clear and moist.  Eyes: Conjunctivae and EOM are normal. Pupils are equal, round, and reactive to light. No scleral icterus.  Neck: Normal range of motion. Neck supple. No JVD present. Carotid bruit is not present. No thyromegaly present.  Cardiovascular: Normal rate, regular rhythm, normal heart sounds and intact distal pulses.  Exam reveals no gallop.   Pulmonary/Chest: Effort normal and breath sounds normal. No respiratory distress. She has no wheezes. She exhibits no tenderness.  Abdominal: Soft. Bowel sounds are normal. She exhibits no distension, no abdominal bruit and no mass. There is no tenderness.  Genitourinary: No breast swelling, tenderness, discharge or bleeding.  Genitourinary Comments: Breast exam: No mass, nodules, thickening, tenderness, bulging, retraction, inflamation, nipple discharge or skin changes noted.  No axillary or clavicular LA.      Musculoskeletal: Normal range of motion. She exhibits no edema or tenderness.  No kyphosis   Lymphadenopathy:    She has no cervical adenopathy.  Neurological: She is alert. She has normal reflexes. No cranial nerve deficit. She exhibits normal muscle tone. Coordination normal.  Skin: Skin is warm and dry.  No rash noted. No erythema. No pallor.  Lentigines diffusely  Psychiatric: She has a normal mood and affect.          Assessment & Plan:   Problem List Items Addressed This Visit      Musculoskeletal and Integument   Osteopenia    dexa 11/16 No falls or fx Exercises D level is therapeutic  Repeat in a year        Other   Hyperlipidemia    Disc goals for lipids and reasons to control them Rev labs with pt Rev low sat fat diet in detail  Very well controlled with pravastatin and  diet       Relevant Medications   pravastatin (PRAVACHOL) 20 MG tablet   Need for hepatitis C screening test    Hep C ab test today Low risk      Relevant Orders   Hepatitis C antibody   Routine general medical examination at a health care facility - Primary    Reviewed health habits including diet and exercise and skin cancer prevention Reviewed appropriate screening tests for age  Also reviewed health mt list, fam hx and immunization status , as well as social and family history   See HPI Labs reviewed  Will schedule her AMW visit Labs for hep C screening today  If you are interested in a shingles/zoster vaccine - call your insurance to check on coverage,( you should not get it within 1 month of other vaccines) , then call us for a prescription  for it to take to a pharmacy that gives the shot , or make a nurse visit to get it here depending on your coverage Take a look at the handout regarding a tetanus shot - you may be able to get it at a pharmacy  don't forget to make your mammogram appointment at the breast center  Stop at check out for AMW visit       Vitamin D deficiency    Vitamin D level is therapeutic with current supplementation Disc importance of this to bone and overall health

## 2016-05-24 NOTE — Assessment & Plan Note (Signed)
Vitamin D level is therapeutic with current supplementation Disc importance of this to bone and overall health  

## 2016-05-24 NOTE — Patient Instructions (Addendum)
Labs for hep C screening today  If you are interested in a shingles/zoster vaccine - call your insurance to check on coverage,( you should not get it within 1 month of other vaccines) , then call us for a prescription  for it to take to a pharmacy that gives the shot , or make a nurse visit to get it here depending on your coverage Take a look at the handout regarding a tetanus shot - you may be able to get it at a pharmacy  don't forget to make your mammogram appointment at the breast center  Stop at check out to schedule AMW visit

## 2016-05-24 NOTE — Progress Notes (Signed)
Pre visit review using our clinic review tool, if applicable. No additional management support is needed unless otherwise documented below in the visit note. 

## 2016-05-24 NOTE — Assessment & Plan Note (Signed)
Hep C ab test today Low risk

## 2016-05-24 NOTE — Assessment & Plan Note (Signed)
Reviewed health habits including diet and exercise and skin cancer prevention Reviewed appropriate screening tests for age  Also reviewed health mt list, fam hx and immunization status , as well as social and family history   See HPI Labs reviewed  Will schedule her AMW visit Labs for hep C screening today  If you are interested in a shingles/zoster vaccine - call your insurance to check on coverage,( you should not get it within 1 month of other vaccines) , then call us for a prescription  for it to take to a pharmacy that gives the shot , or make a nurse visit to get it here depending on your coverage Take a look at the handout regarding a tetanus shot - you may be able to get it at a pharmacy  don't forget to make your mammogram appointment at the breast center  Stop at check out for AMW visit

## 2016-05-24 NOTE — Assessment & Plan Note (Signed)
dexa 11/16 No falls or fx Exercises D level is therapeutic  Repeat in a year

## 2016-05-25 LAB — HEPATITIS C ANTIBODY: HCV AB: NEGATIVE

## 2016-05-31 ENCOUNTER — Ambulatory Visit (INDEPENDENT_AMBULATORY_CARE_PROVIDER_SITE_OTHER): Payer: Medicare Other

## 2016-05-31 VITALS — BP 96/60 | HR 75 | Temp 98.0°F | Ht 62.0 in | Wt 145.5 lb

## 2016-05-31 DIAGNOSIS — Z0000 Encounter for general adult medical examination without abnormal findings: Secondary | ICD-10-CM

## 2016-05-31 NOTE — Progress Notes (Signed)
Pre visit review using our clinic review tool, if applicable. No additional management support is needed unless otherwise documented below in the visit note. 

## 2016-05-31 NOTE — Progress Notes (Signed)
PCP notes:   Health maintenance:  No gaps identified. Health maintenance schedule reviewed with patient.   Abnormal screenings:   Hearing - failed  Patient concerns:   None  Nurse concerns:  None  Next PCP appt:   N/A; pt recently had CPE  I reviewed health advisor's note, was available for consultation, and agree with documentation and plan.  Loura Pardon MD

## 2016-05-31 NOTE — Patient Instructions (Signed)
Ms. Vender , Thank you for taking time to come for your Medicare Wellness Visit. I appreciate your ongoing commitment to your health goals. Please review the following plan we discussed and let me know if I can assist you in the future.   These are the goals we discussed: Goals    . Increase physical activity          Starting 05/31/2016, I will continue to walk for at last 30-45 min daily.        This is a list of the screening recommended for you and due dates:  Health Maintenance  Topic Date Due  . Shingles Vaccine  08/20/2023*  . Tetanus Vaccine  08/20/2023*  . Colon Cancer Screening  12/19/2016  . Mammogram  05/19/2017  . Flu Shot  Completed  . DEXA scan (bone density measurement)  Completed  .  Hepatitis C: One time screening is recommended by Center for Disease Control  (CDC) for  adults born from [birth year] through 1965.   Completed  . Pneumonia vaccines  Completed  *Topic was postponed. The date shown is not the original due date.   Preventive Care for Adults  A healthy lifestyle and preventive care can promote health and wellness. Preventive health guidelines for adults include the following key practices.  . A routine yearly physical is a good way to check with your health care provider about your health and preventive screening. It is a chance to share any concerns and updates on your health and to receive a thorough exam.  . Visit your dentist for a routine exam and preventive care every 6 months. Brush your teeth twice a day and floss once a day. Good oral hygiene prevents tooth decay and gum disease.  . The frequency of eye exams is based on your age, health, family medical history, use  of contact lenses, and other factors. Follow your health care provider's ecommendations for frequency of eye exams.  . Eat a healthy diet. Foods like vegetables, fruits, whole grains, low-fat dairy products, and lean protein foods contain the nutrients you need without too many  calories. Decrease your intake of foods high in solid fats, added sugars, and salt. Eat the right amount of calories for you. Get information about a proper diet from your health care provider, if necessary.  . Regular physical exercise is one of the most important things you can do for your health. Most adults should get at least 150 minutes of moderate-intensity exercise (any activity that increases your heart rate and causes you to sweat) each week. In addition, most adults need muscle-strengthening exercises on 2 or more days a week.  Silver Sneakers may be a benefit available to you. To determine eligibility, you may visit the website: www.silversneakers.com or contact program at 260-829-7345 Mon-Fri between 8AM-8PM.   . Maintain a healthy weight. The body mass index (BMI) is a screening tool to identify possible weight problems. It provides an estimate of body fat based on height and weight. Your health care provider can find your BMI and can help you achieve or maintain a healthy weight.   For adults 20 years and older: ? A BMI below 18.5 is considered underweight. ? A BMI of 18.5 to 24.9 is normal. ? A BMI of 25 to 29.9 is considered overweight. ? A BMI of 30 and above is considered obese.   . Maintain normal blood lipids and cholesterol levels by exercising and minimizing your intake of saturated fat. Eat a balanced diet  with plenty of fruit and vegetables. Blood tests for lipids and cholesterol should begin at age 48 and be repeated every 5 years. If your lipid or cholesterol levels are high, you are over 50, or you are at high risk for heart disease, you may need your cholesterol levels checked more frequently. Ongoing high lipid and cholesterol levels should be treated with medicines if diet and exercise are not working.  . If you smoke, find out from your health care provider how to quit. If you do not use tobacco, please do not start.  . If you choose to drink alcohol, please do  not consume more than 2 drinks per day. One drink is considered to be 12 ounces (355 mL) of beer, 5 ounces (148 mL) of wine, or 1.5 ounces (44 mL) of liquor.  . If you are 21-59 years old, ask your health care provider if you should take aspirin to prevent strokes.  . Use sunscreen. Apply sunscreen liberally and repeatedly throughout the day. You should seek shade when your shadow is shorter than you. Protect yourself by wearing long sleeves, pants, a wide-brimmed hat, and sunglasses year round, whenever you are outdoors.  . Once a month, do a whole body skin exam, using a mirror to look at the skin on your back. Tell your health care provider of new moles, moles that have irregular borders, moles that are larger than a pencil eraser, or moles that have changed in shape or color.

## 2016-05-31 NOTE — Progress Notes (Signed)
Subjective:   Diane Delacruz is a 70 y.o. female who presents for Medicare Annual (Subsequent) preventive examination.  Review of Systems:  N/A Cardiac Risk Factors include: advanced age (>61men, >24 women);dyslipidemia     Objective:     Vitals: BP 96/60 (BP Location: Left Arm, Patient Position: Sitting, Cuff Size: Normal)   Pulse 75   Temp 98 F (36.7 C) (Oral)   Ht 5\' 2"  (1.575 m) Comment: no shoes  Wt 145 lb 8 oz (66 kg)   LMP 07/19/1995   SpO2 97%   BMI 26.61 kg/m   Body mass index is 26.61 kg/m.   Tobacco History  Smoking Status  . Never Smoker  Smokeless Tobacco  . Never Used     Counseling given: No   Past Medical History:  Diagnosis Date  . Allergic rhinitis due to pollen   . Asymptomatic varicose veins   . Bladder tumor 11/05  . CAD (coronary artery disease)   . DDD (degenerative disc disease)    in neck, neurosurgeon Dr. Trenton Gammon  . Depression   . Diffuse cystic mastopathy   . Diverticulosis   . Dyspnea 2008   negative echo and MV scan  . Esophageal reflux   . History of gallstones   . Hypoglycemia, unspecified   . Irritable bowel syndrome   . Lumbago   . Palpitations 03/06/09   event monitor  . Pure hypercholesterolemia   . Stress fracture of foot 03/28/99   left  . Symptomatic menopausal or female climacteric states   . Unspecified adverse effect of other drug, medicinal and biological substance(995.29)   . Unspecified vitamin D deficiency    Past Surgical History:  Procedure Laterality Date  . BLADDER SURGERY     tumor removed  . CHOLECYSTECTOMY     gallstones- ultrasound 10/03   Family History  Problem Relation Age of Onset  . Hyperlipidemia Mother   . Stroke Mother   . Arthritis Mother   . Macular degeneration Mother   . Irritable bowel syndrome Mother   . Colon cancer Father   . Heart disease Father     MI's  . COPD Sister     heavy smoker  . Colon polyps Maternal Aunt   . Breast cancer Paternal Aunt   . Osteopenia  Sister   . Other Daughter     celiac spruce  . Diabetes Brother   . Diabetes Paternal Uncle   . Irritable bowel syndrome Sister   . Kidney disease Cousin   . Rectal cancer Neg Hx   . Stomach cancer Neg Hx    History  Sexual Activity  . Sexual activity: No    Outpatient Encounter Prescriptions as of 05/31/2016  Medication Sig  . acetaminophen (TYLENOL) 500 MG tablet Take 1,000 mg by mouth every 6 (six) hours as needed for mild pain.   . Calcium-Magnesium-Vitamin D (CALCIUM 1200+D3 PO) Take 1 tablet by mouth daily.  Marland Kitchen esomeprazole (NEXIUM) 40 MG capsule Take 1 capsule (40 mg total) by mouth daily at 12 noon.  . fexofenadine (ALLEGRA) 60 MG tablet Take 180 mg by mouth daily as needed for allergies.   . mometasone (NASONEX) 50 MCG/ACT nasal spray Place 2 sprays into the nose daily as needed (allergies).  . pravastatin (PRAVACHOL) 20 MG tablet Take 1 tablet (20 mg total) by mouth daily.   No facility-administered encounter medications on file as of 05/31/2016.     Activities of Daily Living In your present state of health, do  you have any difficulty performing the following activities: 05/31/2016  Hearing? N  Vision? N  Difficulty concentrating or making decisions? N  Walking or climbing stairs? N  Dressing or bathing? N  Doing errands, shopping? N  Preparing Food and eating ? N  Using the Toilet? N  In the past six months, have you accidently leaked urine? N  Do you have problems with loss of bowel control? N  Managing your Medications? N  Managing your Finances? N  Housekeeping or managing your Housekeeping? N  Some recent data might be hidden    Patient Care Team: Abner Greenspan, MD as PCP - General Thelma Comp, OD as Consulting Physician (Optometry)    Assessment:     Hearing Screening   125Hz  250Hz  500Hz  1000Hz  2000Hz  3000Hz  4000Hz  6000Hz  8000Hz   Right ear:   40 40 40  40    Left ear:   0 0 40  0    Vision Screening Comments: Last vision exam in Sept 2017  with Dr. Maryruth Hancock B.    Exercise Activities and Dietary recommendations Current Exercise Habits: Home exercise routine, Type of exercise: walking;treadmill, Time (Minutes): 45, Frequency (Times/Week): 7, Weekly Exercise (Minutes/Week): 315, Intensity: Mild, Exercise limited by: None identified  Goals    . Increase physical activity          Starting 05/31/2016, I will continue to walk for at last 30-45 min daily.       Fall Risk Fall Risk  05/31/2016 08/18/2015 04/27/2015 04/25/2014 11/14/2012  Falls in the past year? No No No No No   Depression Screen PHQ 2/9 Scores 05/31/2016 04/27/2015 04/25/2014 11/14/2012  PHQ - 2 Score 0 0 0 0     Cognitive Function MMSE - Mini Mental State Exam 05/31/2016  Orientation to time 5  Orientation to Place 5  Registration 3  Attention/ Calculation 0  Recall 3  Language- name 2 objects 0  Language- repeat 1  Language- follow 3 step command 3  Language- read & follow direction 0  Write a sentence 0  Copy design 0  Total score 20     PLEASE NOTE: A Mini-Cog screen was completed. Maximum score is 20. A value of 0 denotes this part of Folstein MMSE was not completed or the patient failed this part of the Mini-Cog screening.   Mini-Cog Screening Orientation to Time - Max 5 pts Orientation to Place - Max 5 pts Registration - Max 3 pts Recall - Max 3 pts Language Repeat - Max 1 pts Language Follow 3 Step Command - Max 3 pts     Immunization History  Administered Date(s) Administered  . Influenza Split 04/26/2011  . Influenza,inj,Quad PF,36+ Mos 04/25/2014, 04/27/2015  . Influenza-Unspecified 04/27/2016  . Pneumococcal Conjugate-13 04/25/2014  . Pneumococcal Polysaccharide-23 11/02/2011  . Td 11/11/2002   Screening Tests Health Maintenance  Topic Date Due  . ZOSTAVAX  08/20/2023 (Originally 06/17/2006)  . TETANUS/TDAP  08/20/2023 (Originally 11/10/2012)  . COLONOSCOPY  12/19/2016  . MAMMOGRAM  05/19/2017  . INFLUENZA VACCINE  Completed    . DEXA SCAN  Completed  . Hepatitis C Screening  Completed  . PNA vac Low Risk Adult  Completed      Plan:     I have personally reviewed and addressed the Medicare Annual Wellness questionnaire and have noted the following in the patient's chart:  A. Medical and social history B. Use of alcohol, tobacco or illicit drugs  C. Current medications and supplements D. Functional ability and  status E.  Nutritional status F.  Physical activity G. Advance directives H. List of other physicians I.  Hospitalizations, surgeries, and ER visits in previous 12 months J.  [place] to include hearing, vision, cognitive, depression L. Referrals and appointments - none  In addition, I have reviewed and discussed with patient certain preventive protocols, quality metrics, and best practice recommendations. A written personalized care plan for preventive services as well as general preventive health recommendations were provided to patient.  See attached scanned questionnaire for additional information.   Signed,   Lindell Noe, MHA, BS, LPN Health Coach

## 2016-08-18 ENCOUNTER — Ambulatory Visit (INDEPENDENT_AMBULATORY_CARE_PROVIDER_SITE_OTHER): Payer: Medicare Other | Admitting: Primary Care

## 2016-08-18 VITALS — BP 122/78 | HR 64 | Temp 98.7°F | Ht 62.0 in | Wt 154.4 lb

## 2016-08-18 DIAGNOSIS — R350 Frequency of micturition: Secondary | ICD-10-CM

## 2016-08-18 DIAGNOSIS — J069 Acute upper respiratory infection, unspecified: Secondary | ICD-10-CM | POA: Diagnosis not present

## 2016-08-18 LAB — POC URINALSYSI DIPSTICK (AUTOMATED)
Bilirubin, UA: NEGATIVE
Glucose, UA: NEGATIVE
KETONES UA: NEGATIVE
LEUKOCYTES UA: NEGATIVE
Nitrite, UA: NEGATIVE
PH UA: 7
PROTEIN UA: NEGATIVE
RBC UA: NEGATIVE
Spec Grav, UA: 1.01
Urobilinogen, UA: NEGATIVE

## 2016-08-18 MED ORDER — AZITHROMYCIN 250 MG PO TABS: ORAL_TABLET | ORAL | 0 refills | Status: DC

## 2016-08-18 MED ORDER — BENZONATATE 200 MG PO CAPS: 200.0000 mg | ORAL_CAPSULE | Freq: Three times a day (TID) | ORAL | 0 refills | Status: DC | PRN

## 2016-08-18 NOTE — Patient Instructions (Signed)
Your urine test did not show evidence for infection.  Start Azithromycin antibiotics. Take 2 tablets by mouth today, then 1 tablet daily for 4 additional days.  You may take Benzonatate capsules for cough. Take 1 capsule by mouth three times daily as needed for cough.  Ensure you are staying hydrated with fluids and rest.  It was a pleasure meeting you!

## 2016-08-18 NOTE — Progress Notes (Signed)
Subjective:    Patient ID: Diane Delacruz, female    DOB: Jun 15, 1946, 71 y.o.   MRN: [id number]  HPI  Diane Delacruz is a 71 year old female who presents today with multiple complaints.  1) Cough: Also with sore throat, nausea, headache. Her symptoms began several weeks ago. Over the last 2-3 days she's feeling worse as her cough is continuous and she's feeling more fatigued. She denies fevers, sick contacts. Her cough is occasionally productive with yellow/green sputum. She's taken Mucinex with temporary improvement.   2) Urinary Frequency: History of UTI's in the past. Present for the past 1-2 weeks. She also reports nausea, dysuria, right groin pain. She denies hematuria, abdominal pain, flank pain. She's not taken anything OTC for her symptoms.   Review of Systems  Constitutional: Positive for chills and fatigue. Negative for fever.  HENT: Positive for congestion and sore throat. Negative for ear pain and sinus pressure.   Respiratory: Positive for cough. Negative for shortness of breath and wheezing.   Cardiovascular: Negative for chest pain.  Genitourinary: Positive for dysuria and frequency. Negative for flank pain, pelvic pain and vaginal discharge.       Past Medical History:  Diagnosis Date  . Allergic rhinitis due to pollen   . Asymptomatic varicose veins   . Bladder tumor 11/05  . CAD (coronary artery disease)   . DDD (degenerative disc disease)    in neck, neurosurgeon Dr. Trenton Gammon  . Depression   . Diffuse cystic mastopathy   . Diverticulosis   . Dyspnea 2008   negative echo and MV scan  . Esophageal reflux   . History of gallstones   . Hypoglycemia, unspecified   . Irritable bowel syndrome   . Lumbago   . Palpitations 03/06/09   event monitor  . Pure hypercholesterolemia   . Stress fracture of foot 03/28/99   left  . Symptomatic menopausal or female climacteric states   . Unspecified adverse effect of other drug, medicinal and biological substance(995.29)   .  Unspecified vitamin D deficiency      Social History   Social History  . Marital status: Married    Spouse name: N/A  . Number of children: 1  . Years of education: N/A   Occupational History  . retired   .  Unemployed   Social History Main Topics  . Smoking status: Never Smoker  . Smokeless tobacco: Never Used  . Alcohol use 0.0 oz/week     Comment: Rare-wine  . Drug use: No  . Sexual activity: No   Other Topics Concern  . 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 BLADDER SURGERY     tumor removed  . CHOLECYSTECTOMY     gallstones- ultrasound 10/03    Family History  Problem Relation Age of Onset  . Hyperlipidemia Mother   . Stroke Mother   . Arthritis Mother   . Macular degeneration Mother   . Irritable bowel syndrome Mother   . Colon cancer Father   . Heart disease Father     MI's  . COPD Sister     heavy smoker  . Colon polyps Maternal Aunt   . Breast cancer Paternal Aunt   . Osteopenia Sister   . Other Daughter     celiac spruce  . Diabetes Brother   .  Diabetes Paternal Uncle   . Irritable bowel syndrome Sister   . Kidney disease Cousin   . Rectal cancer Neg Hx   . Stomach cancer Neg Hx     Allergies  Allergen Reactions  . Amoxicillin     Rash and diarrhea   . Codeine     REACTION: unknown reaction  . Flexeril [Cyclobenzaprine]     sedation  . Klonopin [Clonazepam]     Dizziness , fatigue  . Nsaids     REACTION: diarrhea  . Omeprazole     REACTION: did not work for her  . Pseudoephedrine     REACTION: jittery    Current Outpatient Prescriptions on File Prior to Visit  Medication Sig Dispense Refill  . acetaminophen (TYLENOL) 500 MG tablet Take 1,000 mg by mouth every 6 (six) hours as needed for mild pain.     . Calcium-Magnesium-Vitamin D (CALCIUM 1200+D3 PO) Take 1 tablet by mouth daily.    Marland Kitchen  esomeprazole (NEXIUM) 40 MG capsule Take 1 capsule (40 mg total) by mouth daily at 12 noon. 30 capsule 11  . fexofenadine (ALLEGRA) 60 MG tablet Take 180 mg by mouth daily as needed for allergies.     . mometasone (NASONEX) 50 MCG/ACT nasal spray Place 2 sprays into the nose daily as needed (allergies). 17 g 11  . pravastatin (PRAVACHOL) 20 MG tablet Take 1 tablet (20 mg total) by mouth daily. 90 tablet 3   No current facility-administered medications on file prior to visit.     BP 122/78   Pulse 64   Temp 98.7 F (37.1 C) (Oral)   Ht 5\' 2"  (1.575 m)   Wt 154 lb 6.4 oz (70 kg)   LMP 07/19/1995   SpO2 97%   BMI 28.24 kg/m    Objective:   Physical Exam  Constitutional: She appears well-nourished. She appears ill.  HENT:  Right Ear: Tympanic membrane and ear canal normal.  Left Ear: Tympanic membrane and ear canal normal.  Nose: Right sinus exhibits no maxillary sinus tenderness and no frontal sinus tenderness. Left sinus exhibits no maxillary sinus tenderness and no frontal sinus tenderness.  Mouth/Throat: Oropharynx is clear and moist.  Eyes: Conjunctivae are normal.  Neck: Neck supple.  Cardiovascular: Normal rate and regular rhythm.   Pulmonary/Chest: Effort normal and breath sounds normal. She has no wheezes. She has no rales.  Abdominal: There is no CVA tenderness.  Lymphadenopathy:    She has no cervical adenopathy.  Skin: Skin is warm and dry.          Assessment & Plan:  URI:  Cough, congestion, fatigue x 2 weeks, now feeling worse. Temporary improvement with OTC treatment. Exam today with mostly clear lungs, however, she does appear ill. Given duration of symptoms and presentation will treat. Rx for Zpak and Tessalon Pearls sent to pharmacy. UA: Negative. Suspect nausea related to URI. Fluids, rest, follow up PRN.  Sheral Flow, NP

## 2016-08-18 NOTE — Addendum Note (Signed)
Addended by: Jacqualin Combes on: 08/18/2016 11:02 AM   Modules accepted: Orders

## 2016-08-18 NOTE — Progress Notes (Signed)
Pre visit review using our clinic review tool, if applicable. No additional management support is needed unless otherwise documented below in the visit note. 

## 2016-08-25 ENCOUNTER — Telehealth: Payer: Self-pay | Admitting: *Deleted

## 2016-08-25 MED ORDER — PROMETHAZINE-DM 6.25-15 MG/5ML PO SYRP: 5.0000 mL | ORAL_SOLUTION | Freq: Four times a day (QID) | ORAL | 0 refills | Status: DC | PRN

## 2016-08-25 NOTE — Telephone Encounter (Signed)
See response

## 2016-08-25 NOTE — Telephone Encounter (Signed)
Received message on triage voicemail, pt c/o of cough worse saw Dr. Carlis Abbott last Thurs, would like something called in if possible. Pt is scheduled to come in on Monday 2/12.

## 2016-08-25 NOTE — Addendum Note (Signed)
Addended by: Loura Pardon A on: 08/25/2016 04:08 PM   Modules accepted: Orders

## 2016-08-25 NOTE — Telephone Encounter (Signed)
She was px zithromax-please confirm that she took it  Did the tessalon help?   Cough productive? Fever? Any sob or wheezing? --thanks

## 2016-08-25 NOTE — Telephone Encounter (Signed)
See my note- got cut off in the middle, thanks

## 2016-08-25 NOTE — Telephone Encounter (Signed)
Pt said she did take all of the zpak abx. Pt said she has no fever, no sob, and no wheezing. Pt said sometimes the cough is productive and when she does she coughs up yellow phlegm. Pt said her cough is worse now that it was at her OV

## 2016-08-25 NOTE — Telephone Encounter (Signed)
I wrote px for promethazine dm for call in  I see codeine on her allergy list  Depending on her rxn hx to that - (if not a full allergy) perhaps she could take hydrocodone- in which case I could consider that (and it would be stronger but sedating)  I am not in the office to print it but could do so tomorrow if

## 2016-08-26 MED ORDER — PROMETHAZINE-DM 6.25-15 MG/5ML PO SYRP: 5.0000 mL | ORAL_SOLUTION | Freq: Four times a day (QID) | ORAL | 0 refills | Status: DC | PRN

## 2016-08-26 NOTE — Addendum Note (Signed)
Addended by: Tammi Sou on: 08/26/2016 08:28 AM   Modules accepted: Orders

## 2016-08-26 NOTE — Telephone Encounter (Signed)
Pt said she thinks she broke out in hives/rash last time she took codeine so she is afraid to use the cough med with hydrocodone in it she wants to try the promethazine med. Rx sent and pt has a f/u with Dr. Glori Bickers on Monday but if med helps she will call back and cancel it

## 2016-08-29 ENCOUNTER — Ambulatory Visit (INDEPENDENT_AMBULATORY_CARE_PROVIDER_SITE_OTHER): Payer: Medicare Other | Admitting: Family Medicine

## 2016-08-29 ENCOUNTER — Encounter: Payer: Self-pay | Admitting: Family Medicine

## 2016-08-29 VITALS — BP 104/60 | HR 73 | Temp 98.2°F | Ht 62.0 in | Wt 149.8 lb

## 2016-08-29 DIAGNOSIS — R05 Cough: Secondary | ICD-10-CM | POA: Diagnosis not present

## 2016-08-29 DIAGNOSIS — J069 Acute upper respiratory infection, unspecified: Secondary | ICD-10-CM

## 2016-08-29 DIAGNOSIS — R058 Other specified cough: Secondary | ICD-10-CM | POA: Insufficient documentation

## 2016-08-29 MED ORDER — HYDROCODONE-HOMATROPINE 5-1.5 MG/5ML PO SYRP: 5.0000 mL | ORAL_SOLUTION | Freq: Four times a day (QID) | ORAL | 0 refills | Status: DC | PRN

## 2016-08-29 MED ORDER — BENZONATATE 200 MG PO CAPS: 200.0000 mg | ORAL_CAPSULE | Freq: Three times a day (TID) | ORAL | 1 refills | Status: DC | PRN

## 2016-08-29 NOTE — Progress Notes (Signed)
Subjective:    Patient ID: Diane Delacruz, female    DOB: 06/18/1946, 71 y.o.   MRN: [id number]  HPI Here for cough/uri that is not getting better   She was seen by Gentry Fitz on 08/18/16 for uri for 2 wk  Treated with zpak and tessalon pearles  Her symptoms are the same -no changes at all  Tessalon does not help the cough much at all, and the prometh dm helps a bit/makes her sleepy   No fever  No sinus pain  No ear or throat pain  Some ear itching Nasal congestion - yellow d/c -sometimes a trace of blood   Cough- is hacking   (after she eats or drinks)  Not brining up much at all- sometimes yellow  No heartburn or reflux symptoms   Taking mucinex over the counter  Not DM    In the past codeine made her jittery   Patient Active Problem List   Diagnosis Date Noted  . Post-viral cough syndrome 08/29/2016  . Need for hepatitis C screening test 05/24/2016  . New onset of headaches after age 80 07/22/2015  . Estrogen deficiency 04/27/2015  . Routine general medical examination at a health care facility 04/19/2015  . Osteopenia 04/25/2014  . Encounter for Medicare annual wellness exam 11/07/2012  . TMJ (temporomandibular joint syndrome) 02/24/2012  . Insomnia 02/21/2011  . Family history of colon cancer 11/01/2010  . Hyperlipidemia 11/01/2010  . Vitamin D deficiency 10/10/2008  . IRRITABLE BOWEL SYNDROME 10/15/2007  . VARICOSE VEINS, LOWER EXTREMITIES 08/15/2007  . Disorder of bone and cartilage 05/14/2007  . DEPRESSION 05/11/2007  . ALLERGIC RHINITIS, SEASONAL 05/11/2007  . GERD 05/11/2007  . FIBROCYSTIC BREAST DISEASE 05/11/2007  . POSTMENOPAUSAL STATUS 05/11/2007   Past Medical History:  Diagnosis Date  . Allergic rhinitis due to pollen   . Asymptomatic varicose veins   . Bladder tumor 11/05  . CAD (coronary artery disease)   . DDD (degenerative disc disease)    in neck, neurosurgeon Dr. Trenton Gammon  . Depression   . Diffuse cystic mastopathy   . Diverticulosis   .  Dyspnea 2008   negative echo and MV scan  . Esophageal reflux   . History of gallstones   . Hypoglycemia, unspecified   . Irritable bowel syndrome   . Lumbago   . Palpitations 03/06/09   event monitor  . Pure hypercholesterolemia   . Stress fracture of foot 03/28/99   left  . Symptomatic menopausal or female climacteric states   . Unspecified adverse effect of other drug, medicinal and biological substance(995.29)   . Unspecified vitamin D deficiency    Past Surgical History:  Procedure Laterality Date  . BLADDER SURGERY     tumor removed  . CHOLECYSTECTOMY     gallstones- ultrasound 10/03   Social History  Substance Use Topics  . Smoking status: Never Smoker  . Smokeless tobacco: Never Used  . Alcohol use 0.0 oz/week     Comment: Rare-wine   Family History  Problem Relation Age of Onset  . Hyperlipidemia Mother   . Stroke Mother   . Arthritis Mother   . Macular degeneration Mother   . Irritable bowel syndrome Mother   . Colon cancer Father   . Heart disease Father     MI's  . COPD Sister     heavy smoker  . Colon polyps Maternal Aunt   . Breast cancer Paternal Aunt   . Osteopenia Sister   . Other Daughter  celiac spruce  . Diabetes Brother   . Diabetes Paternal Uncle   . Irritable bowel syndrome Sister   . Kidney disease Cousin   . Rectal cancer Neg Hx   . Stomach cancer Neg Hx    Allergies  Allergen Reactions  . Amoxicillin     Rash and diarrhea   . Codeine     REACTION: unknown reaction  . Flexeril [Cyclobenzaprine]     sedation  . Klonopin [Clonazepam]     Dizziness , fatigue  . Nsaids     REACTION: diarrhea  . Omeprazole     REACTION: did not work for her  . Pseudoephedrine     REACTION: jittery   Current Outpatient Prescriptions on File Prior to Visit  Medication Sig Dispense Refill  . acetaminophen (TYLENOL) 500 MG tablet Take 1,000 mg by mouth every 6 (six) hours as needed for mild pain.     . Calcium-Magnesium-Vitamin D (CALCIUM  1200+D3 PO) Take 1 tablet by mouth daily.    Marland Kitchen esomeprazole (NEXIUM) 40 MG capsule Take 1 capsule (40 mg total) by mouth daily at 12 noon. 30 capsule 11  . fexofenadine (ALLEGRA) 60 MG tablet Take 180 mg by mouth daily as needed for allergies.     . mometasone (NASONEX) 50 MCG/ACT nasal spray Place 2 sprays into the nose daily as needed (allergies). 17 g 11  . pravastatin (PRAVACHOL) 20 MG tablet Take 1 tablet (20 mg total) by mouth daily. 90 tablet 3  . promethazine-dextromethorphan (PROMETHAZINE-DM) 6.25-15 MG/5ML syrup Take 5 mLs by mouth 4 (four) times daily as needed for cough. 118 mL 0   No current facility-administered medications on file prior to visit.      Review of Systems    Review of Systems  Constitutional: Negative for fever, appetite change, fatigue and unexpected weight change.  Eyes: Negative for pain and visual disturbance.  ENT pos for pnd/ some rhinorrhea Respiratory: Negative for wheeze  and shortness of breath.   Cardiovascular: Negative for cp or palpitations    Gastrointestinal: Negative for nausea, diarrhea and constipation. neg for heartburn  Genitourinary: Negative for urgency and frequency.  Skin: Negative for pallor or rash   Neurological: Negative for weakness, light-headedness, numbness and headaches.  Hematological: Negative for adenopathy. Does not bruise/bleed easily.  Psychiatric/Behavioral: Negative for dysphoric mood. The patient is not nervous/anxious.      Objective:   Physical Exam  Constitutional: She appears well-developed and well-nourished. No distress.  Well appearing   HENT:  Head: Normocephalic and atraumatic.  Right Ear: External ear normal.  Left Ear: External ear normal.  Mouth/Throat: Oropharynx is clear and moist.  Nares are boggy No sinus tenderness Clear rhinorrhea and post nasal drip   Eyes: Conjunctivae and EOM are normal. Pupils are equal, round, and reactive to light. Right eye exhibits no discharge. Left eye exhibits no  discharge.  Neck: Normal range of motion. Neck supple.  Cardiovascular: Normal rate and normal heart sounds.   Pulmonary/Chest: Effort normal and breath sounds normal. No respiratory distress. She has no wheezes. She has no rales. She exhibits no tenderness.  Good air exch Hacking cough No rales or rhonchi  Lymphadenopathy:    She has no cervical adenopathy.  Neurological: She is alert.  Skin: Skin is warm and dry. No rash noted. No pallor.  Psychiatric: She has a normal mood and affect.          Assessment & Plan:   Problem List Items Addressed This Visit  Other   Post-viral cough syndrome    Needs more aggressive cough tx  Will try hycodan (hope it will not have similar side eff to codeine) Use this with caution of sedation  Also tessalon  mucinex for congestion if needed  If not taking hycodan can continue prometh-DM Re assuring exam Update if not starting to improve in a week or if worsening         Other Visit Diagnoses    Acute upper respiratory infection       Relevant Medications   benzonatate (TESSALON) 200 MG capsule

## 2016-08-29 NOTE — Assessment & Plan Note (Signed)
Needs more aggressive cough tx  Will try hycodan (hope it will not have similar side eff to codeine) Use this with caution of sedation  Also tessalon  mucinex for congestion if needed  If not taking hycodan can continue prometh-DM Re assuring exam Update if not starting to improve in a week or if worsening

## 2016-08-29 NOTE — Progress Notes (Signed)
Pre visit review using our clinic review tool, if applicable. No additional management support is needed unless otherwise documented below in the visit note. 

## 2016-08-29 NOTE — Patient Instructions (Signed)
I think you have a post viral cough syndrome  Drink fluids Use tessalon three times daily  Take hydocan cough syrup when not working or driving  The prometh-DM otherwise  Plain mucinex to loosen congestion as needed   Update if not starting to improve in a week or if worsening

## 2016-09-22 ENCOUNTER — Telehealth: Payer: Self-pay

## 2016-09-22 NOTE — Telephone Encounter (Signed)
Pt said she doesn't have a fever productive cough or SOB, just a bad cough, pt would like to just pick up the Rx tomorrow

## 2016-09-22 NOTE — Telephone Encounter (Signed)
Pt left v/m; pt was seen 08/29/16 with cough; the cough got better and the last couple of days the cough has returned; pt wants to know if could get med sent to [facility] or does pt need to be rechecked. Pt request cb.

## 2016-09-22 NOTE — Telephone Encounter (Signed)
If fever or productive cough or sob - then she needs to be seen  The cough med had narcotic so I have to print and sign it  Send this back to me as a reminder to do it when I am in the office tomorrow, thanks

## 2016-09-23 MED ORDER — HYDROCODONE-HOMATROPINE 5-1.5 MG/5ML PO SYRP: 5.0000 mL | ORAL_SOLUTION | Freq: Four times a day (QID) | ORAL | 0 refills | Status: DC | PRN

## 2016-09-23 NOTE — Telephone Encounter (Signed)
Pt notified Rx ready for pick up and pt advised of Dr. Marliss Coots comments

## 2016-09-23 NOTE — Telephone Encounter (Signed)
Printed for pick up  Use caution with this med  F/u if no improvement

## 2016-11-01 ENCOUNTER — Encounter: Payer: Self-pay | Admitting: Gastroenterology

## 2017-05-02 ENCOUNTER — Encounter: Payer: Self-pay | Admitting: Primary Care

## 2017-05-02 ENCOUNTER — Ambulatory Visit (INDEPENDENT_AMBULATORY_CARE_PROVIDER_SITE_OTHER): Payer: Medicare Other | Admitting: Primary Care

## 2017-05-02 VITALS — BP 118/68 | HR 64 | Temp 98.9°F | Ht 62.0 in | Wt 151.8 lb

## 2017-05-02 DIAGNOSIS — Z23 Encounter for immunization: Secondary | ICD-10-CM

## 2017-05-02 DIAGNOSIS — M778 Other enthesopathies, not elsewhere classified: Secondary | ICD-10-CM

## 2017-05-02 MED ORDER — PREDNISONE 10 MG PO TABS: ORAL_TABLET | ORAL | 0 refills | Status: DC

## 2017-05-02 NOTE — Addendum Note (Signed)
Addended by: Jacqualin Combes on: 05/02/2017 05:12 PM   Modules accepted: Orders

## 2017-05-02 NOTE — Progress Notes (Signed)
Subjective:    Patient ID: Diane Delacruz, female    DOB: 1946-01-14, 71 y.o.   MRN: [id number]  HPI  Diane Delacruz is a 71 year old female with a history of osteopenia who presents today with a chief complaint of wrist pain. Her pain is located to the right medial wrist and forearm that began initially in Summer 2018. She had been crocheting blankets during the Summer and finished in July 2018. She's not since crocheted. Since Summer she's experienced intermittent flares of the wrist and forearm pain that will last 2-3 days and then dissipate. Over the past couple of days she's experienced increased pain including pain with curling her hair, pain with picking up a coffee cup, and difficulty opening a bottle.    She denies injury/trauma, numbness/tingling. She's been wearing braces during the day which had historically helped but now cause increased pain. She's been taking Tylenol as she cannot take NSAID's due to GI issues.   Review of Systems  Constitutional: Negative for fever.  Musculoskeletal:       Right medial wrist pain  Skin: Negative for color change.       Inflammation of right wrist  Neurological: Negative for numbness.       Past Medical History:  Diagnosis Date  . Allergic rhinitis due to pollen   . Asymptomatic varicose veins   . Bladder tumor 11/05  . CAD (coronary artery disease)   . DDD (degenerative disc disease)    in neck, neurosurgeon Dr. Trenton Gammon  . Depression   . Diffuse cystic mastopathy   . Diverticulosis   . Dyspnea 2008   negative echo and MV scan  . Esophageal reflux   . History of gallstones   . Hypoglycemia, unspecified   . Irritable bowel syndrome   . Lumbago   . Palpitations 03/06/09   event monitor  . Pure hypercholesterolemia   . Stress fracture of foot 03/28/99   left  . Symptomatic menopausal or female climacteric states   . Unspecified adverse effect of other drug, medicinal and biological substance(995.29)   . Unspecified vitamin D  deficiency      Social History   Social History  . Marital status: Married    Spouse name: N/A  . Number of children: 1  . Years of education: N/A   Occupational History  . retired   .  Unemployed   Social History Main Topics  . Smoking status: Never Smoker  . Smokeless tobacco: Never Used  . Alcohol use 0.0 oz/week     Comment: Rare-wine  . Drug use: No  . Sexual activity: No   Other Topics Concern  . 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 BLADDER SURGERY     tumor removed  . CHOLECYSTECTOMY     gallstones- ultrasound 10/03    Family History  Problem Relation Age of Onset  . Hyperlipidemia Mother   . Stroke Mother   . Arthritis Mother   . Macular degeneration Mother   . Irritable bowel syndrome Mother   . Colon cancer Father   . Heart disease Father        MI's  . COPD Sister        heavy smoker  . Colon polyps Maternal Aunt   . Breast cancer Paternal Aunt   . Osteopenia Sister   .  Other Daughter        celiac spruce  . Diabetes Brother   . Diabetes Paternal Uncle   . Irritable bowel syndrome Sister   . Kidney disease Cousin   . Rectal cancer Neg Hx   . Stomach cancer Neg Hx     Allergies  Allergen Reactions  . Amoxicillin     Rash and diarrhea   . Codeine     REACTION: unknown reaction  . Flexeril [Cyclobenzaprine]     sedation  . Klonopin [Clonazepam]     Dizziness , fatigue  . Nsaids     REACTION: diarrhea  . Omeprazole     REACTION: did not work for her  . Pseudoephedrine     REACTION: jittery    Current Outpatient Prescriptions on File Prior to Visit  Medication Sig Dispense Refill  . acetaminophen (TYLENOL) 500 MG tablet Take 1,000 mg by mouth every 6 (six) hours as needed for mild pain.     . Calcium-Magnesium-Vitamin D (CALCIUM 1200+D3 PO) Take 1 tablet by mouth daily.    Marland Kitchen esomeprazole (NEXIUM)  40 MG capsule Take 1 capsule (40 mg total) by mouth daily at 12 noon. 30 capsule 11  . fexofenadine (ALLEGRA) 60 MG tablet Take 180 mg by mouth daily as needed for allergies.     . mometasone (NASONEX) 50 MCG/ACT nasal spray Place 2 sprays into the nose daily as needed (allergies). 17 g 11  . pravastatin (PRAVACHOL) 20 MG tablet Take 1 tablet (20 mg total) by mouth daily. 90 tablet 3   No current facility-administered medications on file prior to visit.     BP 118/68   Pulse 64   Temp 98.9 F (37.2 C) (Oral)   Ht 5\' 2"  (1.575 m)   Wt 151 lb 12.8 oz (68.9 kg)   LMP 07/19/1995   SpO2 96%   BMI 27.76 kg/m    Objective:   Physical Exam  Cardiovascular: Normal rate.   Pulmonary/Chest: Effort normal.  Musculoskeletal:       Right wrist: She exhibits decreased range of motion, tenderness and swelling. She exhibits no bony tenderness and no deformity.  Mild inflammation to right medial wrist. Tenderness to right medial wrist and mid medial forearm. Decrease in ROM with flexion. Pain with extension, rotation, and flexion. No deformity.  Skin: Skin is warm and dry. No erythema.  Mild inflammation to right medial wrist.           Assessment & Plan:  Wrist Pain:  Located to right wrist intermittently since Summer 2018. Exam today suspicious for tendonitis. No xray needed given lack of trauma and HPI. Discussed the role of NSAID's for tendonitits, she declines given GI history. Also discussed that Nexium would help to prevent GI upset, she continues to decline. Will treat with systemic steroid course of prednisone given lack of tolerability with NSAID's.  Continue to wear braces at night.  She will update in 1 week if no improvement.  Sheral Flow, NP

## 2017-05-02 NOTE — Patient Instructions (Signed)
Start prednisone tablets for wrist inflammation and discomfort. Take four tablets for 2 days, then three tablets for 2 days, then two tablet for 2 days, then one tablet for 2 days.  You may take tylenol as needed, do not exceed 3000 mg in 24 hours.  Try to wear the brace at night when sleeping.  Please update [place] if no improvement in 1 week.  It was a pleasure meeting you!   Tendinitis Tendinitis is inflammation of a tendon. A tendon is a strong cord of tissue that connects muscle to bone. Tendinitis can affect any tendon, but it most commonly affects the shoulder tendon (rotator cuff), ankle tendon (Achilles tendon), elbow tendon (triceps tendon), or one of the tendons in the wrist. What are the causes? This condition may be caused by:  Overusing a tendon or muscle. This is common.  Age-related wear and tear.  Injury.  Inflammatory conditions, such as arthritis.  Certain medicines.  What increases the risk? This condition is more likely to develop in people who do activities that involve repetitive motions. What are the signs or symptoms? Symptoms of this condition may include:  Pain.  Tenderness.  Mild swelling.  How is this diagnosed? This condition is diagnosed with a physical exam. You may also have tests, such as:  Ultrasound. This uses sound waves to make an image of your affected area.  MRI.  How is this treated? This condition may be treated by resting, icing, applying pressure (compression), and raising (elevating) the area above the level of your heart. This is known as RICE therapy. Treatment may also include:  Medicines to help reduce inflammation or to help reduce pain.  Exercises or physical therapy to strengthen and stretch the tendon.  A brace or splint.  Surgery (rare).  Follow these instructions at home:  If you have a splint or brace:  Wear the splint or brace as told by your health care provider. Remove it only as told by your health care  provider.  Loosen the splint or brace if your fingers or toes tingle, become numb, or turn cold and blue.  Do not take baths, swim, or use a hot tub until your health care provider approves. Ask your health care provider if you can take showers. You may only be allowed to take sponge baths for bathing.  Do not let your splint or brace get wet if it is not waterproof. ? If your splint or brace is not waterproof, cover it with a watertight plastic bag when you take a bath or a shower.  Keep the splint or brace clean. Managing pain, stiffness, and swelling  If directed, apply ice to the affected area. ? Put ice in a plastic bag. ? Place a towel between your skin and the bag. ? Leave the ice on for 20 minutes, 2-3 times a day.  If directed, apply heat to the affected area as often as told by your health care provider. Use the heat source that your health care provider recommends, such as a moist heat pack or a heating pad. ? Place a towel between your skin and the heat source. ? Leave the heat on for 20-30 minutes. ? Remove the heat if your skin turns bright red. This is especially important if you are unable to feel pain, heat, or cold. You may have a greater risk of getting burned.  Move the fingers or toes of the affected limb often, if this applies. This can help to prevent stiffness and  lessen swelling.  If directed, elevate the affected area above the level of your heart while you are sitting or lying down. Driving  Do not drive or operate heavy machinery while taking prescription pain medicine.  Ask your health care provider when it is safe to drive if you have a splint or brace on any part of your arm or leg. Activity  Return to your normal activities as told by your health care provider. Ask your health care provider what activities are safe for you.  Rest the affected area as told by your health care provider.  Avoid using the affected area while you are experiencing  symptoms of tendinitis.  Do exercises as told by your health care provider. General instructions  If you have a splint, do not put pressure on any part of the splint until it is fully hardened. This may take several hours.  Wear an elastic bandage or compression wrap only as told by your health care provider.  Take over-the-counter and prescription medicines only as told by your health care provider.  Keep all follow-up visits as told by your health care provider. This is important. Contact a health care provider if:  Your symptoms do not improve.  You develop new, unexplained problems, such as numbness in your hands. This information is not intended to replace advice given to you by your health care provider. Make sure you discuss any questions you have with your health care provider. Document Released: 07/01/2000 Document Revised: 03/03/2016 Document Reviewed: 04/06/2015 Elsevier Interactive Patient Education  [employer].

## 2017-06-11 ENCOUNTER — Telehealth: Payer: Self-pay | Admitting: Family Medicine

## 2017-06-11 DIAGNOSIS — E7800 Pure hypercholesterolemia, unspecified: Secondary | ICD-10-CM

## 2017-06-11 DIAGNOSIS — E559 Vitamin D deficiency, unspecified: Secondary | ICD-10-CM

## 2017-06-11 DIAGNOSIS — Z0000 Encounter for general adult medical examination without abnormal findings: Secondary | ICD-10-CM

## 2017-06-11 NOTE — Telephone Encounter (Signed)
-----   Message from Eustace Pen, LPN sent at 05/07/1172  4:51 PM EST ----- Regarding: Labs 11/26 Lab orders needed. Thank you.  Insurance:  [employer]

## 2017-06-12 ENCOUNTER — Ambulatory Visit (INDEPENDENT_AMBULATORY_CARE_PROVIDER_SITE_OTHER): Payer: Medicare Other

## 2017-06-12 VITALS — BP 110/70 | HR 72 | Temp 97.8°F | Ht 62.0 in | Wt 153.2 lb

## 2017-06-12 DIAGNOSIS — E7800 Pure hypercholesterolemia, unspecified: Secondary | ICD-10-CM | POA: Diagnosis not present

## 2017-06-12 DIAGNOSIS — E559 Vitamin D deficiency, unspecified: Secondary | ICD-10-CM | POA: Diagnosis not present

## 2017-06-12 DIAGNOSIS — Z0000 Encounter for general adult medical examination without abnormal findings: Secondary | ICD-10-CM

## 2017-06-12 LAB — LIPID PANEL
CHOL/HDL RATIO: 2
Cholesterol: 171 mg/dL (ref 0–200)
HDL: 70.1 mg/dL (ref >39.00)
LDL Cholesterol: 90 mg/dL (ref 0–99)
NonHDL: 101.22
TRIGLYCERIDES: 56 mg/dL (ref 0.0–149.0)
VLDL: 11.2 mg/dL (ref 0.0–40.0)

## 2017-06-12 LAB — COMPREHENSIVE METABOLIC PANEL
ALK PHOS: 83 U/L (ref 39–117)
ALT: 16 U/L (ref 0–35)
AST: 22 U/L (ref 0–37)
Albumin: 4 g/dL (ref 3.5–5.2)
BILIRUBIN TOTAL: 0.6 mg/dL (ref 0.2–1.2)
BUN: 12 mg/dL (ref 6–23)
CO2: 28 mEq/L (ref 19–32)
Calcium: 9.7 mg/dL (ref 8.4–10.5)
Chloride: 106 mEq/L (ref 96–112)
Creatinine, Ser: 0.94 mg/dL (ref 0.40–1.20)
GFR: 62.39 mL/min (ref >60.00)
Glucose, Bld: 100 mg/dL — ABNORMAL HIGH (ref 70–99)
POTASSIUM: 4.2 meq/L (ref 3.5–5.1)
Sodium: 139 mEq/L (ref 135–145)
TOTAL PROTEIN: 6.6 g/dL (ref 6.0–8.3)

## 2017-06-12 LAB — CBC WITH DIFFERENTIAL/PLATELET
BASOS ABS: 0 10*3/uL (ref 0.0–0.1)
Basophils Relative: 1 % (ref 0.0–3.0)
EOS PCT: 6.4 % — AB (ref 0.0–5.0)
Eosinophils Absolute: 0.3 10*3/uL (ref 0.0–0.7)
HEMATOCRIT: 40.5 % (ref 36.0–46.0)
HEMOGLOBIN: 13.3 g/dL (ref 12.0–15.0)
Lymphocytes Relative: 43.1 % (ref 12.0–46.0)
Lymphs Abs: 2 10*3/uL (ref 0.7–4.0)
MCHC: 32.9 g/dL (ref 30.0–36.0)
MCV: 95.3 fl (ref 78.0–100.0)
MONOS PCT: 7.7 % (ref 3.0–12.0)
Monocytes Absolute: 0.4 10*3/uL (ref 0.1–1.0)
NEUTROS PCT: 41.8 % — AB (ref 43.0–77.0)
Neutro Abs: 2 10*3/uL (ref 1.4–7.7)
Platelets: 305 10*3/uL (ref 150.0–400.0)
RBC: 4.25 Mil/uL (ref 3.87–5.11)
RDW: 12.8 % (ref 11.5–15.5)
WBC: 4.7 10*3/uL (ref 4.0–10.5)

## 2017-06-12 LAB — VITAMIN D 25 HYDROXY (VIT D DEFICIENCY, FRACTURES): VITD: 34.71 ng/mL (ref 30.00–100.00)

## 2017-06-12 LAB — TSH: TSH: 2.13 u[IU]/mL (ref 0.35–4.50)

## 2017-06-12 NOTE — Progress Notes (Signed)
Subjective:   Diane Delacruz is a 71 y.o. female who presents for Medicare Annual (Subsequent) preventive examination.  Review of Systems:  N/A Cardiac Risk Factors include: advanced age (>34men, >46 women);dyslipidemia     Objective:     Vitals: BP 110/70 (BP Location: Right Arm, Patient Position: Sitting, Cuff Size: Normal)   Pulse 72   Temp 97.8 F (36.6 C) (Oral)   Ht 5\' 2"  (1.575 m) Comment: no shoes  Wt 153 lb 4 oz (69.5 kg)   LMP 07/19/1995   SpO2 95%   BMI 28.03 kg/m   Body mass index is 28.03 kg/m.   Tobacco Social History   Tobacco Use  Smoking Status Never Smoker  Smokeless Tobacco Never Used     Counseling given: No   Past Medical History:  Diagnosis Date  . Allergic rhinitis due to pollen   . Asymptomatic varicose veins   . Bladder tumor 11/05  . CAD (coronary artery disease)   . DDD (degenerative disc disease)    in neck, neurosurgeon Dr. Trenton Gammon  . Depression   . Diffuse cystic mastopathy   . Diverticulosis   . Dyspnea 2008   negative echo and MV scan  . Esophageal reflux   . History of gallstones   . Hypoglycemia, unspecified   . Irritable bowel syndrome   . Lumbago   . Palpitations 03/06/09   event monitor  . Pure hypercholesterolemia   . Stress fracture of foot 03/28/99   left  . Symptomatic menopausal or female climacteric states   . Unspecified adverse effect of other drug, medicinal and biological substance(995.29)   . Unspecified vitamin D deficiency    Past Surgical History:  Procedure Laterality Date  . BLADDER SURGERY     tumor removed  . CHOLECYSTECTOMY     gallstones- ultrasound 10/03   Family History  Problem Relation Age of Onset  . Hyperlipidemia Mother   . Stroke Mother   . Arthritis Mother   . Macular degeneration Mother   . Irritable bowel syndrome Mother   . Colon cancer Father   . Heart disease Father        MI's  . COPD Sister        heavy smoker  . Colon polyps Maternal Aunt   . Breast cancer  Paternal Aunt   . Osteopenia Sister   . Other Daughter        celiac spruce  . Diabetes Brother   . Diabetes Paternal Uncle   . Irritable bowel syndrome Sister   . Kidney disease Cousin   . Rectal cancer Neg Hx   . Stomach cancer Neg Hx    Social History   Substance and Sexual Activity  Sexual Activity No    Outpatient Encounter Medications as of 06/12/2017  Medication Sig  . acetaminophen (TYLENOL) 500 MG tablet Take 1,000 mg by mouth every 6 (six) hours as needed for mild pain.   . Calcium-Magnesium-Vitamin D (CALCIUM 1200+D3 PO) Take 1 tablet by mouth daily.  Marland Kitchen esomeprazole (NEXIUM) 40 MG capsule Take 1 capsule (40 mg total) by mouth daily at 12 noon.  . fexofenadine (ALLEGRA) 60 MG tablet Take 180 mg by mouth daily as needed for allergies.   . mometasone (NASONEX) 50 MCG/ACT nasal spray Place 2 sprays into the nose daily as needed (allergies).  . pravastatin (PRAVACHOL) 20 MG tablet Take 1 tablet (20 mg total) by mouth daily.  . [DISCONTINUED] predniSONE (DELTASONE) 10 MG tablet Take four tablets for 2  days, then three tablets for 2 days, then two tablet for 2 days, then one tablet for 2 days.   No facility-administered encounter medications on file as of 06/12/2017.     Activities of Daily Living In your present state of health, do you have any difficulty performing the following activities: 06/12/2017  Hearing? N  Vision? N  Difficulty concentrating or making decisions? N  Walking or climbing stairs? N  Dressing or bathing? N  Doing errands, shopping? N  Preparing Food and eating ? N  Using the Toilet? N  In the past six months, have you accidently leaked urine? N  Do you have problems with loss of bowel control? N  Managing your Medications? N  Managing your Finances? N  Housekeeping or managing your Housekeeping? N  Some recent data might be hidden    Patient Care Team: Tower, Wynelle Fanny, MD as PCP - General Thelma Comp, [place] as Consulting Physician  (Optometry) Sydnee Levans, MD as Consulting Physician (Dermatology)    Assessment:     Hearing Screening   125Hz  250Hz  500Hz  1000Hz  2000Hz  3000Hz  4000Hz  6000Hz  8000Hz   Right ear:   40 40 40  40    Left ear:   40 40 40  40    Vision Screening Comments: Future appt scheduled 06/15/17 with Dr. Maryruth Hancock B.   Exercise Activities and Dietary recommendations Current Exercise Habits: The patient does not participate in regular exercise at present, Exercise limited by: None identified  Goals    . Increase physical activity     Within the next week, I will attempt to exercise for at least 60 minutes 3 days per week.       Fall Risk Fall Risk  06/12/2017 05/31/2016 08/18/2015 04/27/2015 04/25/2014  Falls in the past year? No No No No No   Depression Screen PHQ 2/9 Scores 06/12/2017 05/31/2016 04/27/2015 04/25/2014  PHQ - 2 Score 0 0 0 0  PHQ- 9 Score 0 - - -     Cognitive Function MMSE - Mini Mental State Exam 06/12/2017 05/31/2016  Orientation to time 5 5  Orientation to Place 5 5  Registration 3 3  Attention/ Calculation 0 0  Recall 3 3  Language- name 2 objects 0 0  Language- repeat 1 1  Language- follow 3 step command 3 3  Language- read & follow direction 0 0  Write a sentence 0 0  Copy design 0 0  Total score 20 20       PLEASE NOTE: A Mini-Cog screen was completed. Maximum score is 20. A value of 0 denotes this part of Folstein MMSE was not completed or the patient failed this part of the Mini-Cog screening.   Mini-Cog Screening Orientation to Time - Max 5 pts Orientation to Place - Max 5 pts Registration - Max 3 pts Recall - Max 3 pts Language Repeat - Max 1 pts Language Follow 3 Step Command - Max 3 pts   Immunization History  Administered Date(s) Administered  . Influenza Split 04/26/2011  . Influenza,inj,Quad PF,6+ Mos 04/25/2014, 04/27/2015, 05/02/2017  . Influenza-Unspecified 04/27/2016  . Pneumococcal Conjugate-13 04/25/2014  . Pneumococcal  Polysaccharide-23 11/02/2011  . Td 11/11/2002   Screening Tests Health Maintenance  Topic Date Due  . MAMMOGRAM  06/12/2018 (Originally 05/19/2017)  . COLONOSCOPY  06/12/2018 (Originally 12/19/2016)  . TETANUS/TDAP  08/20/2023 (Originally 11/10/2012)  . INFLUENZA VACCINE  Completed  . DEXA SCAN  Completed  . Hepatitis C Screening  Completed  . PNA vac Low  Risk Adult  Completed      Plan:   I have personally reviewed, addressed, and noted the following in the patient's chart:  A. Medical and social history B. Use of alcohol, tobacco or illicit drugs  C. Current medications and supplements D. Functional ability and status E.  Nutritional status F.  Physical activity G. Advance directives H. List of other physicians I.  Hospitalizations, surgeries, and ER visits in previous 12 months J.  [place] to include hearing, vision, cognitive, depression L. Referrals and appointments - none  In addition, I have reviewed and discussed with patient certain preventive protocols, quality metrics, and best practice recommendations. A written personalized care plan for preventive services as well as general preventive health recommendations were provided to patient.  See attached scanned questionnaire for additional information.   Signed,   Lindell Noe, MHA, BS, LPN Health Coach

## 2017-06-12 NOTE — Progress Notes (Signed)
PCP notes:   Health maintenance:  Mammogram - addressed; pt plans to schedule future appt Colonoscopy - addressed; pt plans to schedule future appt  Abnormal screenings:   None  Patient concerns:   None  Nurse concerns:  None  Next PCP appt:   06/21/17 @ 1130

## 2017-06-12 NOTE — Patient Instructions (Addendum)
Ms. Ridinger , Thank you for taking time to come for your Medicare Wellness Visit. I appreciate your ongoing commitment to your health goals. Please review the following plan we discussed and let me know if I can assist you in the future.   These are the goals we discussed: Goals    . Increase physical activity     Within the next week, I will attempt to exercise for at least 60 minutes 3 days per week.        This is a list of the screening recommended for you and due dates:  Health Maintenance  Topic Date Due  . Mammogram  06/12/2018*  . Colon Cancer Screening  06/12/2018*  . Tetanus Vaccine  08/20/2023*  . Flu Shot  Completed  . DEXA scan (bone density measurement)  Completed  .  Hepatitis C: One time screening is recommended by Center for Disease Control  (CDC) for  adults born from [birth year] through 1965.   Completed  . Pneumonia vaccines  Completed  *Topic was postponed. The date shown is not the original due date.   Preventive Care for Adults  A healthy lifestyle and preventive care can promote health and wellness. Preventive health guidelines for adults include the following key practices.  . A routine yearly physical is a good way to check with your health care provider about your health and preventive screening. It is a chance to share any concerns and updates on your health and to receive a thorough exam.  . Visit your dentist for a routine exam and preventive care every 6 months. Brush your teeth twice a day and floss once a day. Good oral hygiene prevents tooth decay and gum disease.  . The frequency of eye exams is based on your age, health, family medical history, use  of contact lenses, and other factors. Follow your health care provider's recommendations for frequency of eye exams.  . Eat a healthy diet. Foods like vegetables, fruits, whole grains, low-fat dairy products, and lean protein foods contain the nutrients you need without too many calories. Decrease your  intake of foods high in solid fats, added sugars, and salt. Eat the right amount of calories for you. Get information about a proper diet from your health care provider, if necessary.  . Regular physical exercise is one of the most important things you can do for your health. Most adults should get at least 150 minutes of moderate-intensity exercise (any activity that increases your heart rate and causes you to sweat) each week. In addition, most adults need muscle-strengthening exercises on 2 or more days a week.  Silver Sneakers may be a benefit available to you. To determine eligibility, you may visit the website: www.silversneakers.com or contact program at 4152123319 Mon-Fri between 8AM-8PM.   . Maintain a healthy weight. The body mass index (BMI) is a screening tool to identify possible weight problems. It provides an estimate of body fat based on height and weight. Your health care provider can find your BMI and can help you achieve or maintain a healthy weight.   For adults 20 years and older: ? A BMI below 18.5 is considered underweight. ? A BMI of 18.5 to 24.9 is normal. ? A BMI of 25 to 29.9 is considered overweight. ? A BMI of 30 and above is considered obese.   . Maintain normal blood lipids and cholesterol levels by exercising and minimizing your intake of saturated fat. Eat a balanced diet with plenty of fruit and vegetables.  Blood tests for lipids and cholesterol should begin at age 33 and be repeated every 5 years. If your lipid or cholesterol levels are high, you are over 50, or you are at high risk for heart disease, you may need your cholesterol levels checked more frequently. Ongoing high lipid and cholesterol levels should be treated with medicines if diet and exercise are not working.  . If you smoke, find out from your health care provider how to quit. If you do not use tobacco, please do not start.  . If you choose to drink alcohol, please do not consume more than 2  drinks per day. One drink is considered to be 12 ounces (355 mL) of beer, 5 ounces (148 mL) of wine, or 1.5 ounces (44 mL) of liquor.  . If you are 67-50 years old, ask your health care provider if you should take aspirin to prevent strokes.  . Use sunscreen. Apply sunscreen liberally and repeatedly throughout the day. You should seek shade when your shadow is shorter than you. Protect yourself by wearing long sleeves, pants, a wide-brimmed hat, and sunglasses year round, whenever you are outdoors.  . Once a month, do a whole body skin exam, using a mirror to look at the skin on your back. Tell your health care provider of new moles, moles that have irregular borders, moles that are larger than a pencil eraser, or moles that have changed in shape or color.

## 2017-06-12 NOTE — Progress Notes (Signed)
Pre visit review using our clinic review tool, if applicable. No additional management support is needed unless otherwise documented below in the visit note. 

## 2017-06-13 NOTE — Progress Notes (Signed)
I reviewed health advisor's note, was available for consultation, and agree with documentation and plan.  

## 2017-06-16 ENCOUNTER — Other Ambulatory Visit: Payer: Self-pay

## 2017-06-16 ENCOUNTER — Encounter: Payer: Self-pay | Admitting: Family Medicine

## 2017-06-16 ENCOUNTER — Ambulatory Visit: Payer: Medicare Other | Admitting: Family Medicine

## 2017-06-16 DIAGNOSIS — J069 Acute upper respiratory infection, unspecified: Secondary | ICD-10-CM

## 2017-06-16 DIAGNOSIS — B9789 Other viral agents as the cause of diseases classified elsewhere: Secondary | ICD-10-CM

## 2017-06-16 MED ORDER — AZITHROMYCIN 250 MG PO TABS: ORAL_TABLET | ORAL | 0 refills | Status: DC

## 2017-06-16 MED ORDER — HYDROCODONE-HOMATROPINE 5-1.5 MG/5ML PO SYRP: 5.0000 mL | ORAL_SOLUTION | Freq: Every evening | ORAL | 0 refills | Status: DC | PRN

## 2017-06-16 NOTE — Assessment & Plan Note (Signed)
Symptomatic care but given length of illness if  Not turing the corner can fill antibiotics for possible  Bacterial source of cough.

## 2017-06-16 NOTE — Progress Notes (Signed)
   Subjective:    Patient ID: Diane Delacruz, female    DOB: 09/17/1945, 71 y.o.   MRN: [id number]  Cough  This is a new problem. The current episode started 1 to 4 weeks ago (2 weeks). The problem has been gradually worsening. The cough is productive of sputum. Associated symptoms include ear congestion, nasal congestion and postnasal drip. Pertinent negatives include no chills, ear pain, fever, myalgias, sore throat, shortness of breath or wheezing. The symptoms are aggravated by lying down. Risk factors: nonsmoker. She has tried prescription cough suppressant (also using dayquil) for the symptoms. The treatment provided moderate relief. There is no history of asthma, bronchitis, COPD, environmental allergies or pneumonia.    Blood pressure 110/60, pulse 69, temperature 98.2 F (36.8 C), temperature source Oral, height 5\' 2"  (1.575 m), weight 156 lb (70.8 kg), last menstrual period 07/19/1995.   Review of Systems  Constitutional: Negative for chills and fever.  HENT: Positive for postnasal drip. Negative for ear pain and sore throat.   Respiratory: Positive for cough. Negative for shortness of breath and wheezing.   Musculoskeletal: Negative for myalgias.  Allergic/Immunologic: Negative for environmental allergies.       Objective:   Physical Exam        Assessment & Plan:

## 2017-06-16 NOTE — Patient Instructions (Signed)
Use cough suppressant at night.  rest, fluids.  Can use mucinex DM during the day.  If not turning the corner in next 5-7  Or new fever, increased shortness of breath ...can fill rx for antibiotics.

## 2017-06-21 ENCOUNTER — Encounter: Payer: Self-pay | Admitting: Gastroenterology

## 2017-06-21 ENCOUNTER — Ambulatory Visit (INDEPENDENT_AMBULATORY_CARE_PROVIDER_SITE_OTHER): Payer: Medicare Other | Admitting: Family Medicine

## 2017-06-21 ENCOUNTER — Encounter: Payer: Self-pay | Admitting: Family Medicine

## 2017-06-21 VITALS — BP 128/68 | HR 67 | Temp 98.0°F | Ht 62.0 in | Wt 154.5 lb

## 2017-06-21 DIAGNOSIS — E2839 Other primary ovarian failure: Secondary | ICD-10-CM | POA: Diagnosis not present

## 2017-06-21 DIAGNOSIS — E7800 Pure hypercholesterolemia, unspecified: Secondary | ICD-10-CM | POA: Diagnosis not present

## 2017-06-21 DIAGNOSIS — M8589 Other specified disorders of bone density and structure, multiple sites: Secondary | ICD-10-CM | POA: Diagnosis not present

## 2017-06-21 DIAGNOSIS — E559 Vitamin D deficiency, unspecified: Secondary | ICD-10-CM

## 2017-06-21 DIAGNOSIS — Z1231 Encounter for screening mammogram for malignant neoplasm of breast: Secondary | ICD-10-CM | POA: Diagnosis not present

## 2017-06-21 DIAGNOSIS — R7309 Other abnormal glucose: Secondary | ICD-10-CM | POA: Insufficient documentation

## 2017-06-21 DIAGNOSIS — M8580 Other specified disorders of bone density and structure, unspecified site: Secondary | ICD-10-CM

## 2017-06-21 DIAGNOSIS — Z800 Family history of malignant neoplasm of digestive organs: Secondary | ICD-10-CM | POA: Diagnosis not present

## 2017-06-21 DIAGNOSIS — Z0000 Encounter for general adult medical examination without abnormal findings: Secondary | ICD-10-CM | POA: Diagnosis not present

## 2017-06-21 MED ORDER — ESOMEPRAZOLE MAGNESIUM 40 MG PO CPDR: 40.0000 mg | DELAYED_RELEASE_CAPSULE | Freq: Every day | ORAL | 11 refills | Status: DC

## 2017-06-21 MED ORDER — MOMETASONE FUROATE 50 MCG/ACT NA SUSP: 2.0000 | Freq: Every day | NASAL | 11 refills | Status: AC | PRN

## 2017-06-21 MED ORDER — PRAVASTATIN SODIUM 20 MG PO TABS: 20.0000 mg | ORAL_TABLET | Freq: Every day | ORAL | 3 refills | Status: DC

## 2017-06-21 NOTE — Progress Notes (Signed)
Subjective:    Patient ID: Diane Delacruz, female    DOB: Apr 27, 1946, 71 y.o.   MRN: [id number]  HPI Here for health maintenance exam and to review chronic medical problems    Better from her uri  Busy for the holidays  Had amw on 11/26 No concerns Addressed gaps in screening   Wt Readings from Last 3 Encounters:  06/21/17 154 lb 8 oz (70.1 kg)  06/16/17 156 lb (70.8 kg)  06/12/17 153 lb 4 oz (69.5 kg)  28.26 kg/m    Mammogram 11/16- due for one / wants [place] to set it up  Self breast exam - no lumps   Colonoscopy 6/13- 5 y recall due to family hx of colon cancer in father Needs to set that up with [place]   Tetanus vaccine 4/04  dexa 11/16-osteopenia -will set that up  Vit D level is 34.7- she is taking 1000 iu daily  utd imms   Zoster status -interested in Shingrix if avail and affordable  Has had shingles twice   Hyperlipidemia Lab Results  Component Value Date   CHOL 171 06/12/2017   CHOL 169 05/20/2016   CHOL 178 04/20/2015   Lab Results  Component Value Date   HDL 70.10 06/12/2017   HDL 73.40 05/20/2016   HDL 71.90 04/20/2015   Lab Results  Component Value Date   LDLCALC 90 06/12/2017   LDLCALC 78 05/20/2016   LDLCALC 90 04/20/2015   Lab Results  Component Value Date   TRIG 56.0 06/12/2017   TRIG 86.0 05/20/2016   TRIG 79.0 04/20/2015   Lab Results  Component Value Date   CHOLHDL 2 06/12/2017   CHOLHDL 2 05/20/2016   CHOLHDL 2 04/20/2015   Lab Results  Component Value Date   LDLDIRECT 170.5 04/30/2012   LDLDIRECT 144.6 10/31/2011   LDLDIRECT 137.0 12/01/2010   On pravastatin and diet  Eating fairly healthy most of the time  This was after thanksgiving   Other labs: Results for orders placed or performed in visit on 06/12/17  VITAMIN D 25 Hydroxy (Vit-D Deficiency, Fractures)  Result Value Ref Range   VITD 34.71 30.00 - 100.00 ng/mL  TSH  Result Value Ref Range   TSH 2.13 0.35 - 4.50 uIU/mL  Lipid panel  Result Value Ref  Range   Cholesterol 171 0 - 200 mg/dL   Triglycerides 56.0 0.0 - 149.0 mg/dL   HDL 70.10 >39.00 mg/dL   VLDL 11.2 0.0 - 40.0 mg/dL   LDL Cholesterol 90 0 - 99 mg/dL   Total CHOL/HDL Ratio 2    NonHDL 101.22   Comprehensive metabolic panel  Result Value Ref Range   Sodium 139 135 - 145 mEq/L   Potassium 4.2 3.5 - 5.1 mEq/L   Chloride 106 96 - 112 mEq/L   CO2 28 19 - 32 mEq/L   Glucose, Bld 100 (H) 70 - 99 mg/dL   BUN 12 6 - 23 mg/dL   Creatinine, Ser 0.94 0.40 - 1.20 mg/dL   Total Bilirubin 0.6 0.2 - 1.2 mg/dL   Alkaline Phosphatase 83 39 - 117 U/L   AST 22 0 - 37 U/L   ALT 16 0 - 35 U/L   Total Protein 6.6 6.0 - 8.3 g/dL   Albumin 4.0 3.5 - 5.2 g/dL   Calcium 9.7 8.4 - 10.5 mg/dL   GFR 62.39 >60.00 mL/min  CBC with Differential/Platelet  Result Value Ref Range   WBC 4.7 4.0 - 10.5 K/uL   RBC  4.25 3.87 - 5.11 Mil/uL   Hemoglobin 13.3 12.0 - 15.0 g/dL   HCT 40.5 36.0 - 46.0 %   MCV 95.3 78.0 - 100.0 fl   MCHC 32.9 30.0 - 36.0 g/dL   RDW 12.8 11.5 - 15.5 %   Platelets 305.0 150.0 - 400.0 K/uL   Neutrophils Relative % 41.8 (L) 43.0 - 77.0 %   Lymphocytes Relative 43.1 12.0 - 46.0 %   Monocytes Relative 7.7 3.0 - 12.0 %   Eosinophils Relative 6.4 (H) 0.0 - 5.0 %   Basophils Relative 1.0 0.0 - 3.0 %   Neutro Abs 2.0 1.4 - 7.7 K/uL   Lymphs Abs 2.0 0.7 - 4.0 K/uL   Monocytes Absolute 0.4 0.1 - 1.0 K/uL   Eosinophils Absolute 0.3 0.0 - 0.7 K/uL   Basophils Absolute 0.0 0.0 - 0.1 K/uL    Patient Active Problem List   Diagnosis Date Noted  . Encounter for screening mammogram for breast cancer 06/21/2017  . Elevated glucose level 06/21/2017  . Viral URI with cough 06/16/2017  . Need for hepatitis C screening test 05/24/2016  . New onset of headaches after age 44 07/22/2015  . Estrogen deficiency 04/27/2015  . Routine general medical examination at a health care facility 04/19/2015  . Osteopenia 04/25/2014  . Encounter for Medicare annual wellness exam 11/07/2012  . TMJ  (temporomandibular joint syndrome) 02/24/2012  . Insomnia 02/21/2011  . Family history of colon cancer 11/01/2010  . Hyperlipidemia 11/01/2010  . Vitamin D deficiency 10/10/2008  . IRRITABLE BOWEL SYNDROME 10/15/2007  . VARICOSE VEINS, LOWER EXTREMITIES 08/15/2007  . Disorder of bone and cartilage 05/14/2007  . DEPRESSION 05/11/2007  . ALLERGIC RHINITIS, SEASONAL 05/11/2007  . GERD 05/11/2007  . FIBROCYSTIC BREAST DISEASE 05/11/2007  . POSTMENOPAUSAL STATUS 05/11/2007   Past Medical History:  Diagnosis Date  . Allergic rhinitis due to pollen   . Asymptomatic varicose veins   . Bladder tumor 11/05  . CAD (coronary artery disease)   . DDD (degenerative disc disease)    in neck, neurosurgeon Dr. Trenton Gammon  . Depression   . Diffuse cystic mastopathy   . Diverticulosis   . Dyspnea 2008   negative echo and MV scan  . Esophageal reflux   . History of gallstones   . Hypoglycemia, unspecified   . Irritable bowel syndrome   . Lumbago   . Palpitations 03/06/09   event monitor  . Pure hypercholesterolemia   . Stress fracture of foot 03/28/99   left  . Symptomatic menopausal or female climacteric states   . Unspecified adverse effect of other drug, medicinal and biological substance(995.29)   . Unspecified vitamin D deficiency    Past Surgical History:  Procedure Laterality Date  . BLADDER SURGERY     tumor removed  . CHOLECYSTECTOMY     gallstones- ultrasound 10/03   Social History   Tobacco Use  . Smoking status: Never Smoker  . Smokeless tobacco: Never Used  Substance Use Topics  . Alcohol use: Yes    Alcohol/week: 0.0 oz    Comment: Rare-wine  . Drug use: No   Family History  Problem Relation Age of Onset  . Hyperlipidemia Mother   . Stroke Mother   . Arthritis Mother   . Macular degeneration Mother   . Irritable bowel syndrome Mother   . Colon cancer Father   . Heart disease Father        MI's  . COPD Sister        heavy  smoker  . Colon polyps Maternal Aunt     . Breast cancer Paternal Aunt   . Osteopenia Sister   . Other Daughter        celiac spruce  . Diabetes Brother   . Diabetes Paternal Uncle   . Irritable bowel syndrome Sister   . Kidney disease Cousin   . Rectal cancer Neg Hx   . Stomach cancer Neg Hx    Allergies  Allergen Reactions  . Amoxicillin     Rash and diarrhea   . Codeine     REACTION: unknown reaction  . Flexeril [Cyclobenzaprine]     sedation  . Klonopin [Clonazepam]     Dizziness , fatigue  . Nsaids     REACTION: diarrhea  . Omeprazole     REACTION: did not work for her  . Pseudoephedrine     REACTION: jittery   Current Outpatient Medications on File Prior to Visit  Medication Sig Dispense Refill  . acetaminophen (TYLENOL) 500 MG tablet Take 1,000 mg by mouth every 6 (six) hours as needed for mild pain.     Marland Kitchen azithromycin (ZITHROMAX) 250 MG tablet 2 tab po x 1 day then 1 tab po daily 6 tablet 0  . Calcium-Magnesium-Vitamin D (CALCIUM 1200+D3 PO) Take 1 tablet by mouth daily.    Marland Kitchen esomeprazole (NEXIUM) 40 MG capsule Take 1 capsule (40 mg total) by mouth daily at 12 noon. 30 capsule 11  . fexofenadine (ALLEGRA) 60 MG tablet Take 180 mg by mouth daily as needed for allergies.     Marland Kitchen HYDROcodone-homatropine (HYCODAN) 5-1.5 MG/5ML syrup Take 5 mLs by mouth at bedtime as needed for cough. 120 mL 0  . mometasone (NASONEX) 50 MCG/ACT nasal spray Place 2 sprays into the nose daily as needed (allergies). 17 g 11  . pravastatin (PRAVACHOL) 20 MG tablet Take 1 tablet (20 mg total) by mouth daily. 90 tablet 3   No current facility-administered medications on file prior to visit.      Review of Systems  Constitutional: Negative for activity change, appetite change, fatigue, fever and unexpected weight change.  HENT: Negative for congestion, ear pain, rhinorrhea, sinus pressure and sore throat.   Eyes: Negative for pain, redness and visual disturbance.  Respiratory: Negative for cough, shortness of breath and wheezing.    Cardiovascular: Negative for chest pain and palpitations.  Gastrointestinal: Negative for abdominal pain, blood in stool, constipation and diarrhea.  Endocrine: Negative for polydipsia and polyuria.  Genitourinary: Negative for dysuria, frequency and urgency.  Musculoskeletal: Negative for arthralgias, back pain and myalgias.  Skin: Negative for pallor and rash.  Allergic/Immunologic: Negative for environmental allergies.  Neurological: Negative for dizziness, syncope and headaches.  Hematological: Negative for adenopathy. Does not bruise/bleed easily.  Psychiatric/Behavioral: Negative for decreased concentration and dysphoric mood. The patient is not nervous/anxious.        Objective:   Physical Exam  Constitutional: She appears well-developed and well-nourished. No distress.  overwt and well app   HENT:  Head: Normocephalic and atraumatic.  Right Ear: External ear normal.  Left Ear: External ear normal.  Nose: Nose normal.  Mouth/Throat: Oropharynx is clear and moist.  Eyes: Conjunctivae and EOM are normal. Pupils are equal, round, and reactive to light. Right eye exhibits no discharge. Left eye exhibits no discharge. No scleral icterus.  Neck: Normal range of motion. Neck supple. No JVD present. Carotid bruit is not present. No thyromegaly present.  Cardiovascular: Normal rate, regular rhythm, normal heart sounds and intact  distal pulses. Exam reveals no gallop.  Pulmonary/Chest: Effort normal and breath sounds normal. No respiratory distress. She has no wheezes. She has no rales.  Abdominal: Soft. Bowel sounds are normal. She exhibits no distension and no mass. There is no tenderness.  Genitourinary:  Genitourinary Comments: Breast exam: No mass, nodules, thickening, tenderness, bulging, retraction, inflamation, nipple discharge or skin changes noted.  No axillary or clavicular LA.      Musculoskeletal: She exhibits no edema or tenderness.  No kyphosis   Lymphadenopathy:    She  has no cervical adenopathy.  Neurological: She is alert. She has normal reflexes. No cranial nerve deficit. She exhibits normal muscle tone. Coordination normal.  Skin: Skin is warm and dry. No rash noted. No erythema. No pallor.  Solar lentigines diffusely   Psychiatric: She has a normal mood and affect.          Assessment & Plan:   Problem List Items Addressed This Visit      Musculoskeletal and Integument   Osteopenia    No falls or fx Due for dexa- ordered  Rev intake of ca and D  Will inc D to 2000 iu daily for level in the 85s        RESOLVED: Osteopenia     Other   Elevated glucose level    Glucose upper normal level of 100  Will continue to watch this disc imp of low glycemic diet and wt loss to prevent DM2       Encounter for screening mammogram for breast cancer    Scheduled annual screening mammogram Nl breast exam today  Encouraged monthly self exams        Relevant Orders   MM DIGITAL SCREENING BILATERAL   Estrogen deficiency   Relevant Orders   DG Bone Density   Family history of colon cancer    Ref to GI for 5 y recall colonoscopy      Relevant Orders   Ambulatory referral to Gastroenterology   Hyperlipidemia    Disc goals for lipids and reasons to control them Rev labs with pt Rev low sat fat diet in detail Continue pravastatin and diet       Relevant Medications   pravastatin (PRAVACHOL) 20 MG tablet   Routine general medical examination at a health care facility - Primary    Reviewed health habits including diet and exercise and skin cancer prevention Reviewed appropriate screening tests for age  Also reviewed health mt list, fam hx and immunization status , as well as social and family history   See HPI AMW reviewed  Labs reviewed Ordered mammogram and dexa  Ref to GI for recall colonoscopy  Enc good health habits       Vitamin D deficiency    Level in 106s Inc to 2000 iu daily  Disc imp to bone and overall health

## 2017-06-21 NOTE — Patient Instructions (Addendum)
See Rosaria Ferries on the way out for referrals   Add another 1000 iu of vitamin D daily to what you are already taking   Check to see if a tetanus shot is covered in your pharmacy- you are due   The new shingles vaccine is called Shingrix- also check with your pharmacy about that and coverage   Watch your sweets/sugar/sweet drinks for blood sugar  Try to get most of your carbohydrates from produce (with the exception of white potatoes)  Eat less bread/pasta/rice/snack foods/cereals/sweets and other items from the middle of the grocery store (processed carbs)

## 2017-06-22 NOTE — Assessment & Plan Note (Signed)
No falls or fx Due for dexa- ordered  Rev intake of ca and D  Will inc D to 2000 iu daily for level in the 50s

## 2017-06-22 NOTE — Assessment & Plan Note (Signed)
Scheduled annual screening mammogram Nl breast exam today  Encouraged monthly self exams   

## 2017-06-22 NOTE — Assessment & Plan Note (Signed)
Ref to GI for 5 y recall colonoscopy

## 2017-06-22 NOTE — Assessment & Plan Note (Signed)
Disc goals for lipids and reasons to control them Rev labs with pt Rev low sat fat diet in detail Continue pravastatin and diet  

## 2017-06-22 NOTE — Assessment & Plan Note (Signed)
Glucose upper normal level of 100  Will continue to watch this disc imp of low glycemic diet and wt loss to prevent DM2

## 2017-06-22 NOTE — Assessment & Plan Note (Signed)
Level in 30s Inc to 2000 iu daily  Disc imp to bone and overall health

## 2017-06-22 NOTE — Assessment & Plan Note (Signed)
Reviewed health habits including diet and exercise and skin cancer prevention Reviewed appropriate screening tests for age  Also reviewed health mt list, fam hx and immunization status , as well as social and family history   See HPI AMW reviewed  Labs reviewed Ordered mammogram and dexa  Ref to GI for recall colonoscopy  Enc good health habits

## 2017-06-30 ENCOUNTER — Other Ambulatory Visit: Payer: Self-pay | Admitting: Family Medicine

## 2017-06-30 DIAGNOSIS — Z1231 Encounter for screening mammogram for malignant neoplasm of breast: Secondary | ICD-10-CM

## 2017-07-21 DIAGNOSIS — H524 Presbyopia: Secondary | ICD-10-CM | POA: Diagnosis not present

## 2017-07-31 ENCOUNTER — Ambulatory Visit
Admission: RE | Admit: 2017-07-31 | Discharge: 2017-07-31 | Disposition: A | Discharge status: Home / Self Care | Payer: Medicare Other | Source: Ambulatory Visit | Attending: Family Medicine | Admitting: Family Medicine

## 2017-07-31 ENCOUNTER — Encounter: Payer: Self-pay | Admitting: Family Medicine

## 2017-07-31 DIAGNOSIS — E2839 Other primary ovarian failure: Secondary | ICD-10-CM

## 2017-07-31 DIAGNOSIS — Z1231 Encounter for screening mammogram for malignant neoplasm of breast: Secondary | ICD-10-CM

## 2017-07-31 DIAGNOSIS — M810 Age-related osteoporosis without current pathological fracture: Secondary | ICD-10-CM | POA: Diagnosis not present

## 2017-07-31 DIAGNOSIS — Z780 Asymptomatic menopausal state: Secondary | ICD-10-CM | POA: Diagnosis not present

## 2017-08-02 ENCOUNTER — Encounter: Payer: Self-pay | Admitting: Family Medicine

## 2017-08-02 ENCOUNTER — Ambulatory Visit (INDEPENDENT_AMBULATORY_CARE_PROVIDER_SITE_OTHER)
Admission: RE | Admit: 2017-08-02 | Discharge: 2017-08-02 | Disposition: A | Discharge status: Home / Self Care | Payer: Medicare Other | Source: Ambulatory Visit | Attending: Family Medicine | Admitting: Family Medicine

## 2017-08-02 ENCOUNTER — Ambulatory Visit: Payer: Medicare Other | Admitting: Family Medicine

## 2017-08-02 ENCOUNTER — Encounter: Payer: Medicare Other | Admitting: Gastroenterology

## 2017-08-02 VITALS — BP 118/72 | HR 74 | Temp 98.5°F | Wt 154.0 lb

## 2017-08-02 DIAGNOSIS — R05 Cough: Secondary | ICD-10-CM

## 2017-08-02 DIAGNOSIS — R059 Cough, unspecified: Secondary | ICD-10-CM

## 2017-08-02 MED ORDER — BENZONATATE 200 MG PO CAPS: 200.0000 mg | ORAL_CAPSULE | Freq: Three times a day (TID) | ORAL | 1 refills | Status: DC | PRN

## 2017-08-02 NOTE — Progress Notes (Signed)
Cough to the point of vomiting.  Had been coughing since 05/2017.  Prev treated with abx and cough medicine.  She was getting some better but then the sx would flare back up.  She had chest congestion and the feeling of something in her chest.  Some sputum some of the time but not always.  Can be a dry hacking cough.  She never had full resolution since 05/2017, does have some waxing and waning.  No fevers.  She has some chills and fatigue.  No facial or ear pain.  No ST, but occ scratchy from coughing so much.  Tried OTC cough meds.  No wheeze.  Not SOB, no orthopnea.  No h/o COPD, asthma.    No h/o inhaler use.  Already on PPI.    Meds, vitals, and allergies reviewed.   ROS: Per HPI unless specifically indicated in ROS section   GEN: nad, alert and oriented HEENT: mucous membranes moist, TM wnl, nasal exam with minimal irritation, OP with minimal irritation.  NECK: supple w/o LA CV: rrr.  PULM: ctab but cough noted, no inc wob ABD: soft, +bs EXT: no edema

## 2017-08-02 NOTE — Patient Instructions (Signed)
Go to the lab on the way out.  We'll contact you with your xray report. Try tessalon for the cough.  Take care.  Glad to see you.

## 2017-08-03 DIAGNOSIS — R059 Cough, unspecified: Secondary | ICD-10-CM | POA: Insufficient documentation

## 2017-08-03 DIAGNOSIS — R05 Cough: Secondary | ICD-10-CM | POA: Insufficient documentation

## 2017-08-03 NOTE — Assessment & Plan Note (Signed)
Lungs are clear but cough is noted.  Use Tessalon as needed for cough.  Chest x-ray today.  He could be that she has a benign postinfectious cough.  She is still okay for outpatient follow-up.  If she has chest x-ray abnormalities we will address those.  If she does not improve with Tessalon then we can investigate and/or intervene further.  Discussed with patient.  She agrees.  Routed to PCP as FYI.

## 2017-08-07 ENCOUNTER — Ambulatory Visit (AMBULATORY_SURGERY_CENTER): Payer: Self-pay | Admitting: *Deleted

## 2017-08-07 ENCOUNTER — Other Ambulatory Visit: Payer: Self-pay

## 2017-08-07 VITALS — Ht 62.0 in | Wt 154.4 lb

## 2017-08-07 DIAGNOSIS — Z86010 Personal history of colonic polyps: Secondary | ICD-10-CM

## 2017-08-07 DIAGNOSIS — Z800 Family history of malignant neoplasm of digestive organs: Secondary | ICD-10-CM

## 2017-08-07 NOTE — Progress Notes (Signed)
Moviprep sample given to pt- Lot [id number] Expiration December 2021  No egg or soy allergy  No anesthesia or intubation problems  No diet medications taken  No home oxygen used of hx of sleep apnea  Registered in [place]

## 2017-08-09 ENCOUNTER — Other Ambulatory Visit: Payer: Self-pay | Admitting: Family Medicine

## 2017-08-09 ENCOUNTER — Encounter: Payer: Self-pay | Admitting: Gastroenterology

## 2017-08-09 ENCOUNTER — Telehealth: Payer: Self-pay | Admitting: *Deleted

## 2017-08-09 MED ORDER — ALBUTEROL SULFATE HFA 108 (90 BASE) MCG/ACT IN AERS: 1.0000 | INHALATION_SPRAY | Freq: Three times a day (TID) | RESPIRATORY_TRACT | 0 refills | Status: DC | PRN

## 2017-08-09 NOTE — Telephone Encounter (Signed)
Notified patient of Dr. Josefine Class instructions.  She has already picked up the inhaler and has used it once this pm and is doing fine with it.  Patient knows to call us if she does not continue to improve or has any complications.

## 2017-08-09 NOTE — Telephone Encounter (Signed)
Copied from [place]. Topic: Quick Communication - See Telephone Encounter >> Aug 09, 2017 12:27 PM Arletha Grippe wrote: CRM for notification. See Telephone encounter for:   08/09/17.pt was seen a week ago and was given tessalon pearls.  She is not getting better, and the medication gave her a rash. She stopped taking the medicine on Saturday. Diane Delacruz is [place], cb# [phone]

## 2017-08-09 NOTE — Telephone Encounter (Signed)
Med list updated.  She can try taking albuterol for the cough.  Prescription sent.  Allergy list updated.  If she is not getting better with the inhaler then let us know, would likely need recheck.    If she cannot take Tessalon, if she cannot take codeine, then albuterol is the next reasonable option for the cough.  I realize that she felt jittery on Sudafed.  She can likely still try albuterol.  It is possible that a person can get jittery with albuterol.  Usually if a patient just uses 1 puff then they usually do not get jittery.  Rx sent.  thanks.

## 2017-08-16 ENCOUNTER — Ambulatory Visit: Payer: Medicare Other | Admitting: Family Medicine

## 2017-08-16 ENCOUNTER — Encounter: Payer: Self-pay | Admitting: Family Medicine

## 2017-08-16 VITALS — BP 116/64 | HR 64 | Temp 98.2°F | Ht 62.0 in | Wt 155.5 lb

## 2017-08-16 DIAGNOSIS — R05 Cough: Secondary | ICD-10-CM | POA: Diagnosis not present

## 2017-08-16 DIAGNOSIS — R059 Cough, unspecified: Secondary | ICD-10-CM

## 2017-08-16 DIAGNOSIS — J019 Acute sinusitis, unspecified: Secondary | ICD-10-CM | POA: Insufficient documentation

## 2017-08-16 DIAGNOSIS — J0100 Acute maxillary sinusitis, unspecified: Secondary | ICD-10-CM

## 2017-08-16 MED ORDER — AZITHROMYCIN 250 MG PO TABS: ORAL_TABLET | ORAL | 0 refills | Status: DC

## 2017-08-16 MED ORDER — HYDROCODONE-HOMATROPINE 5-1.5 MG/5ML PO SYRP: 5.0000 mL | ORAL_SOLUTION | Freq: Four times a day (QID) | ORAL | 0 refills | Status: DC | PRN

## 2017-08-16 NOTE — Progress Notes (Signed)
Subjective:    Patient ID: Diane Delacruz, female    DOB: January 18, 1946, 72 y.o.   MRN: [id number]  HPI Here for f/u of cough   On and off from nov Worse with congestion symptoms  Cannot tell a difference   Seen by Dr Damita Dunnings 1/16 Given tessalon Reassuring exam   Had a cxr   DG Chest 2 View (Accession [id number]) (Order [id number])  Imaging  Date: 08/02/2017 Department: [place] [facility] Ordering/Authorizing: Tonia Ghent, MD  Exam Information   Status Exam Begun  Exam Ended   Final [99] 08/02/2017 7:04 PM 08/02/2017 7:08 PM  PACS Images   Show images for DG Chest 2 View  Study Result   CLINICAL DATA:  Persistent cough. History of coronary artery disease.  EXAM: CHEST  2 VIEW  COMPARISON:  None in PACs  FINDINGS: The lungs are adequately inflated. There is no focal infiltrate. There is no pleural effusion. The heart and pulmonary vascularity are normal. The mediastinum is normal in width. There calcification in the wall of the aortic arch. The bony thorax is unremarkable.  IMPRESSION: There is no pneumonia nor other acute cardiopulmonary abnormality.  Thoracic aortic atherosclerosis.   Electronically Signed   By: David  Martinique M.D.   On: 08/03/2017 09:05   Wt Readings from Last 3 Encounters:  08/16/17 155 lb 8 oz (70.5 kg)  08/07/17 154 lb 6.4 oz (70 kg)  08/02/17 154 lb (69.9 kg)   BP Readings from Last 3 Encounters:  08/16/17 116/64  08/02/17 118/72  06/21/17 128/68   Temp: 98.2 F (36.8 C)   Unfortunately tessalon gave her a rash  Albuterol mdi was sent in - not improving her symptoms  Also cannot take codeine (jittery and nauseated)  Can take hydrocodone however   Taking robitussin over the counter (CF and the DM) Also tried delsym as well  mucinex for pm use helps a bit more -makes her sleepy) Using nasonex  Also allegra   Cough is 24 hours  Both dry and wet / phlegm is yellow to green  No wheeze No sob    Has chronic pnd and throat clearing -worse lately / can spit it out (yellow) Most of rhinorrhea is clear  No sinus pain  Some pressure - below the eyes    No heartburn  Taking nexium every day   Patient Active Problem List   Diagnosis Date Noted  . Acute sinusitis 08/16/2017  . Cough 08/03/2017  . Encounter for screening mammogram for breast cancer 06/21/2017  . Elevated glucose level 06/21/2017  . Estrogen deficiency 04/27/2015  . Routine general medical examination at a health care facility 04/19/2015  . Encounter for Medicare annual wellness exam 11/07/2012  . TMJ (temporomandibular joint syndrome) 02/24/2012  . Insomnia 02/21/2011  . Family history of colon cancer 11/01/2010  . Hyperlipidemia 11/01/2010  . Vitamin D deficiency 10/10/2008  . IRRITABLE BOWEL SYNDROME 10/15/2007  . VARICOSE VEINS, LOWER EXTREMITIES 08/15/2007  . Osteoporosis 05/14/2007  . ALLERGIC RHINITIS, SEASONAL 05/11/2007  . GERD 05/11/2007  . FIBROCYSTIC BREAST DISEASE 05/11/2007  . POSTMENOPAUSAL STATUS 05/11/2007   Past Medical History:  Diagnosis Date  . Allergic rhinitis due to pollen   . Allergy   . Anxiety    past hx  . Asymptomatic varicose veins   . Bladder tumor 11/05  . CAD (coronary artery disease)   . DDD (degenerative disc disease)    in neck, neurosurgeon Dr. Trenton Gammon  . Depression   .  Diffuse cystic mastopathy   . Diverticulosis   . Dyspnea 2008   negative echo and MV scan  . Esophageal reflux   . History of gallstones   . Hypoglycemia, unspecified   . Irritable bowel syndrome   . Lumbago   . Palpitations 03/06/09   event monitor  . Pure hypercholesterolemia   . Stress fracture of foot 03/28/99   left  . Symptomatic menopausal or female climacteric states   . Unspecified adverse effect of other drug, medicinal and biological substance(995.29)   . Unspecified vitamin D deficiency    Past Surgical History:  Procedure Laterality Date  . BLADDER SURGERY     tumor removed   . CHOLECYSTECTOMY     gallstones- ultrasound 10/03  . COLONOSCOPY     Social History   Tobacco Use  . Smoking status: Never Smoker  . Smokeless tobacco: Never Used  Substance Use Topics  . Alcohol use: Yes    Alcohol/week: 0.0 oz    Comment: Rare-wine  . Drug use: No   Family History  Problem Relation Age of Onset  . Hyperlipidemia Mother   . Stroke Mother   . Arthritis Mother   . Macular degeneration Mother   . Irritable bowel syndrome Mother   . Colon cancer Father   . Heart disease Father        MI's  . COPD Sister        heavy smoker  . Colon polyps Maternal Aunt   . Breast cancer Paternal Aunt   . Osteopenia Sister   . Other Daughter        celiac spruce  . Diabetes Brother   . Diabetes Paternal Uncle   . Irritable bowel syndrome Sister   . Kidney disease Cousin   . Rectal cancer Neg Hx   . Stomach cancer Neg Hx   . Esophageal cancer Neg Hx    Allergies  Allergen Reactions  . Amoxicillin     Rash and diarrhea   . Codeine     REACTION: unknown reaction  . Flexeril [Cyclobenzaprine]     sedation  . Klonopin [Clonazepam]     Dizziness , fatigue  . Nsaids     REACTION: diarrhea  . Omeprazole     REACTION: did not work for her  . Pseudoephedrine     REACTION: jittery  . Tessalon [Benzonatate] Rash    Rash   Current Outpatient Medications on File Prior to Visit  Medication Sig Dispense Refill  . acetaminophen (TYLENOL) 500 MG tablet Take 1,000 mg by mouth every 6 (six) hours as needed for mild pain.     Marland Kitchen albuterol (PROVENTIL HFA;VENTOLIN HFA) 108 (90 Base) MCG/ACT inhaler Inhale 1 puff into the lungs 3 (three) times daily as needed (for cough). 1 Inhaler 0  . Calcium-Magnesium-Vitamin D (CALCIUM 1200+D3 PO) Take 1 tablet by mouth daily.    Marland Kitchen esomeprazole (NEXIUM) 40 MG capsule Take 1 capsule (40 mg total) by mouth daily at 12 noon. 30 capsule 11  . fexofenadine (ALLEGRA) 60 MG tablet Take 180 mg by mouth daily as needed for allergies.     .  mometasone (NASONEX) 50 MCG/ACT nasal spray Place 2 sprays into the nose daily as needed (allergies). 17 g 11  . pravastatin (PRAVACHOL) 20 MG tablet Take 1 tablet (20 mg total) by mouth daily. 90 tablet 3   No current facility-administered medications on file prior to visit.     Review of Systems  Constitutional: Positive for appetite change.  Negative for fatigue and fever.  HENT: Positive for congestion, ear pain, postnasal drip, rhinorrhea, sinus pressure and sore throat. Negative for nosebleeds.   Eyes: Negative for pain, redness and itching.  Respiratory: Positive for cough. Negative for shortness of breath and wheezing.   Cardiovascular: Negative for chest pain.  Gastrointestinal: Negative for abdominal pain, diarrhea, nausea and vomiting.  Endocrine: Negative for polyuria.  Genitourinary: Negative for dysuria, frequency and urgency.  Musculoskeletal: Negative for arthralgias and myalgias.  Allergic/Immunologic: Negative for immunocompromised state.  Neurological: Positive for headaches. Negative for dizziness, tremors, syncope, weakness and numbness.  Hematological: Negative for adenopathy. Does not bruise/bleed easily.  Psychiatric/Behavioral: Negative for dysphoric mood. The patient is not nervous/anxious.        Objective:   Physical Exam  Constitutional: She appears well-developed and well-nourished. No distress.  Well appearing   HENT:  Head: Normocephalic and atraumatic.  Right Ear: External ear normal.  Left Ear: External ear normal.  Mouth/Throat: Oropharynx is clear and moist. No oropharyngeal exudate.  Nares are injected and congested  Bilateral maxillary sinus tenderness  Post nasal drip   Eyes: Conjunctivae and EOM are normal. Pupils are equal, round, and reactive to light. Right eye exhibits no discharge. Left eye exhibits no discharge.  Neck: Normal range of motion. Neck supple.  Cardiovascular: Normal rate and regular rhythm.  Pulmonary/Chest: Effort normal  and breath sounds normal. No respiratory distress. She has no wheezes. She has no rales.  Good air exch No wheeze even with forced exp    Lymphadenopathy:    She has no cervical adenopathy.  Neurological: She is alert. No cranial nerve deficit.  Skin: Skin is warm and dry. No rash noted.  Psychiatric: She has a normal mood and affect.          Assessment & Plan:   Problem List Items Addressed This Visit      Respiratory   Acute sinusitis    This may add to post viral cough tx with zpak (pcn all) Disc symptomatic care - see instructions on AVS  Update if not starting to improve in a week or if worsening        Relevant Medications   azithromycin (ZITHROMAX Z-PAK) 250 MG tablet   HYDROcodone-homatropine (HYCODAN) 5-1.5 MG/5ML syrup     Other   Cough - Primary    Reviewed records from prev visits All to tessalon Intol of codeine  Will try hydocan for persistent post viral cough (also assoc with sinusitis)  tx the sinus infx with zpak  Change antihistamine to zyrtec? To see if it also helps Reassuring exam and cxr  Update if not starting to improve in a week or if worsening

## 2017-08-16 NOTE — Patient Instructions (Addendum)
I think you have a post infectious cough as well as early sinus infection   Take zpak for the sinus infection  Drink lots of fluids Try the hydrocodone cough medicine as needed with caution of sedation (also keep it under lock and key)  Update if not starting to improve in a week or if worsening    Update if not starting to improve in a week or if worsening

## 2017-08-16 NOTE — Assessment & Plan Note (Signed)
Reviewed records from prev visits All to tessalon Intol of codeine  Will try hydocan for persistent post viral cough (also assoc with sinusitis)  tx the sinus infx with zpak  Change antihistamine to zyrtec? To see if it also helps Reassuring exam and cxr  Update if not starting to improve in a week or if worsening

## 2017-08-16 NOTE — Assessment & Plan Note (Signed)
This may add to post viral cough tx with zpak (pcn all) Disc symptomatic care - see instructions on AVS  Update if not starting to improve in a week or if worsening

## 2017-08-21 ENCOUNTER — Encounter: Payer: Medicare Other | Admitting: Gastroenterology

## 2017-10-05 ENCOUNTER — Encounter: Payer: Self-pay | Admitting: Gastroenterology

## 2017-10-05 ENCOUNTER — Other Ambulatory Visit: Payer: Self-pay

## 2017-10-05 ENCOUNTER — Ambulatory Visit (AMBULATORY_SURGERY_CENTER): Payer: Medicare Other | Admitting: Gastroenterology

## 2017-10-05 VITALS — BP 112/54 | HR 68 | Temp 98.0°F | Resp 13 | Ht 62.0 in | Wt 154.0 lb

## 2017-10-05 DIAGNOSIS — Z800 Family history of malignant neoplasm of digestive organs: Secondary | ICD-10-CM

## 2017-10-05 DIAGNOSIS — D120 Benign neoplasm of cecum: Secondary | ICD-10-CM

## 2017-10-05 DIAGNOSIS — Z86010 Personal history of colonic polyps: Secondary | ICD-10-CM

## 2017-10-05 DIAGNOSIS — D123 Benign neoplasm of transverse colon: Secondary | ICD-10-CM

## 2017-10-05 DIAGNOSIS — Z1211 Encounter for screening for malignant neoplasm of colon: Secondary | ICD-10-CM | POA: Diagnosis not present

## 2017-10-05 MED ORDER — SODIUM CHLORIDE 0.9 % IV SOLN: 500.0000 mL | Freq: Once | INTRAVENOUS | Status: DC

## 2017-10-05 NOTE — Op Note (Signed)
[facility] Patient Name: Diane Delacruz Procedure Date: 10/05/2017 10:31 AM MRN: [id number] Endoscopist: Ladene Artist , MD Age: 72 Referring MD:  Date of Birth: [birth year] Gender: Female Account #: [id number] Procedure:                Colonoscopy Indications:              Surveillance: Personal history of adenomatous                            polyps on last colonoscopy 5 years ago. Family                            history of colon cancer, father. Medicines:                Monitored Anesthesia Care Procedure:                Pre-Anesthesia Assessment:                           - Prior to the procedure, a History and Physical                            was performed, and patient medications and                            allergies were reviewed. The patient's tolerance of                            previous anesthesia was also reviewed. The risks                            and benefits of the procedure and the sedation                            options and risks were discussed with the patient.                            All questions were answered, and informed consent                            was obtained. Prior Anticoagulants: The patient has                            taken no previous anticoagulant or antiplatelet                            agents. ASA Grade Assessment: II - A patient with                            mild systemic disease. After reviewing the risks                            and benefits, the patient was deemed in  satisfactory condition to undergo the procedure.                           After obtaining informed consent, the colonoscope                            was passed under direct vision. Throughout the                            procedure, the patient's blood pressure, pulse, and                            oxygen saturations were monitored continuously. The                            Colonoscope was introduced  through the anus and                            advanced to the the cecum, identified by                            appendiceal orifice and ileocecal valve. The                            ileocecal valve, appendiceal orifice, and rectum                            were photographed. The quality of the bowel                            preparation was good. The colonoscopy was performed                            without difficulty. The patient tolerated the                            procedure well. Scope In: 10:33:37 AM Scope Out: 10:46:09 AM Scope Withdrawal Time: 0 hours 8 minutes 33 seconds  Total Procedure Duration: 0 hours 12 minutes 32 seconds  Findings:                 The perianal and digital rectal examinations were                            normal.                           Two sessile polyps were found in the transverse                            colon and cecum. The polyps were 5 to 7 mm in size.                            These polyps were removed with a cold snare.  Resection and retrieval were complete.                           Multiple small-mouthed diverticula were found in                            the left colon. There was no evidence of                            diverticular bleeding.                           The exam was otherwise without abnormality on                            direct and retroflexion views. Complications:            No immediate complications. Estimated blood loss:                            None. Estimated Blood Loss:     Estimated blood loss: none. Impression:               - Two 5 to 7 mm polyps in the transverse colon and                            in the cecum, removed with a cold snare. Resected                            and retrieved.                           - Moderate diverticulosis in the left colon. There                            was no evidence of diverticular bleeding.                           -  The examination was otherwise normal on direct                            and retroflexion views. Recommendation:           - Repeat colonoscopy in 5 years for surveillance.                           - Patient has a contact number available for                            emergencies. The signs and symptoms of potential                            delayed complications were discussed with the                            patient. Return to normal activities tomorrow.  Written discharge instructions were provided to the                            patient.                           - High fiber diet.                           - Continue present medications.                           - Await pathology results. Ladene Artist, MD 10/05/2017 10:49:33 AM This report has been signed electronically.

## 2017-10-05 NOTE — Progress Notes (Signed)
A and O x3. Report to RN. Tolerated MAC anesthesia well.

## 2017-10-05 NOTE — Progress Notes (Signed)
Pt's states no medical or surgical changes since previsit or office visit. 

## 2017-10-05 NOTE — Patient Instructions (Signed)
**  Handouts given on polyps, Diverticulosis and High Fiber Diet**   YOU HAD AN ENDOSCOPIC PROCEDURE TODAY: Refer to the procedure report and other information in the discharge instructions given to you for any specific questions about what was found during the examination. If this information does not answer your questions, please call [place] office at [phone] to clarify.   YOU SHOULD EXPECT: Some feelings of bloating in the abdomen. Passage of more gas than usual. Walking can help get rid of the air that was put into your GI tract during the procedure and reduce the bloating. If you had a lower endoscopy (such as a colonoscopy or flexible sigmoidoscopy) you may notice spotting of blood in your stool or on the toilet paper. Some abdominal soreness may be present for a day or two, also.  DIET: Your first meal following the procedure should be a light meal and then it is ok to progress to your normal diet. A half-sandwich or bowl of soup is an example of a good first meal. Heavy or fried foods are harder to digest and may make you feel nauseous or bloated. Drink plenty of fluids but you should avoid alcoholic beverages for 24 hours. If you had a esophageal dilation, please see attached instructions for diet.    ACTIVITY: Your care partner should take you home directly after the procedure. You should plan to take it easy, moving slowly for the rest of the day. You can resume normal activity the day after the procedure however YOU SHOULD NOT DRIVE, use power tools, machinery or perform tasks that involve climbing or major physical exertion for 24 hours (because of the sedation medicines used during the test).   SYMPTOMS TO REPORT IMMEDIATELY: A gastroenterologist can be reached at any hour. Please call [phone]  for any of the following symptoms:  Following lower endoscopy (colonoscopy, flexible sigmoidoscopy) Excessive amounts of blood in the stool  Significant tenderness, worsening of abdominal  pains  Swelling of the abdomen that is new, acute  Fever of 100 or higher    FOLLOW UP:  If any biopsies were taken you will be contacted by phone or by letter within the next 1-3 weeks. Call [phone]  if you have not heard about the biopsies in 3 weeks.  Please also call with any specific questions about appointments or follow up tests.

## 2017-10-05 NOTE — Progress Notes (Signed)
Called to room to assist during endoscopic procedure.  Patient ID and intended procedure confirmed with present staff. Received instructions for my participation in the procedure from the performing physician.  

## 2017-10-06 ENCOUNTER — Telehealth: Payer: Self-pay

## 2017-10-06 NOTE — Telephone Encounter (Signed)
  Follow up Call-  Call back number 10/05/2017  Post procedure Call Back phone  # [phone] hm  Permission to leave phone message Yes  Some recent data might be hidden     Patient questions:  Do you have a fever, pain , or abdominal swelling? No. Pain Score  0 *  Have you tolerated food without any problems? Yes.    Have you been able to return to your normal activities? Yes.    Do you have any questions about your discharge instructions: Diet   No. Medications  No. Follow up visit  No.  Do you have questions or concerns about your Care? No.  Actions: * If pain score is 4 or above: No action needed, pain <4.

## 2017-10-18 ENCOUNTER — Encounter: Payer: Self-pay | Admitting: Gastroenterology

## 2018-04-13 ENCOUNTER — Ambulatory Visit: Payer: Medicare Other | Admitting: Family Medicine

## 2018-04-13 ENCOUNTER — Encounter: Payer: Self-pay | Admitting: Family Medicine

## 2018-04-13 VITALS — BP 116/70 | HR 59 | Temp 97.7°F | Ht 62.0 in | Wt 152.0 lb

## 2018-04-13 DIAGNOSIS — Z23 Encounter for immunization: Secondary | ICD-10-CM | POA: Diagnosis not present

## 2018-04-13 DIAGNOSIS — R350 Frequency of micturition: Secondary | ICD-10-CM

## 2018-04-13 DIAGNOSIS — N3001 Acute cystitis with hematuria: Secondary | ICD-10-CM | POA: Insufficient documentation

## 2018-04-13 DIAGNOSIS — N3000 Acute cystitis without hematuria: Secondary | ICD-10-CM

## 2018-04-13 LAB — POC URINALSYSI DIPSTICK (AUTOMATED)
Bilirubin, UA: NEGATIVE
Glucose, UA: NEGATIVE
Ketones, UA: NEGATIVE
LEUKOCYTES UA: NEGATIVE
NITRITE UA: NEGATIVE
PH UA: 6 (ref 5.0–8.0)
PROTEIN UA: NEGATIVE
Spec Grav, UA: 1.01 (ref 1.010–1.025)
UROBILINOGEN UA: 0.2 U/dL

## 2018-04-13 MED ORDER — SULFAMETHOXAZOLE-TRIMETHOPRIM 800-160 MG PO TABS: 1.0000 | ORAL_TABLET | Freq: Two times a day (BID) | ORAL | 0 refills | Status: DC

## 2018-04-13 NOTE — Progress Notes (Signed)
   Subjective:    Patient ID: Diane Delacruz, female    DOB: Oct 07, 1945, 72 y.o.   MRN: [id number]  HPI  Here for urinary frequency and pressure   About a week  Started a little nausea/fatigue/dull headache  Then urine frequency /urgency A little itchy feeling / no burning to urinate   No blood in urine  Some bladder pressure/not pain  occ dull pain in flank area - L side   Chills (no fever)  Temp: 97.7 F (36.5 C)   Results for orders placed or performed in visit on 04/13/18  POCT Urinalysis Dipstick (Automated)  Result Value Ref Range   Color, UA yellow    Clarity, UA clear    Glucose, UA Negative Negative   Bilirubin, UA neg    Ketones, UA neg    Spec Grav, UA 1.010 1.010 - 1.025   Blood, UA small    pH, UA 6.0 5.0 - 8.0   Protein, UA Negative Negative   Urobilinogen, UA 0.2 0.2 or 1.0 E.U./dL   Nitrite, UA neg    Leukocytes, UA Negative Negative       Review of Systems  Constitutional: Positive for fatigue. Negative for activity change, appetite change and fever.  HENT: Negative for congestion and sore throat.   Eyes: Negative for itching and visual disturbance.  Respiratory: Negative for cough and shortness of breath.   Cardiovascular: Negative for leg swelling.  Gastrointestinal: Positive for nausea. Negative for abdominal distention, abdominal pain, constipation and diarrhea.  Endocrine: Negative for cold intolerance and polydipsia.  Genitourinary: Positive for dysuria, frequency and urgency. Negative for difficulty urinating, flank pain and hematuria.  Musculoskeletal: Negative for myalgias.  Skin: Negative for rash.  Allergic/Immunologic: Negative for immunocompromised state.  Neurological: Negative for dizziness and weakness.  Hematological: Negative for adenopathy.       Objective:   Physical Exam  Constitutional: She appears well-developed and well-nourished. No distress.  Well appearing   HENT:  Head: Normocephalic and atraumatic.  Eyes:  Pupils are equal, round, and reactive to light. Conjunctivae and EOM are normal.  Neck: Normal range of motion. Neck supple.  Cardiovascular: Normal rate, regular rhythm and normal heart sounds.  Pulmonary/Chest: Effort normal and breath sounds normal.  Abdominal: Soft. Bowel sounds are normal. She exhibits no distension. There is tenderness. There is no rebound.  No cva tenderness  Mild suprapubic tenderness  Musculoskeletal: She exhibits no edema.  Lymphadenopathy:    She has no cervical adenopathy.  Neurological: She is alert. She exhibits normal muscle tone.  Skin: No rash noted.  Psychiatric: She has a normal mood and affect.          Assessment & Plan:   Problem List Items Addressed This Visit      Genitourinary   Acute cystitis - Primary    UA with pos dip for blood/nothing else Will send for cx Short tx bactrim ds while waiting for that Enc water intake Update if not starting to improve in a several days or if worsening        Relevant Orders   Urine Culture    Other Visit Diagnoses    Urinary frequency       Relevant Orders   POCT Urinalysis Dipstick (Automated) (Completed)

## 2018-04-13 NOTE — Assessment & Plan Note (Signed)
UA with pos dip for blood/nothing else Will send for cx Short tx bactrim ds while waiting for that Enc water intake Update if not starting to improve in a several days or if worsening

## 2018-04-13 NOTE — Addendum Note (Signed)
Addended by: Lurlean Nanny on: 04/13/2018 02:32 PM   Modules accepted: Orders

## 2018-04-13 NOTE — Patient Instructions (Signed)
Keep drinking water  Take bactrim DS over the weekend  We will contact you with urine culture result

## 2018-04-14 LAB — URINE CULTURE
MICRO NUMBER:: 91164446
Result:: NO GROWTH
SPECIMEN QUALITY:: ADEQUATE

## 2018-05-02 ENCOUNTER — Other Ambulatory Visit: Payer: Self-pay | Admitting: *Deleted

## 2018-05-02 MED ORDER — PRAVASTATIN SODIUM 20 MG PO TABS: 20.0000 mg | ORAL_TABLET | Freq: Every day | ORAL | 0 refills | Status: DC

## 2018-06-17 ENCOUNTER — Telehealth: Payer: Self-pay | Admitting: Family Medicine

## 2018-06-17 DIAGNOSIS — E559 Vitamin D deficiency, unspecified: Secondary | ICD-10-CM

## 2018-06-17 DIAGNOSIS — Z0000 Encounter for general adult medical examination without abnormal findings: Secondary | ICD-10-CM

## 2018-06-17 DIAGNOSIS — E7800 Pure hypercholesterolemia, unspecified: Secondary | ICD-10-CM

## 2018-06-17 DIAGNOSIS — R7309 Other abnormal glucose: Secondary | ICD-10-CM

## 2018-06-17 DIAGNOSIS — M810 Age-related osteoporosis without current pathological fracture: Secondary | ICD-10-CM

## 2018-06-17 NOTE — Telephone Encounter (Signed)
-----   Message from Eustace Pen, LPN sent at [phone]  9:00 AM EST ----- Regarding: Labs 12/2 Lab orders needed. Thank you.  Insurance:  [employer]

## 2018-06-18 ENCOUNTER — Ambulatory Visit (INDEPENDENT_AMBULATORY_CARE_PROVIDER_SITE_OTHER): Payer: Medicare Other

## 2018-06-18 VITALS — BP 102/62 | HR 74 | Temp 97.9°F | Ht 63.0 in | Wt 150.0 lb

## 2018-06-18 DIAGNOSIS — E559 Vitamin D deficiency, unspecified: Secondary | ICD-10-CM | POA: Diagnosis not present

## 2018-06-18 DIAGNOSIS — E7800 Pure hypercholesterolemia, unspecified: Secondary | ICD-10-CM | POA: Diagnosis not present

## 2018-06-18 DIAGNOSIS — Z0000 Encounter for general adult medical examination without abnormal findings: Secondary | ICD-10-CM

## 2018-06-18 DIAGNOSIS — R7309 Other abnormal glucose: Secondary | ICD-10-CM | POA: Diagnosis not present

## 2018-06-18 LAB — CBC WITH DIFFERENTIAL/PLATELET
BASOS PCT: 0.6 % (ref 0.0–3.0)
Basophils Absolute: 0 10*3/uL (ref 0.0–0.1)
Eosinophils Absolute: 0.3 10*3/uL (ref 0.0–0.7)
Eosinophils Relative: 4.8 % (ref 0.0–5.0)
HEMATOCRIT: 41.5 % (ref 36.0–46.0)
Hemoglobin: 13.9 g/dL (ref 12.0–15.0)
LYMPHS ABS: 2.4 10*3/uL (ref 0.7–4.0)
Lymphocytes Relative: 39.6 % (ref 12.0–46.0)
MCHC: 33.5 g/dL (ref 30.0–36.0)
MCV: 92.8 fl (ref 78.0–100.0)
Monocytes Absolute: 0.3 10*3/uL (ref 0.1–1.0)
Monocytes Relative: 5.7 % (ref 3.0–12.0)
NEUTROS ABS: 3 10*3/uL (ref 1.4–7.7)
NEUTROS PCT: 49.3 % (ref 43.0–77.0)
PLATELETS: 316 10*3/uL (ref 150.0–400.0)
RBC: 4.47 Mil/uL (ref 3.87–5.11)
RDW: 12.4 % (ref 11.5–15.5)
WBC: 6 10*3/uL (ref 4.0–10.5)

## 2018-06-18 LAB — COMPREHENSIVE METABOLIC PANEL
ALT: 16 U/L (ref 0–35)
AST: 21 U/L (ref 0–37)
Albumin: 4 g/dL (ref 3.5–5.2)
Alkaline Phosphatase: 92 U/L (ref 39–117)
BUN: 12 mg/dL (ref 6–23)
CALCIUM: 9.8 mg/dL (ref 8.4–10.5)
CHLORIDE: 103 meq/L (ref 96–112)
CO2: 28 meq/L (ref 19–32)
Creatinine, Ser: 1.01 mg/dL (ref 0.40–1.20)
GFR: 57.26 mL/min — AB (ref >60.00)
GLUCOSE: 98 mg/dL (ref 70–99)
Potassium: 4.7 mEq/L (ref 3.5–5.1)
Sodium: 138 mEq/L (ref 135–145)
Total Bilirubin: 0.5 mg/dL (ref 0.2–1.2)
Total Protein: 6.9 g/dL (ref 6.0–8.3)

## 2018-06-18 LAB — LIPID PANEL
CHOL/HDL RATIO: 2
Cholesterol: 173 mg/dL (ref 0–200)
HDL: 70 mg/dL (ref >39.00)
LDL Cholesterol: 90 mg/dL (ref 0–99)
NONHDL: 102.56
Triglycerides: 62 mg/dL (ref 0.0–149.0)
VLDL: 12.4 mg/dL (ref 0.0–40.0)

## 2018-06-18 LAB — HEMOGLOBIN A1C: HEMOGLOBIN A1C: 5.7 % (ref 4.6–6.5)

## 2018-06-18 LAB — VITAMIN D 25 HYDROXY (VIT D DEFICIENCY, FRACTURES): VITD: 35.94 ng/mL (ref 30.00–100.00)

## 2018-06-18 LAB — TSH: TSH: 1.65 u[IU]/mL (ref 0.35–4.50)

## 2018-06-18 NOTE — Patient Instructions (Signed)
Ms. Geurts , Thank you for taking time to come for your Medicare Wellness Visit. I appreciate your ongoing commitment to your health goals. Please review the following plan we discussed and let me know if I can assist you in the future.   These are the goals we discussed: Goals    . DIET - INCREASE WATER INTAKE     Starting 06/18/2018, I will attempt to drink 6-8 glasses of water daily.        This is a list of the screening recommended for you and due dates:  Health Maintenance  Topic Date Due  . Tetanus Vaccine  08/20/2023*  . Mammogram  08/01/2019  . Colon Cancer Screening  10/06/2022  . Flu Shot  Completed  . DEXA scan (bone density measurement)  Completed  .  Hepatitis C: One time screening is recommended by Center for Disease Control  (CDC) for  adults born from [birth year] through 1965.   Completed  . Pneumonia vaccines  Completed  *Topic was postponed. The date shown is not the original due date.   Preventive Care for Adults  A healthy lifestyle and preventive care can promote health and wellness. Preventive health guidelines for adults include the following key practices.  . A routine yearly physical is a good way to check with your health care provider about your health and preventive screening. It is a chance to share any concerns and updates on your health and to receive a thorough exam.  . Visit your dentist for a routine exam and preventive care every 6 months. Brush your teeth twice a day and floss once a day. Good oral hygiene prevents tooth decay and gum disease.  . The frequency of eye exams is based on your age, health, family medical history, use  of contact lenses, and other factors. Follow your health care provider's recommendations for frequency of eye exams.  . Eat a healthy diet. Foods like vegetables, fruits, whole grains, low-fat dairy products, and lean protein foods contain the nutrients you need without too many calories. Decrease your intake of foods high  in solid fats, added sugars, and salt. Eat the right amount of calories for you. Get information about a proper diet from your health care provider, if necessary.  . Regular physical exercise is one of the most important things you can do for your health. Most adults should get at least 150 minutes of moderate-intensity exercise (any activity that increases your heart rate and causes you to sweat) each week. In addition, most adults need muscle-strengthening exercises on 2 or more days a week.  Silver Sneakers may be a benefit available to you. To determine eligibility, you may visit the website: www.silversneakers.com or contact program at 579-006-9213 Mon-Fri between 8AM-8PM.   . Maintain a healthy weight. The body mass index (BMI) is a screening tool to identify possible weight problems. It provides an estimate of body fat based on height and weight. Your health care provider can find your BMI and can help you achieve or maintain a healthy weight.   For adults 20 years and older: ? A BMI below 18.5 is considered underweight. ? A BMI of 18.5 to 24.9 is normal. ? A BMI of 25 to 29.9 is considered overweight. ? A BMI of 30 and above is considered obese.   . Maintain normal blood lipids and cholesterol levels by exercising and minimizing your intake of saturated fat. Eat a balanced diet with plenty of fruit and vegetables. Blood tests for lipids  and cholesterol should begin at age 17 and be repeated every 5 years. If your lipid or cholesterol levels are high, you are over 50, or you are at high risk for heart disease, you may need your cholesterol levels checked more frequently. Ongoing high lipid and cholesterol levels should be treated with medicines if diet and exercise are not working.  . If you smoke, find out from your health care provider how to quit. If you do not use tobacco, please do not start.  . If you choose to drink alcohol, please do not consume more than 2 drinks per day. One  drink is considered to be 12 ounces (355 mL) of beer, 5 ounces (148 mL) of wine, or 1.5 ounces (44 mL) of liquor.  . If you are 48-64 years old, ask your health care provider if you should take aspirin to prevent strokes.  . Use sunscreen. Apply sunscreen liberally and repeatedly throughout the day. You should seek shade when your shadow is shorter than you. Protect yourself by wearing long sleeves, pants, a wide-brimmed hat, and sunglasses year round, whenever you are outdoors.  . Once a month, do a whole body skin exam, using a mirror to look at the skin on your back. Tell your health care provider of new moles, moles that have irregular borders, moles that are larger than a pencil eraser, or moles that have changed in shape or color.

## 2018-06-19 NOTE — Progress Notes (Signed)
PCP notes:   Health maintenance:  No gaps identified.  Abnormal screenings:   None  Patient concerns:   None  Nurse concerns:  None  Next PCP appt:   06/22/18 @ 1015  I reviewed health advisor's note, was available for consultation, and agree with documentation and plan. Loura Pardon MD

## 2018-06-19 NOTE — Progress Notes (Signed)
Subjective:   Diane Delacruz is a 72 y.o. female who presents for Medicare Annual (Subsequent) preventive examination.  Review of Systems: N/A Cardiac Risk Factors include: advanced age (>42men, >33 women);dyslipidemia     Objective:     Vitals: BP 102/62 (BP Location: Right Arm, Patient Position: Sitting, Cuff Size: Normal)   Pulse 74   Temp 97.9 F (36.6 C) (Oral)   Ht 5\' 3"  (1.6 m) Comment: shoes  Wt 150 lb (68 kg)   LMP 07/19/1995   SpO2 96%   BMI 26.57 kg/m   Body mass index is 26.57 kg/m.  Advanced Directives 06/18/2018 06/12/2017 05/31/2016  Does Patient Have a Medical Advance Directive? Yes No Yes  Type of Paramedic of [place];Living will - [place];Living will  Does patient want to make changes to medical advance directive? - - No - Patient declined  Copy of [place] in Chart? - - No - copy requested  Would patient like information on creating a medical advance directive? - No - Patient declined -    Tobacco Social History   Tobacco Use  Smoking Status Never Smoker  Smokeless Tobacco Never Used     Counseling given: No   Clinical Intake:  Pre-visit preparation completed: Yes  Pain : No/denies pain Pain Score: 0-No pain     Nutritional Status: BMI 25 -29 Overweight Nutritional Risks: None Diabetes: No  How often do you need to have someone help you when you read instructions, pamphlets, or other written materials from your doctor or pharmacy?: 1 - Never What is the last grade level you completed in school?: 12th grade  Interpreter Needed?: No  Comments: pt lives with spouse Information entered by :: LPinson, LPN  Past Medical History:  Diagnosis Date  . Allergic rhinitis due to pollen   . Allergy   . Anxiety    past hx  . Asymptomatic varicose veins   . Bladder tumor 11/05  . CAD (coronary artery disease)   . DDD (degenerative disc disease)    in neck, neurosurgeon Dr.  Trenton Gammon  . Depression   . Diffuse cystic mastopathy   . Diverticulosis   . Dyspnea 2008   negative echo and MV scan  . Esophageal reflux   . History of gallstones   . Hypoglycemia, unspecified   . Irritable bowel syndrome   . Lumbago   . Palpitations 03/06/09   event monitor  . Pure hypercholesterolemia   . Stress fracture of foot 03/28/99   left  . Symptomatic menopausal or female climacteric states   . Unspecified adverse effect of other drug, medicinal and biological substance(995.29)   . Unspecified vitamin D deficiency    Past Surgical History:  Procedure Laterality Date  . BLADDER SURGERY     tumor removed  . CHOLECYSTECTOMY     gallstones- ultrasound 10/03  . COLONOSCOPY     Family History  Problem Relation Age of Onset  . Hyperlipidemia Mother   . Stroke Mother   . Arthritis Mother   . Macular degeneration Mother   . Irritable bowel syndrome Mother   . Colon cancer Father 41       died 10  . Heart disease Father        MI's  . COPD Sister        heavy smoker  . Colon polyps Maternal Aunt   . Breast cancer Paternal Aunt   . Osteopenia Sister   . Other Daughter  celiac spruce  . Diabetes Brother   . Diabetes Paternal Uncle   . Irritable bowel syndrome Sister   . Kidney disease Cousin   . Rectal cancer Neg Hx   . Stomach cancer Neg Hx   . Esophageal cancer Neg Hx   . Liver cancer Neg Hx   . Pancreatic cancer Neg Hx   . Prostate cancer Neg Hx    Social History   Socioeconomic History  . Marital status: Married    Spouse name: Not on file  . Number of children: 1  . Years of education: Not on file  . Highest education level: Not on file  Occupational History  . Occupation: retired    Fish farm manager: unemployed  Social Needs  . Financial resource strain: Not on file  . Food insecurity:    Worry: Not on file    Inability: Not on file  . Transportation needs:    Medical: Not on file    Non-medical: Not on file  Tobacco Use  . Smoking status:  Never Smoker  . Smokeless tobacco: Never Used  Substance and Sexual Activity  . Alcohol use: Yes    Alcohol/week: 0.0 standard drinks    Comment: Rare-wine  . Drug use: No  . Sexual activity: Never  Lifestyle  . Physical activity:    Days per week: Not on file    Minutes per session: Not on file  . Stress: Not on file  Relationships  . Social connections:    Talks on phone: Not on file    Gets together: Not on file    Attends religious service: Not on file    Active member of club or organization: Not on file    Attends meetings of clubs or organizations: Not on file    Relationship status: Not on file  Other Topics Concern  . Not on file  Social History Narrative   Took care 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   Regular exercise    Outpatient Encounter Medications as of 06/18/2018  Medication Sig  . acetaminophen (TYLENOL) 500 MG tablet Take 1,000 mg by mouth every 6 (six) hours as needed for mild pain.   . Calcium-Magnesium-Vitamin D (CALCIUM 1200+D3 PO) Take 1 tablet by mouth daily.  Marland Kitchen esomeprazole (NEXIUM) 40 MG capsule Take 1 capsule (40 mg total) by mouth daily at 12 noon.  . fexofenadine (ALLEGRA) 60 MG tablet Take 180 mg by mouth daily as needed for allergies.   . mometasone (NASONEX) 50 MCG/ACT nasal spray Place 2 sprays into the nose daily as needed (allergies).  . pravastatin (PRAVACHOL) 20 MG tablet Take 1 tablet (20 mg total) by mouth daily.  . [DISCONTINUED] albuterol (PROVENTIL HFA;VENTOLIN HFA) 108 (90 Base) MCG/ACT inhaler Inhale 1 puff into the lungs 3 (three) times daily as needed (for cough).  . [DISCONTINUED] sulfamethoxazole-trimethoprim (BACTRIM DS,SEPTRA DS) 800-160 MG tablet Take 1 tablet by mouth 2 (two) times daily.   Facility-Administered Encounter Medications as of 06/18/2018  Medication  . 0.9 %  sodium chloride infusion    Activities of Daily Living In your present state of health, do you have any difficulty performing the following  activities: 06/18/2018  Hearing? N  Vision? N  Difficulty concentrating or making decisions? N  Walking or climbing stairs? N  Dressing or bathing? N  Doing errands, shopping? N  Preparing Food and eating ? N  Using the Toilet? N  In the past six months, have you accidently leaked urine? N  Do you have problems with loss of bowel control? N  Managing your Medications? N  Managing your Finances? N  Housekeeping or managing your Housekeeping? N  Some recent data might be hidden    Patient Care Team: Tower, Wynelle Fanny, MD as PCP - General Thelma Comp, [place] as Consulting Physician (Optometry) Sydnee Levans, MD as Consulting Physician (Dermatology)    Assessment:   This is a routine wellness examination for Tam[place].   Hearing Screening   125Hz  250Hz  500Hz  1000Hz  2000Hz  3000Hz  4000Hz  6000Hz  8000Hz   Right ear:   40 40 40  40    Left ear:   40 40 40  40     Exercise Activities and Dietary recommendations Current Exercise Habits: The patient does not participate in regular exercise at present, Exercise limited by: None identified  Goals    . DIET - INCREASE WATER INTAKE     Starting 06/18/2018, I will attempt to drink 6-8 glasses of water daily.        Fall Risk Fall Risk  06/18/2018 06/12/2017 05/31/2016 08/18/2015 04/27/2015  Falls in the past year? 0 No No No No   Depression Screen PHQ 2/9 Scores 06/18/2018 06/12/2017 05/31/2016 04/27/2015  PHQ - 2 Score 0 0 0 0  PHQ- 9 Score 0 0 - -     Cognitive Function MMSE - Mini Mental State Exam 06/18/2018 06/12/2017 05/31/2016  Orientation to time 5 5 5   Orientation to Place 5 5 5   Registration 3 3 3   Attention/ Calculation 0 0 0  Recall 3 3 3   Language- name 2 objects 0 0 0  Language- repeat 1 1 1   Language- follow 3 step command 3 3 3   Language- read & follow direction 0 0 0  Write a sentence 0 0 0  Copy design 0 0 0  Total score 20 20 20      PLEASE NOTE: A Mini-Cog screen was completed. Maximum score is 20. A value  of 0 denotes this part of [place] MMSE was not completed or the patient failed this part of the Mini-Cog screening.   Mini-Cog Screening Orientation to Time - Max 5 pts Orientation to Place - Max 5 pts Registration - Max 3 pts Recall - Max 3 pts Language Repeat - Max 1 pts Language Follow 3 Step Command - Max 3 pts     Immunization History  Administered Date(s) Administered  . Influenza Split 04/26/2011  . Influenza,inj,Quad PF,6+ Mos 04/25/2014, 04/27/2015, 05/02/2017, 04/13/2018  . Influenza-Unspecified 04/27/2016  . Pneumococcal Conjugate-13 04/25/2014  . Pneumococcal Polysaccharide-23 11/02/2011  . Td 11/11/2002    Screening Tests Health Maintenance  Topic Date Due  . TETANUS/TDAP  08/20/2023 (Originally 11/10/2012)  . MAMMOGRAM  08/01/2019  . COLONOSCOPY  10/06/2022  . INFLUENZA VACCINE  Completed  . DEXA SCAN  Completed  . Hepatitis C Screening  Completed  . PNA vac Low Risk Adult  Completed       Plan:     I have personally reviewed, addressed, and noted the following in the patient's chart:  A. Medical and social history B. Use of alcohol, tobacco or illicit drugs  C. Current medications and supplements D. Functional ability and status E.  Nutritional status F.  Physical activity G. Advance directives H. List of other physicians I.  Hospitalizations, surgeries, and ER visits in previous 12 months J.  [place] to include hearing, vision, cognitive, depression L. Referrals and appointments - none  In addition, I have reviewed and discussed  with patient certain preventive protocols, quality metrics, and best practice recommendations. A written personalized care plan for preventive services as well as general preventive health recommendations were provided to patient.  See attached scanned questionnaire for additional information.   Signed,   Lindell Noe, MHA, BS, LPN Health Coach

## 2018-06-22 ENCOUNTER — Ambulatory Visit (INDEPENDENT_AMBULATORY_CARE_PROVIDER_SITE_OTHER): Payer: Medicare Other | Admitting: Family Medicine

## 2018-06-22 ENCOUNTER — Encounter: Payer: Self-pay | Admitting: Family Medicine

## 2018-06-22 VITALS — BP 118/62 | HR 68 | Temp 97.9°F | Ht 63.0 in | Wt 150.2 lb

## 2018-06-22 DIAGNOSIS — Z0000 Encounter for general adult medical examination without abnormal findings: Secondary | ICD-10-CM

## 2018-06-22 DIAGNOSIS — M810 Age-related osteoporosis without current pathological fracture: Secondary | ICD-10-CM

## 2018-06-22 DIAGNOSIS — E559 Vitamin D deficiency, unspecified: Secondary | ICD-10-CM

## 2018-06-22 DIAGNOSIS — Z800 Family history of malignant neoplasm of digestive organs: Secondary | ICD-10-CM

## 2018-06-22 DIAGNOSIS — R7309 Other abnormal glucose: Secondary | ICD-10-CM

## 2018-06-22 DIAGNOSIS — E7800 Pure hypercholesterolemia, unspecified: Secondary | ICD-10-CM

## 2018-06-22 MED ORDER — PRAVASTATIN SODIUM 20 MG PO TABS: 20.0000 mg | ORAL_TABLET | Freq: Every day | ORAL | 3 refills | Status: DC

## 2018-06-22 NOTE — Patient Instructions (Addendum)
You need 5 days of exercise per week - 30 or more minutes  Treadmill or something she likes more   Don't forget to schedule your mammogram   You have osteoporosis - think about medication  I think it would be a good idea  Look at the info I printed on raloxifene and alendronate as well on osteoporosis   If you are interested in the new shingles vaccine (Shingrix) - call your local pharmacy to check on coverage and availability  If affordable, get on a wait list at your pharmacy to get the vaccine.

## 2018-06-22 NOTE — Progress Notes (Signed)
Subjective:    Patient ID: Diane Delacruz, female    DOB: March 23, 1946, 72 y.o.   MRN: [id number]  HPI Here for health maintenance exam and to review chronic medical problems    Has felt pretty good overall  More pnd and cough this week - ? Coming down with a cold   Wt Readings from Last 3 Encounters:  06/22/18 150 lb 4 oz (68.2 kg)  06/18/18 150 lb (68 kg)  04/13/18 152 lb (68.9 kg)  wt is stable  Eats fair  Exercise- not a lot (not a priority for her and she does not think we will change that)  Plays with dog several times per day 26.62 kg/m    Had amw on 12/2 No gaps or concerns  Mammogram 1/19 - due this [facility] /she will make her own appt Self breast exam -no lumps   Colonoscopy 3/19 with 5 y recall  fam hx of colon cancer in father   dexa 1/19  FN osteoporosis  Vit D level 35.9 Has a treadmill -does not use it  Declines medication   Zoster status -has had shingles twice  but not the vaccine   H/o elevated glucose Lab Results  Component Value Date   HGBA1C 5.7 06/18/2018   Hyperlipidemia Lab Results  Component Value Date   CHOL 173 06/18/2018   CHOL 171 06/12/2017   CHOL 169 05/20/2016   Lab Results  Component Value Date   HDL 70.00 06/18/2018   HDL 70.10 06/12/2017   HDL 73.40 05/20/2016   Lab Results  Component Value Date   LDLCALC 90 06/18/2018   LDLCALC 90 06/12/2017   LDLCALC 78 05/20/2016   Lab Results  Component Value Date   TRIG 62.0 06/18/2018   TRIG 56.0 06/12/2017   TRIG 86.0 05/20/2016   Lab Results  Component Value Date   CHOLHDL 2 06/18/2018   CHOLHDL 2 06/12/2017   CHOLHDL 2 05/20/2016   Lab Results  Component Value Date   LDLDIRECT 170.5 04/30/2012   LDLDIRECT 144.6 10/31/2011   LDLDIRECT 137.0 12/01/2010   pravastatin and diet   Lab Results  Component Value Date   CREATININE 1.01 06/18/2018   BUN 12 06/18/2018   NA 138 06/18/2018   K 4.7 06/18/2018   CL 103 06/18/2018   CO2 28 06/18/2018   Lab Results    Component Value Date   ALT 16 06/18/2018   AST 21 06/18/2018   ALKPHOS 92 06/18/2018   BILITOT 0.5 06/18/2018    Lab Results  Component Value Date   WBC 6.0 06/18/2018   HGB 13.9 06/18/2018   HCT 41.5 06/18/2018   MCV 92.8 06/18/2018   PLT 316.0 06/18/2018    Lab Results  Component Value Date   TSH 1.65 06/18/2018     Patient Active Problem List   Diagnosis Date Noted  . Cough 08/03/2017  . Encounter for screening mammogram for breast cancer 06/21/2017  . Elevated glucose level 06/21/2017  . Estrogen deficiency 04/27/2015  . Routine general medical examination at a health care facility 04/19/2015  . Encounter for Medicare annual wellness exam 11/07/2012  . TMJ (temporomandibular joint syndrome) 02/24/2012  . Insomnia 02/21/2011  . Family history of colon cancer 11/01/2010  . Hyperlipidemia 11/01/2010  . Vitamin D deficiency 10/10/2008  . IRRITABLE BOWEL SYNDROME 10/15/2007  . VARICOSE VEINS, LOWER EXTREMITIES 08/15/2007  . Osteoporosis 05/14/2007  . ALLERGIC RHINITIS, SEASONAL 05/11/2007  . GERD 05/11/2007  . FIBROCYSTIC BREAST DISEASE 05/11/2007  .  POSTMENOPAUSAL STATUS 05/11/2007   Past Medical History:  Diagnosis Date  . Allergic rhinitis due to pollen   . Allergy   . Anxiety    past hx  . Asymptomatic varicose veins   . Bladder tumor 11/05  . CAD (coronary artery disease)   . DDD (degenerative disc disease)    in neck, neurosurgeon Dr. Trenton Gammon  . Depression   . Diffuse cystic mastopathy   . Diverticulosis   . Dyspnea 2008   negative echo and MV scan  . Esophageal reflux   . History of gallstones   . Hypoglycemia, unspecified   . Irritable bowel syndrome   . Lumbago   . Palpitations 03/06/09   event monitor  . Pure hypercholesterolemia   . Stress fracture of foot 03/28/99   left  . Symptomatic menopausal or female climacteric states   . Unspecified adverse effect of other drug, medicinal and biological substance(995.29)   . Unspecified vitamin D  deficiency    Past Surgical History:  Procedure Laterality Date  . BLADDER SURGERY     tumor removed  . CHOLECYSTECTOMY     gallstones- ultrasound 10/03  . COLONOSCOPY     Social History   Tobacco Use  . Smoking status: Never Smoker  . Smokeless tobacco: Never Used  Substance Use Topics  . Alcohol use: Yes    Alcohol/week: 0.0 standard drinks    Comment: Rare-wine  . Drug use: No   Family History  Problem Relation Age of Onset  . Hyperlipidemia Mother   . Stroke Mother   . Arthritis Mother   . Macular degeneration Mother   . Irritable bowel syndrome Mother   . Colon cancer Father 46       died 22  . Heart disease Father        MI's  . COPD Sister        heavy smoker  . Colon polyps Maternal Aunt   . Breast cancer Paternal Aunt   . Osteopenia Sister   . Other Daughter        celiac spruce  . Diabetes Brother   . Diabetes Paternal Uncle   . Irritable bowel syndrome Sister   . Kidney disease Cousin   . Rectal cancer Neg Hx   . Stomach cancer Neg Hx   . Esophageal cancer Neg Hx   . Liver cancer Neg Hx   . Pancreatic cancer Neg Hx   . Prostate cancer Neg Hx    Allergies  Allergen Reactions  . Amoxicillin     Rash and diarrhea   . Codeine     REACTION: unknown reaction  . Flexeril [Cyclobenzaprine]     sedation  . Klonopin [Clonazepam]     Dizziness , fatigue  . Nsaids     REACTION: diarrhea  . Omeprazole     REACTION: did not work for her  . Pseudoephedrine     REACTION: jittery  . Tessalon [Benzonatate] Rash    Rash   Current Outpatient Medications on File Prior to Visit  Medication Sig Dispense Refill  . acetaminophen (TYLENOL) 500 MG tablet Take 1,000 mg by mouth every 6 (six) hours as needed for mild pain.     . Calcium-Magnesium-Vitamin D (CALCIUM 1200+D3 PO) Take 1 tablet by mouth daily.    Marland Kitchen esomeprazole (NEXIUM) 40 MG capsule Take 1 capsule (40 mg total) by mouth daily at 12 noon. 30 capsule 11  . fexofenadine (ALLEGRA) 60 MG tablet Take  180 mg by mouth daily  as needed for allergies.     . mometasone (NASONEX) 50 MCG/ACT nasal spray Place 2 sprays into the nose daily as needed (allergies). 17 g 11   Current Facility-Administered Medications on File Prior to Visit  Medication Dose Route Frequency Provider Last Rate Last Dose  . 0.9 %  sodium chloride infusion  500 mL Intravenous Once Ladene Artist, MD        Review of Systems  Constitutional: Negative for activity change, appetite change, fatigue, fever and unexpected weight change.  HENT: Negative for congestion, ear pain, rhinorrhea, sinus pressure and sore throat.   Eyes: Negative for pain, redness and visual disturbance.  Respiratory: Negative for cough, shortness of breath and wheezing.   Cardiovascular: Negative for chest pain and palpitations.  Gastrointestinal: Negative for abdominal pain, blood in stool, constipation and diarrhea.  Endocrine: Negative for polydipsia and polyuria.  Genitourinary: Negative for dysuria, frequency and urgency.  Musculoskeletal: Negative for arthralgias, back pain and myalgias.  Skin: Negative for pallor and rash.  Allergic/Immunologic: Negative for environmental allergies.  Neurological: Negative for dizziness, syncope and headaches.  Hematological: Negative for adenopathy. Does not bruise/bleed easily.  Psychiatric/Behavioral: Negative for decreased concentration and dysphoric mood. The patient is not nervous/anxious.        Objective:   Physical Exam  Constitutional: She appears well-developed and well-nourished. No distress.  Well appearing   HENT:  Head: Normocephalic and atraumatic.  Right Ear: External ear normal.  Left Ear: External ear normal.  Mouth/Throat: Oropharynx is clear and moist.  Eyes: Pupils are equal, round, and reactive to light. Conjunctivae and EOM are normal. No scleral icterus.  Neck: Normal range of motion. Neck supple. No JVD present. Carotid bruit is not present. No thyromegaly present.    Cardiovascular: Normal rate, regular rhythm, normal heart sounds and intact distal pulses. Exam reveals no gallop.  Pulmonary/Chest: Effort normal and breath sounds normal. No stridor. No respiratory distress. She has no wheezes. She has no rales. She exhibits no tenderness. No breast tenderness, discharge or bleeding.  Abdominal: Soft. Bowel sounds are normal. She exhibits no distension, no abdominal bruit and no mass. There is no tenderness.  Genitourinary: No breast tenderness, discharge or bleeding.  Genitourinary Comments: Breast exam: No mass, nodules, thickening, tenderness, bulging, retraction, inflamation, nipple discharge or skin changes noted.  No axillary or clavicular LA.      Musculoskeletal: Normal range of motion. She exhibits no edema or tenderness.  Lymphadenopathy:    She has no cervical adenopathy.  Neurological: She is alert. She has normal reflexes. She displays normal reflexes. No cranial nerve deficit. She exhibits normal muscle tone. Coordination normal.  Skin: Skin is warm and dry. No rash noted. No erythema. No pallor.  Solar lentigines diffusely   Psychiatric: She has a normal mood and affect.  Pleasant           Assessment & Plan:   Problem List Items Addressed This Visit      Musculoskeletal and Integument   Osteoporosis    dexa 1/19  Has declined medication thus far Urged her to re consider Also enc to exercise  D level is tx   Given handouts on raloxifine and alendronate to rev and let us know if she would consider either   Next dexa due 1/21        Other   Vitamin D deficiency    D level 35.9  Vitamin D level is therapeutic with current supplementation Disc importance of this to bone and  overall health       Routine general medical examination at a health care facility - Primary    Reviewed health habits including diet and exercise and skin cancer prevention Reviewed appropriate screening tests for age  Also reviewed health mt list,  fam hx and immunization status , as well as social and family history   See HPI Labs reviewed  Disc getting shingrix vaccine if affordable  Pt will schedule her own mammogram in Jan You need 5 days of exercise per week - 30 or more minutes  Treadmill or something she likes more   Don't forget to schedule your mammogram   You have osteoporosis - think about medication  I think it would be a good idea  Look at the info I printed on raloxifene and alendronate as well on osteoporosis   If you are interested in the new shingles vaccine (Shingrix) - call your local pharmacy to check on coverage and availability  If affordable, get on a wait list at your pharmacy to get the vaccine.      Hyperlipidemia    On pravastatin and diet Disc goals for lipids and reasons to control them Rev last labs with pt Rev low sat fat diet in detail Well controlled       Relevant Medications   pravastatin (PRAVACHOL) 20 MG tablet   Family history of colon cancer    Colonoscopy utd from 3/19  5 y recall       Elevated glucose level    Lab Results  Component Value Date   HGBA1C 5.7 06/18/2018   disc imp of low glycemic diet and wt loss to prevent DM2

## 2018-06-24 NOTE — Assessment & Plan Note (Signed)
Reviewed health habits including diet and exercise and skin cancer prevention Reviewed appropriate screening tests for age  Also reviewed health mt list, fam hx and immunization status , as well as social and family history   See HPI Labs reviewed  Disc getting shingrix vaccine if affordable  Pt will schedule her own mammogram in Jan You need 5 days of exercise per week - 30 or more minutes  Treadmill or something she likes more   Don't forget to schedule your mammogram   You have osteoporosis - think about medication  I think it would be a good idea  Look at the info I printed on raloxifene and alendronate as well on osteoporosis   If you are interested in the new shingles vaccine (Shingrix) - call your local pharmacy to check on coverage and availability  If affordable, get on a wait list at your pharmacy to get the vaccine.

## 2018-06-24 NOTE — Assessment & Plan Note (Signed)
Colonoscopy utd from 3/19  5 y recall

## 2018-06-24 NOTE — Assessment & Plan Note (Signed)
Lab Results  Component Value Date   HGBA1C 5.7 06/18/2018   disc imp of low glycemic diet and wt loss to prevent DM2

## 2018-06-24 NOTE — Assessment & Plan Note (Signed)
D level 35.9  Vitamin D level is therapeutic with current supplementation Disc importance of this to bone and overall health

## 2018-06-24 NOTE — Assessment & Plan Note (Signed)
On pravastatin and diet Disc goals for lipids and reasons to control them Rev last labs with pt Rev low sat fat diet in detail Well controlled

## 2018-06-24 NOTE — Assessment & Plan Note (Signed)
dexa 1/19  Has declined medication thus far Urged her to re consider Also enc to exercise  D level is tx   Given handouts on raloxifine and alendronate to rev and let us know if she would consider either   Next dexa due 1/21

## 2018-09-21 DIAGNOSIS — H524 Presbyopia: Secondary | ICD-10-CM | POA: Diagnosis not present

## 2019-02-12 ENCOUNTER — Other Ambulatory Visit: Payer: Self-pay

## 2019-02-12 ENCOUNTER — Ambulatory Visit (INDEPENDENT_AMBULATORY_CARE_PROVIDER_SITE_OTHER): Payer: Medicare Other | Admitting: Family Medicine

## 2019-02-12 ENCOUNTER — Encounter: Payer: Self-pay | Admitting: Family Medicine

## 2019-02-12 VITALS — BP 116/60 | HR 83 | Temp 98.3°F | Ht 63.0 in | Wt 146.3 lb

## 2019-02-12 DIAGNOSIS — Z23 Encounter for immunization: Secondary | ICD-10-CM | POA: Diagnosis not present

## 2019-02-12 DIAGNOSIS — W57XXXA Bitten or stung by nonvenomous insect and other nonvenomous arthropods, initial encounter: Secondary | ICD-10-CM | POA: Diagnosis not present

## 2019-02-12 DIAGNOSIS — S60569A Insect bite (nonvenomous) of unspecified hand, initial encounter: Secondary | ICD-10-CM

## 2019-02-12 MED ORDER — AZITHROMYCIN 250 MG PO TABS: ORAL_TABLET | ORAL | 0 refills | Status: DC

## 2019-02-12 NOTE — Progress Notes (Signed)
Subjective:    Patient ID: Diane Delacruz, female    DOB: 05-Apr-1946, 73 y.o.   MRN: [id number]  HPI Pt presents for a wound on her hand-possibly a bug bite   Tetanus shot 4/04 Td  3-4 days ago  Tiny bump over 2nd mcp joint - very itchy like a bite Then became more red and bigger It is sensitive/sore  Less itchy  No drainage   Didn't witness a bug bite but suspects that   She does mow outside   No fever Feels ok   Patient Active Problem List   Diagnosis Date Noted  . Insect bite, infected 02/12/2019  . Cough 08/03/2017  . Encounter for screening mammogram for breast cancer 06/21/2017  . Elevated glucose level 06/21/2017  . Estrogen deficiency 04/27/2015  . Routine general medical examination at a health care facility 04/19/2015  . Encounter for Medicare annual wellness exam 11/07/2012  . TMJ (temporomandibular joint syndrome) 02/24/2012  . Insomnia 02/21/2011  . Family history of colon cancer 11/01/2010  . Hyperlipidemia 11/01/2010  . Vitamin D deficiency 10/10/2008  . IRRITABLE BOWEL SYNDROME 10/15/2007  . VARICOSE VEINS, LOWER EXTREMITIES 08/15/2007  . Osteoporosis 05/14/2007  . ALLERGIC RHINITIS, SEASONAL 05/11/2007  . GERD 05/11/2007  . FIBROCYSTIC BREAST DISEASE 05/11/2007  . POSTMENOPAUSAL STATUS 05/11/2007   Past Medical History:  Diagnosis Date  . Allergic rhinitis due to pollen   . Allergy   . Anxiety    past hx  . Asymptomatic varicose veins   . Bladder tumor 11/05  . CAD (coronary artery disease)   . DDD (degenerative disc disease)    in neck, neurosurgeon Dr. Trenton Gammon  . Depression   . Diffuse cystic mastopathy   . Diverticulosis   . Dyspnea 2008   negative echo and MV scan  . Esophageal reflux   . History of gallstones   . Hypoglycemia, unspecified   . Irritable bowel syndrome   . Lumbago   . Palpitations 03/06/09   event monitor  . Pure hypercholesterolemia   . Stress fracture of foot 03/28/99   left  . Symptomatic menopausal or  female climacteric states   . Unspecified adverse effect of other drug, medicinal and biological substance(995.29)   . Unspecified vitamin D deficiency    Past Surgical History:  Procedure Laterality Date  . BLADDER SURGERY     tumor removed  . CHOLECYSTECTOMY     gallstones- ultrasound 10/03  . COLONOSCOPY     Social History   Tobacco Use  . Smoking status: Never Smoker  . Smokeless tobacco: Never Used  Substance Use Topics  . Alcohol use: Yes    Alcohol/week: 0.0 standard drinks    Comment: Rare-wine  . Drug use: No   Family History  Problem Relation Age of Onset  . Hyperlipidemia Mother   . Stroke Mother   . Arthritis Mother   . Macular degeneration Mother   . Irritable bowel syndrome Mother   . Colon cancer Father 60       died 27  . Heart disease Father        MI's  . COPD Sister        heavy smoker  . Colon polyps Maternal Aunt   . Breast cancer Paternal Aunt   . Osteopenia Sister   . Other Daughter        celiac spruce  . Diabetes Brother   . Diabetes Paternal Uncle   . Irritable bowel syndrome Sister   . Kidney  disease Cousin   . Rectal cancer Neg Hx   . Stomach cancer Neg Hx   . Esophageal cancer Neg Hx   . Liver cancer Neg Hx   . Pancreatic cancer Neg Hx   . Prostate cancer Neg Hx    Allergies  Allergen Reactions  . Amoxicillin     Rash and diarrhea   . Codeine     REACTION: unknown reaction  . Flexeril [Cyclobenzaprine]     sedation  . Klonopin [Clonazepam]     Dizziness , fatigue  . Nsaids     REACTION: diarrhea  . Omeprazole     REACTION: did not work for her  . Pseudoephedrine     REACTION: jittery  . Tessalon [Benzonatate] Rash    Rash   Current Outpatient Medications on File Prior to Visit  Medication Sig Dispense Refill  . acetaminophen (TYLENOL) 500 MG tablet Take 1,000 mg by mouth every 6 (six) hours as needed for mild pain.     . Calcium-Magnesium-Vitamin D (CALCIUM 1200+D3 PO) Take 1 tablet by mouth daily.    Marland Kitchen  esomeprazole (NEXIUM) 40 MG capsule Take 1 capsule (40 mg total) by mouth daily at 12 noon. 30 capsule 11  . fexofenadine (ALLEGRA) 60 MG tablet Take 180 mg by mouth daily as needed for allergies.     . mometasone (NASONEX) 50 MCG/ACT nasal spray Place 2 sprays into the nose daily as needed (allergies). 17 g 11  . pravastatin (PRAVACHOL) 20 MG tablet Take 1 tablet (20 mg total) by mouth daily. 90 tablet 3   No current facility-administered medications on file prior to visit.       Review of Systems  Constitutional: Negative for activity change, appetite change, fatigue, fever and unexpected weight change.  HENT: Negative for congestion, ear pain, rhinorrhea, sinus pressure and sore throat.   Eyes: Negative for pain, redness and visual disturbance.  Respiratory: Negative for cough, shortness of breath and wheezing.   Cardiovascular: Negative for chest pain and palpitations.  Gastrointestinal: Negative for abdominal pain, blood in stool, constipation and diarrhea.  Endocrine: Negative for polydipsia and polyuria.  Genitourinary: Negative for dysuria, frequency and urgency.  Musculoskeletal: Negative for arthralgias, back pain and myalgias.  Skin: Positive for wound. Negative for pallor and rash.       Itching and soreness of bug bite  Allergic/Immunologic: Negative for environmental allergies.  Neurological: Negative for dizziness, syncope and headaches.  Hematological: Negative for adenopathy. Does not bruise/bleed easily.  Psychiatric/Behavioral: Negative for decreased concentration and dysphoric mood. The patient is not nervous/anxious.        Objective:   Physical Exam Constitutional:      General: She is not in acute distress.    Appearance: Normal appearance. She is normal weight. She is not ill-appearing.  HENT:     Head: Normocephalic and atraumatic.     Mouth/Throat:     Mouth: Mucous membranes are moist.  Eyes:     General: No scleral icterus.    Extraocular Movements:  Extraocular movements intact.     Conjunctiva/sclera: Conjunctivae normal.     Pupils: Pupils are equal, round, and reactive to light.  Neck:     Musculoskeletal: No neck rigidity.  Cardiovascular:     Rate and Rhythm: Normal rate and regular rhythm.  Pulmonary:     Effort: Pulmonary effort is normal. No respiratory distress.     Breath sounds: Normal breath sounds.  Musculoskeletal:     Comments: Nl rom of hands  Lymphadenopathy:     Cervical: No cervical adenopathy.  Skin:    General: Skin is warm and dry.     Findings: Erythema present.     Comments: 1.5 cm area of erythema and induration on lateral L hand close to 2nd MCP joint  Nl rom of joint  slt tender only over red area No fluctuance or drainage No vesicles  No excoriations   Neurological:     Mental Status: She is alert.     Sensory: No sensory deficit.  Psychiatric:        Mood and Affect: Mood normal.           Assessment & Plan:   Problem List Items Addressed This Visit      Other   Insect bite, infected - Primary    Area of erythema and induration L hand near 2nd MCP joint - started with itching and now tender and more red  Suspect infection  Cover with zpak (pcn all) Warm/cool compress prn  inst to call and f/u if inc redness/swelling/pain or streaking or if area involves whole joint or if pain to move finger  She voiced understanding Updated Td today      Relevant Medications   azithromycin (ZITHROMAX Z-PAK) 250 MG tablet   Other Relevant Orders   Td : Tetanus/diphtheria >7yo Preservative  free (Completed)

## 2019-02-12 NOTE — Assessment & Plan Note (Signed)
Area of erythema and induration L hand near 2nd MCP joint - started with itching and now tender and more red  Suspect infection  Cover with zpak (pcn all) Warm/cool compress prn  inst to call and f/u if inc redness/swelling/pain or streaking or if area involves whole joint or if pain to move finger  She voiced understanding Updated Td today

## 2019-02-12 NOTE — Patient Instructions (Signed)
Tetanus shot today  For possible infection take the zpak as directed  Use cold or warm compress if needed (whichever feels better) Keep clean with soap and water   If redness or swelling or pain worsen - call  If redness extends to the whole joint-also let us know    Update if not starting to improve in a week or if worsening

## 2019-03-20 ENCOUNTER — Telehealth: Payer: Self-pay

## 2019-03-20 ENCOUNTER — Encounter: Payer: Self-pay | Admitting: Family Medicine

## 2019-03-20 ENCOUNTER — Ambulatory Visit: Payer: Medicare Other | Admitting: Family Medicine

## 2019-03-20 ENCOUNTER — Ambulatory Visit (INDEPENDENT_AMBULATORY_CARE_PROVIDER_SITE_OTHER): Payer: Medicare Other | Admitting: Family Medicine

## 2019-03-20 VITALS — Temp 96.4°F | Wt 145.0 lb

## 2019-03-20 DIAGNOSIS — B9789 Other viral agents as the cause of diseases classified elsewhere: Secondary | ICD-10-CM | POA: Diagnosis not present

## 2019-03-20 DIAGNOSIS — R42 Dizziness and giddiness: Secondary | ICD-10-CM | POA: Diagnosis not present

## 2019-03-20 DIAGNOSIS — J069 Acute upper respiratory infection, unspecified: Secondary | ICD-10-CM | POA: Diagnosis not present

## 2019-03-20 MED ORDER — MECLIZINE HCL 25 MG PO TABS: 25.0000 mg | ORAL_TABLET | Freq: Three times a day (TID) | ORAL | 0 refills | Status: DC | PRN

## 2019-03-20 NOTE — Assessment & Plan Note (Signed)
Positional, mild with some nausea  No other neuro changes except mild HA In setting of viral uri /may be from inner ear  Meclizine sent -use prn with caution of sedation  Rest/fluids/ change position slowly  covid test ordered  Enc pt to call if symptoms worsen

## 2019-03-20 NOTE — Telephone Encounter (Signed)
I will see her then  

## 2019-03-20 NOTE — Patient Instructions (Signed)
Try meclizine for dizziness and nausea if needed  Caution of sedation  mucinex DM for cough if needed Watch for elevated temp  Alert [place] if symptoms worsen or change Go for a covid test tomorrow at [facility]  We will call when it returns

## 2019-03-20 NOTE — Progress Notes (Signed)
Virtual Visit via Telephone Note  I connected with Diane Delacruz on 03/20/19 at  2:30 PM EDT by telephone and verified that I am speaking with the correct person using two identifiers.  Location: Patient: home Provider: office    I discussed the limitations, risks, security and privacy concerns of performing an evaluation and management service by telephone and the availability of in person appointments. I also discussed with the patient that there may be a patient responsible charge related to this service. The patient expressed understanding and agreed to proceed.  Video was attempted today but failed , so remainder of visit was conducted by phone   History of Present Illness: Pt presents with dizziness and nausea   Yesterday am - light headed and dizzy  After she was up for a while -in the living room folding a blanket-lost balance a bit No falls  Not a spinning feeling  Does feel nauseated  No vomiting  Mild headache - top and back   Nothing makes it worse Nothing makes it better   A little cough -with some phlegm /wonders if she has sinus problems Phlegm is yellow /greenish  No sob or wheeze  It is allergy season  Scratchy throat -clears it a lot   Still uses allegra and nasonex   Ears itch - do not hurt or feel full No change in hearing   No h/o vertigo  Has had dizziness go away before   No other neuro symptoms  No head injury   Hydration  Well hydrated  Not out in the heat recently   Exposures -no illness exposures    Wt Readings from Last 3 Encounters:  03/20/19 145 lb (65.8 kg)  02/12/19 146 lb 5 oz (66.4 kg)  06/22/18 150 lb 4 oz (68.2 kg)   Temp: (!) 96.4 F (35.8 C)(pt reported)   no fever/aches  Gets cold from air conditioning  No change in taste or smell   Patient Active Problem List   Diagnosis Date Noted  . Dizziness 03/20/2019  . Viral URI with cough 03/20/2019  . Insect bite, infected 02/12/2019  . Cough 08/03/2017  . Encounter  for screening mammogram for breast cancer 06/21/2017  . Elevated glucose level 06/21/2017  . Estrogen deficiency 04/27/2015  . Routine general medical examination at a health care facility 04/19/2015  . Encounter for Medicare annual wellness exam 11/07/2012  . TMJ (temporomandibular joint syndrome) 02/24/2012  . Insomnia 02/21/2011  . Family history of colon cancer 11/01/2010  . Hyperlipidemia 11/01/2010  . Vitamin D deficiency 10/10/2008  . IRRITABLE BOWEL SYNDROME 10/15/2007  . VARICOSE VEINS, LOWER EXTREMITIES 08/15/2007  . Osteoporosis 05/14/2007  . ALLERGIC RHINITIS, SEASONAL 05/11/2007  . GERD 05/11/2007  . FIBROCYSTIC BREAST DISEASE 05/11/2007  . POSTMENOPAUSAL STATUS 05/11/2007   Past Medical History:  Diagnosis Date  . Allergic rhinitis due to pollen   . Allergy   . Anxiety    past hx  . Asymptomatic varicose veins   . Bladder tumor 11/05  . CAD (coronary artery disease)   . DDD (degenerative disc disease)    in neck, neurosurgeon Dr. Trenton Gammon  . Depression   . Diffuse cystic mastopathy   . Diverticulosis   . Dyspnea 2008   negative echo and MV scan  . Esophageal reflux   . History of gallstones   . Hypoglycemia, unspecified   . Irritable bowel syndrome   . Lumbago   . Palpitations 03/06/09   event monitor  . Pure hypercholesterolemia   .  Stress fracture of foot 03/28/99   left  . Symptomatic menopausal or female climacteric states   . Unspecified adverse effect of other drug, medicinal and biological substance(995.29)   . Unspecified vitamin D deficiency    Past Surgical History:  Procedure Laterality Date  . BLADDER SURGERY     tumor removed  . CHOLECYSTECTOMY     gallstones- ultrasound 10/03  . COLONOSCOPY     Social History   Tobacco Use  . Smoking status: Never Smoker  . Smokeless tobacco: Never Used  Substance Use Topics  . Alcohol use: Yes    Alcohol/week: 0.0 standard drinks    Comment: Rare-wine  . Drug use: No   Family History  Problem  Relation Age of Onset  . Hyperlipidemia Mother   . Stroke Mother   . Arthritis Mother   . Macular degeneration Mother   . Irritable bowel syndrome Mother   . Colon cancer Father 1       died 64  . Heart disease Father        MI's  . COPD Sister        heavy smoker  . Colon polyps Maternal Aunt   . Breast cancer Paternal Aunt   . Osteopenia Sister   . Other Daughter        celiac spruce  . Diabetes Brother   . Diabetes Paternal Uncle   . Irritable bowel syndrome Sister   . Kidney disease Cousin   . Rectal cancer Neg Hx   . Stomach cancer Neg Hx   . Esophageal cancer Neg Hx   . Liver cancer Neg Hx   . Pancreatic cancer Neg Hx   . Prostate cancer Neg Hx    Allergies  Allergen Reactions  . Amoxicillin     Rash and diarrhea   . Codeine     REACTION: unknown reaction  . Flexeril [Cyclobenzaprine]     sedation  . Klonopin [Clonazepam]     Dizziness , fatigue  . Nsaids     REACTION: diarrhea  . Omeprazole     REACTION: did not work for her  . Pseudoephedrine     REACTION: jittery  . Tessalon [Benzonatate] Rash    Rash   Current Outpatient Medications on File Prior to Visit  Medication Sig Dispense Refill  . acetaminophen (TYLENOL) 500 MG tablet Take 1,000 mg by mouth every 6 (six) hours as needed for mild pain.     . Calcium-Magnesium-Vitamin D (CALCIUM 1200+D3 PO) Take 1 tablet by mouth daily.    Marland Kitchen esomeprazole (NEXIUM) 40 MG capsule Take 1 capsule (40 mg total) by mouth daily at 12 noon. 30 capsule 11  . fexofenadine (ALLEGRA) 60 MG tablet Take 180 mg by mouth daily as needed for allergies.     . mometasone (NASONEX) 50 MCG/ACT nasal spray Place 2 sprays into the nose daily as needed (allergies). 17 g 11  . pravastatin (PRAVACHOL) 20 MG tablet Take 1 tablet (20 mg total) by mouth daily. 90 tablet 3   No current facility-administered medications on file prior to visit.    Review of Systems  Constitutional: Negative for chills, diaphoresis, fever and  malaise/fatigue.  HENT: Positive for congestion. Negative for ear discharge, ear pain, hearing loss, sinus pain, sore throat and tinnitus.   Eyes: Negative for blurred vision, discharge and redness.  Respiratory: Positive for cough and sputum production. Negative for hemoptysis, shortness of breath and wheezing.   Cardiovascular: Negative for chest pain and leg swelling.  Gastrointestinal: Positive for nausea. Negative for abdominal pain, diarrhea, heartburn and vomiting.  Genitourinary: Negative for dysuria.  Skin: Negative for rash.  Neurological: Positive for dizziness and headaches.      Observations/Objective: Pt sounds well , like her usual self Not in distress Voice is not hoarse  Slight cough noted, not sob with speech  Nl cognition Turns her head left /right/up /down (going up and down make her a little dizzy) =per report  Nl affect   Assessment and Plan: Problem List Items Addressed This Visit      Respiratory   Viral URI with cough - Primary    With some positional dizziness and nausea  Symptoms are not severe Meclizine sent to pharmacy- warned of sedation  Expectorant-DM for cough as needed  Enc to watch temperature covid test ordered tomorrow to get at the [facility] testing center inst to isolate herself until result/ symptoms are resolved Update if not starting to improve in a week or if worsening        Relevant Orders   Novel Coronavirus, NAA ([facility])     Other   Dizziness    Positional, mild with some nausea  No other neuro changes except mild HA In setting of viral uri /may be from inner ear  Meclizine sent -use prn with caution of sedation  Rest/fluids/ change position slowly  covid test ordered  Enc pt to call if symptoms worsen      Relevant Orders   Novel Coronavirus, NAA ([facility])       Follow Up Instructions: Try meclizine for dizziness and nausea if needed  Caution of sedation  mucinex DM for cough if needed Watch for elevated temp   Alert [place] if symptoms worsen or change Go for a covid test tomorrow at [facility]  We will call when it returns     I discussed the assessment and treatment plan with the patient. The patient was provided an opportunity to ask questions and all were answered. The patient agreed with the plan and demonstrated an understanding of the instructions.   The patient was advised to call back or seek an in-person evaluation if the symptoms worsen or if the condition fails to improve as anticipated.     Loura Pardon, MD

## 2019-03-20 NOTE — Assessment & Plan Note (Signed)
With some positional dizziness and nausea  Symptoms are not severe Meclizine sent to pharmacy- warned of sedation  Expectorant-DM for cough as needed  Enc to watch temperature covid test ordered tomorrow to get at the [facility] testing center inst to isolate herself until result/ symptoms are resolved Update if not starting to improve in a week or if worsening

## 2019-03-20 NOTE — Telephone Encounter (Signed)
Starting 2-3 days ago pt has nausea,dizziness, room does not spend; tired, H/a pain level 3-4 since 03/19/19.prod cough with green-yellow phlegm, pt scheduled virtual appt today at 2:30.

## 2019-03-21 ENCOUNTER — Other Ambulatory Visit: Payer: Self-pay

## 2019-03-21 DIAGNOSIS — R6889 Other general symptoms and signs: Secondary | ICD-10-CM | POA: Diagnosis not present

## 2019-03-21 DIAGNOSIS — Z20822 Contact with and (suspected) exposure to covid-19: Secondary | ICD-10-CM

## 2019-03-22 LAB — NOVEL CORONAVIRUS, NAA: SARS-CoV-2, NAA: NOT DETECTED

## 2019-06-17 ENCOUNTER — Telehealth: Payer: Self-pay | Admitting: Family Medicine

## 2019-06-17 DIAGNOSIS — E559 Vitamin D deficiency, unspecified: Secondary | ICD-10-CM

## 2019-06-17 DIAGNOSIS — Z0000 Encounter for general adult medical examination without abnormal findings: Secondary | ICD-10-CM

## 2019-06-17 DIAGNOSIS — M810 Age-related osteoporosis without current pathological fracture: Secondary | ICD-10-CM

## 2019-06-17 DIAGNOSIS — R7309 Other abnormal glucose: Secondary | ICD-10-CM

## 2019-06-17 DIAGNOSIS — E7800 Pure hypercholesterolemia, unspecified: Secondary | ICD-10-CM

## 2019-06-17 NOTE — Telephone Encounter (Signed)
-----   Message from Cloyd Stagers, RT sent at 06/12/2019  2:52 PM EST ----- Regarding: Lab Orders for Thursday 12.3.2020 Please place lab orders for, Thursday 12.3.2020 office visit for physical on Tuesday 12.8.2020 Thank you, Dyke Maes RT(R)

## 2019-06-20 ENCOUNTER — Ambulatory Visit: Payer: Medicare Other

## 2019-06-20 ENCOUNTER — Other Ambulatory Visit: Payer: Self-pay

## 2019-06-20 ENCOUNTER — Other Ambulatory Visit (INDEPENDENT_AMBULATORY_CARE_PROVIDER_SITE_OTHER): Payer: Medicare Other

## 2019-06-20 ENCOUNTER — Ambulatory Visit (INDEPENDENT_AMBULATORY_CARE_PROVIDER_SITE_OTHER): Payer: Medicare Other

## 2019-06-20 DIAGNOSIS — R7309 Other abnormal glucose: Secondary | ICD-10-CM

## 2019-06-20 DIAGNOSIS — E7800 Pure hypercholesterolemia, unspecified: Secondary | ICD-10-CM

## 2019-06-20 DIAGNOSIS — E559 Vitamin D deficiency, unspecified: Secondary | ICD-10-CM

## 2019-06-20 DIAGNOSIS — Z0000 Encounter for general adult medical examination without abnormal findings: Secondary | ICD-10-CM

## 2019-06-20 LAB — COMPREHENSIVE METABOLIC PANEL
ALT: 13 U/L (ref 0–35)
AST: 20 U/L (ref 0–37)
Albumin: 3.9 g/dL (ref 3.5–5.2)
Alkaline Phosphatase: 88 U/L (ref 39–117)
BUN: 13 mg/dL (ref 6–23)
CO2: 28 mEq/L (ref 19–32)
Calcium: 9.5 mg/dL (ref 8.4–10.5)
Chloride: 104 mEq/L (ref 96–112)
Creatinine, Ser: 0.91 mg/dL (ref 0.40–1.20)
GFR: 60.6 mL/min (ref >60.00)
Glucose, Bld: 107 mg/dL — ABNORMAL HIGH (ref 70–99)
Potassium: 4 mEq/L (ref 3.5–5.1)
Sodium: 139 mEq/L (ref 135–145)
Total Bilirubin: 0.4 mg/dL (ref 0.2–1.2)
Total Protein: 6.4 g/dL (ref 6.0–8.3)

## 2019-06-20 LAB — CBC WITH DIFFERENTIAL/PLATELET
Basophils Absolute: 0 10*3/uL (ref 0.0–0.1)
Basophils Relative: 0.9 % (ref 0.0–3.0)
Eosinophils Absolute: 0.3 10*3/uL (ref 0.0–0.7)
Eosinophils Relative: 6.1 % — ABNORMAL HIGH (ref 0.0–5.0)
HCT: 39 % (ref 36.0–46.0)
Hemoglobin: 13 g/dL (ref 12.0–15.0)
Lymphocytes Relative: 44.7 % (ref 12.0–46.0)
Lymphs Abs: 2.4 10*3/uL (ref 0.7–4.0)
MCHC: 33.4 g/dL (ref 30.0–36.0)
MCV: 93.5 fl (ref 78.0–100.0)
Monocytes Absolute: 0.4 10*3/uL (ref 0.1–1.0)
Monocytes Relative: 7.1 % (ref 3.0–12.0)
Neutro Abs: 2.2 10*3/uL (ref 1.4–7.7)
Neutrophils Relative %: 41.2 % — ABNORMAL LOW (ref 43.0–77.0)
Platelets: 296 10*3/uL (ref 150.0–400.0)
RBC: 4.17 Mil/uL (ref 3.87–5.11)
RDW: 12.6 % (ref 11.5–15.5)
WBC: 5.4 10*3/uL (ref 4.0–10.5)

## 2019-06-20 LAB — LIPID PANEL
Cholesterol: 185 mg/dL (ref 0–200)
HDL: 74.1 mg/dL (ref >39.00)
LDL Cholesterol: 95 mg/dL (ref 0–99)
NonHDL: 111.14
Total CHOL/HDL Ratio: 2
Triglycerides: 79 mg/dL (ref 0.0–149.0)
VLDL: 15.8 mg/dL (ref 0.0–40.0)

## 2019-06-20 LAB — HEMOGLOBIN A1C: Hgb A1c MFr Bld: 5.7 % (ref 4.6–6.5)

## 2019-06-20 LAB — VITAMIN D 25 HYDROXY (VIT D DEFICIENCY, FRACTURES): VITD: 38.12 ng/mL (ref 30.00–100.00)

## 2019-06-20 LAB — TSH: TSH: 2.41 u[IU]/mL (ref 0.35–4.50)

## 2019-06-20 NOTE — Progress Notes (Signed)
PCP notes:  Health Maintenance: Needs flu vaccine Will check with insurance about Shingrix  Abnormal Screenings: none   Patient concerns: none   Nurse concerns: none   Next PCP appt.: 06/25/2019 @ 2:30 pm

## 2019-06-20 NOTE — Progress Notes (Signed)
Subjective:   Diane Delacruz is a 73 y.o. female who presents for Medicare Annual (Subsequent) preventive examination.  Review of Systems: N/A   This visit is being conducted through telemedicine via telephone at the nurse health advisor's home address due to the COVID-19 pandemic. This patient has given me verbal consent via doximity to conduct this visit, patient states they are participating from their home address. Patient and myself are on the telephone call. There is no referral for this visit. Some vital signs may be absent or patient reported.    Patient identification: identified by name, DOB, and current address   Cardiac Risk Factors include: advanced age (>17men, >29 women);dyslipidemia     Objective:     Vitals: LMP 07/19/1995   There is no height or weight on file to calculate BMI.  Advanced Directives 06/20/2019 06/18/2018 06/12/2017 05/31/2016  Does Patient Have a Medical Advance Directive? No Yes No Yes  Type of Advance Directive - [place];Living will - [place];Living will  Does patient want to make changes to medical advance directive? - - - No - Patient declined  Copy of [place] in Chart? - - - No - copy requested  Would patient like information on creating a medical advance directive? Yes (MAU/Ambulatory/Procedural Areas - Information given) - No - Patient declined -    Tobacco Social History   Tobacco Use  Smoking Status Never Smoker  Smokeless Tobacco Never Used     Counseling given: Not Answered   Clinical Intake:  Pre-visit preparation completed: Yes  Pain : No/denies pain     Nutritional Risks: None Diabetes: No  How often do you need to have someone help you when you read instructions, pamphlets, or other written materials from your doctor or pharmacy?: 1 - Never What is the last grade level you completed in school?: 12th  Interpreter Needed?: No  Information entered by ::  CJohnson, LPN  Past Medical History:  Diagnosis Date  . Allergic rhinitis due to pollen   . Allergy   . Anxiety    past hx  . Asymptomatic varicose veins   . Bladder tumor 11/05  . CAD (coronary artery disease)   . DDD (degenerative disc disease)    in neck, neurosurgeon Dr. Trenton Gammon  . Depression   . Diffuse cystic mastopathy   . Diverticulosis   . Dyspnea 2008   negative echo and MV scan  . Esophageal reflux   . History of gallstones   . Hypoglycemia, unspecified   . Irritable bowel syndrome   . Lumbago   . Palpitations 03/06/09   event monitor  . Pure hypercholesterolemia   . Stress fracture of foot 03/28/99   left  . Symptomatic menopausal or female climacteric states   . Unspecified adverse effect of other drug, medicinal and biological substance(995.29)   . Unspecified vitamin D deficiency    Past Surgical History:  Procedure Laterality Date  . BLADDER SURGERY     tumor removed  . CHOLECYSTECTOMY     gallstones- ultrasound 10/03  . COLONOSCOPY     Family History  Problem Relation Age of Onset  . Hyperlipidemia Mother   . Stroke Mother   . Arthritis Mother   . Macular degeneration Mother   . Irritable bowel syndrome Mother   . Colon cancer Father 48       died 44  . Heart disease Father        MI's  .  COPD Sister        heavy smoker  . Colon polyps Maternal Aunt   . Breast cancer Paternal Aunt   . Osteopenia Sister   . Other Daughter        celiac spruce  . Diabetes Brother   . Diabetes Paternal Uncle   . Irritable bowel syndrome Sister   . Kidney disease Cousin   . Rectal cancer Neg Hx   . Stomach cancer Neg Hx   . Esophageal cancer Neg Hx   . Liver cancer Neg Hx   . Pancreatic cancer Neg Hx   . Prostate cancer Neg Hx    Social History   Socioeconomic History  . Marital status: Married    Spouse name: Not on file  . Number of children: 1  . Years of education: Not on file  . Highest education level: Not on file  Occupational History  .  Occupation: retired    Fish farm manager: unemployed  Social Needs  . Financial resource strain: Not hard at all  . Food insecurity    Worry: Never true    Inability: Never true  . Transportation needs    Medical: No    Non-medical: No  Tobacco Use  . Smoking status: Never Smoker  . Smokeless tobacco: Never Used  Substance and Sexual Activity  . Alcohol use: Yes    Alcohol/week: 0.0 standard drinks    Comment: Rare-wine  . Drug use: No  . Sexual activity: Never  Lifestyle  . Physical activity    Days per week: 0 days    Minutes per session: 0 min  . Stress: Not at all  Relationships  . Social Herbalist on phone: Not on file    Gets together: Not on file    Attends religious service: Not on file    Active member of club or organization: Not on file    Attends meetings of clubs or organizations: Not on file    Relationship status: Not on file  Other Topics Concern  . 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 acetaminophen (TYLENOL) 500 MG tablet Take 1,000 mg by mouth every 6 (six) hours as needed for mild pain.   . Calcium-Magnesium-Vitamin D (CALCIUM 1200+D3 PO) Take 1 tablet by mouth daily.  Marland Kitchen esomeprazole (NEXIUM) 40 MG capsule Take 1 capsule (40 mg total) by mouth daily at 12 noon.  . fexofenadine (ALLEGRA) 60 MG tablet Take 180 mg by mouth daily as needed for allergies.   Marland Kitchen meclizine (ANTIVERT) 25 MG tablet Take 1 tablet (25 mg total) by mouth 3 (three) times daily as needed for dizziness or nausea.  . mometasone (NASONEX) 50 MCG/ACT nasal spray Place 2 sprays into the nose daily as needed (allergies).  . pravastatin (PRAVACHOL) 20 MG tablet Take 1 tablet (20 mg total) by mouth daily.   No facility-administered encounter medications on file as of 06/20/2019.     Activities of Daily Living In your present state of health,  do you have any difficulty performing the following activities: 06/20/2019  Hearing? N  Vision? N  Difficulty concentrating or making decisions? N  Walking or climbing stairs? N  Dressing or bathing? N  Doing errands, shopping? N  Preparing Food and eating ? N  Using the Toilet? N  In the past six  months, have you accidently leaked urine? N  Do you have problems with loss of bowel control? N  Managing your Medications? N  Managing your Finances? N  Housekeeping or managing your Housekeeping? N  Some recent data might be hidden    Patient Care Team: Tower, Wynelle Fanny, MD as PCP - General Thelma Comp, [place] as Consulting Physician (Optometry) Sydnee Levans, MD as Consulting Physician (Dermatology)    Assessment:   This is a routine wellness examination for Talah.  Exercise Activities and Dietary recommendations Current Exercise Habits: The patient does not participate in regular exercise at present, Exercise limited by: None identified  Goals    . DIET - INCREASE WATER INTAKE     Starting 06/18/2018, I will attempt to drink 6-8 glasses of water daily.     . Patient Stated     06/20/2019, I will maintain and continue medications as prescribed.        Fall Risk Fall Risk  06/20/2019 06/18/2018 06/12/2017 05/31/2016 08/18/2015  Falls in the past year? 0 0 No No No  Number falls in past yr: 0 - - - -  Injury with Fall? 0 - - - -  Risk for fall due to : Medication side effect - - - -  Follow up Falls evaluation completed;Falls prevention discussed - - - -   Is the patient's home free of loose throw rugs in walkways, pet beds, electrical cords, etc?   yes      Grab bars in the bathroom? no      Handrails on the stairs?   no      Adequate lighting?   yes  Timed Get Up and Go performed: N/A  Depression Screen PHQ 2/9 Scores 06/20/2019 06/18/2018 06/12/2017 05/31/2016  PHQ - 2 Score 0 0 0 0  PHQ- 9 Score 0 0 0 -     Cognitive Function MMSE - Mini Mental State Exam  06/20/2019 06/18/2018 06/12/2017 05/31/2016  Orientation to time 5 5 5 5   Orientation to Place 5 5 5 5   Registration 3 3 3 3   Attention/ Calculation 5 0 0 0  Recall 3 3 3 3   Language- name 2 objects - 0 0 0  Language- repeat 1 1 1 1   Language- follow 3 step command - 3 3 3   Language- read & follow direction - 0 0 0  Write a sentence - 0 0 0  Copy design - 0 0 0  Total score - 20 20 20   Mini Cog  Mini-Cog screen was completed. Maximum score is 22. A value of 0 denotes this part of the MMSE was not completed or the patient failed this part of the Mini-Cog screening.       Immunization History  Administered Date(s) Administered  . Influenza Split 04/26/2011  . Influenza,inj,Quad PF,6+ Mos 04/25/2014, 04/27/2015, 05/02/2017, 04/13/2018  . Influenza-Unspecified 04/27/2016  . Pneumococcal Conjugate-13 04/25/2014  . Pneumococcal Polysaccharide-23 11/02/2011  . Td 11/11/2002, 02/12/2019    Qualifies for Shingles Vaccine? yes  Screening Tests Health Maintenance  Topic Date Due  . INFLUENZA VACCINE  02/16/2019  . MAMMOGRAM  08/01/2019  . COLONOSCOPY  10/06/2022  . TETANUS/TDAP  02/11/2029  . DEXA SCAN  Completed  . Hepatitis C Screening  Completed  . PNA vac Low Risk Adult  Completed    Cancer Screenings: Lung: Low Dose CT Chest recommended if Age 73-80 years, 30 pack-year currently smoking OR have quit w/in 15years. Patient does not qualify. Breast:  Up to  date on Mammogram? Yes, completed 07/31/2017   Up to date of Bone Density/Dexa? Yes, completed 07/31/2017 Colorectal: completed 10/05/2017  Additional Screenings:  Hepatitis C Screening: 05/24/2016     Plan:   Patient will maintain and continue medications as prescribed.   I have personally reviewed and noted the following in the patient's chart:   . Medical and social history . Use of alcohol, tobacco or illicit drugs  . Current medications and supplements . Functional ability and status . Nutritional status .  Physical activity . Advanced directives . List of other physicians . Hospitalizations, surgeries, and ER visits in previous 12 months . Vitals . Screenings to include cognitive, depression, and falls . Referrals and appointments  In addition, I have reviewed and discussed with patient certain preventive protocols, quality metrics, and best practice recommendations. A written personalized care plan for preventive services as well as general preventive health recommendations were provided to patient.     Andrez Grime, LPN  [id number]

## 2019-06-20 NOTE — Patient Instructions (Signed)
Diane Delacruz , Thank you for taking time to come for your Medicare Wellness Visit. I appreciate your ongoing commitment to your health goals. Please review the following plan we discussed and let me know if I can assist you in the future.   Screening recommendations/referrals: Colonoscopy: Up to date, completed 10/05/2017 Mammogram: Up to date, completed 07/31/2017 Bone Density: Up to date, completed 07/31/2017 Recommended yearly ophthalmology/optometry visit for glaucoma screening and checkup Recommended yearly dental visit for hygiene and checkup  Vaccinations: Influenza vaccine: will get at physical Pneumococcal vaccine: Completed series Tdap vaccine: Up to date, completed 02/12/2019 Shingles vaccine: will check with insurance    Advanced directives: Advance directive discussed with you today. I have provided a copy for you to complete at home and have notarized. Once this is complete please bring a copy in to our office so we can scan it into your chart.  Conditions/risks identified: hyperlipidemia  Next appointment: 06/25/2019 @ 2:30 pm    Preventive Care 65 Years and Older, Female Preventive care refers to lifestyle choices and visits with your health care provider that can promote health and wellness. What does preventive care include?  A yearly physical exam. This is also called an annual well check.  Dental exams once or twice a year.  Routine eye exams. Ask your health care provider how often you should have your eyes checked.  Personal lifestyle choices, including:  Daily care of your teeth and gums.  Regular physical activity.  Eating a healthy diet.  Avoiding tobacco and drug use.  Limiting alcohol use.  Practicing safe sex.  Taking low-dose aspirin every day.  Taking vitamin and mineral supplements as recommended by your health care provider. What happens during an annual well check? The services and screenings done by your health care provider during your  annual well check will depend on your age, overall health, lifestyle risk factors, and family history of disease. Counseling  Your health care provider may ask you questions about your:  Alcohol use.  Tobacco use.  Drug use.  Emotional well-being.  Home and relationship well-being.  Sexual activity.  Eating habits.  History of falls.  Memory and ability to understand (cognition).  Work and work Statistician.  Reproductive health. Screening  You may have the following tests or measurements:  Height, weight, and BMI.  Blood pressure.  Lipid and cholesterol levels. These may be checked every 5 years, or more frequently if you are over 58 years old.  Skin check.  Lung cancer screening. You may have this screening every year starting at age 81 if you have a 30-pack-year history of smoking and currently smoke or have quit within the past 15 years.  Fecal occult blood test (FOBT) of the stool. You may have this test every year starting at age 57.  Flexible sigmoidoscopy or colonoscopy. You may have a sigmoidoscopy every 5 years or a colonoscopy every 10 years starting at age 81.  Hepatitis C blood test.  Hepatitis B blood test.  Sexually transmitted disease (STD) testing.  Diabetes screening. This is done by checking your blood sugar (glucose) after you have not eaten for a while (fasting). You may have this done every 1-3 years.  Bone density scan. This is done to screen for osteoporosis. You may have this done starting at age 75.  Mammogram. This may be done every 1-2 years. Talk to your health care provider about how often you should have regular mammograms. Talk with your health care provider about your test  results, treatment options, and if necessary, the need for more tests. Vaccines  Your health care provider may recommend certain vaccines, such as:  Influenza vaccine. This is recommended every year.  Tetanus, diphtheria, and acellular pertussis (Tdap, Td)  vaccine. You may need a Td booster every 10 years.  Zoster vaccine. You may need this after age 70.  Pneumococcal 13-valent conjugate (PCV13) vaccine. One dose is recommended after age 68.  Pneumococcal polysaccharide (PPSV23) vaccine. One dose is recommended after age 64. Talk to your health care provider about which screenings and vaccines you need and how often you need them. This information is not intended to replace advice given to you by your health care provider. Make sure you discuss any questions you have with your health care provider. Document Released: 07/31/2015 Document Revised: 03/23/2016 Document Reviewed: 05/05/2015 Elsevier Interactive Patient Education  2017 [employer] Prevention in the Home Falls can cause injuries. They can happen to people of all ages. There are many things you can do to make your home safe and to help prevent falls. What can I do on the outside of my home?  Regularly fix the edges of walkways and driveways and fix any cracks.  Remove anything that might make you trip as you walk through a door, such as a raised step or threshold.  Trim any bushes or trees on the path to your home.  Use bright outdoor lighting.  Clear any walking paths of anything that might make someone trip, such as rocks or tools.  Regularly check to see if handrails are loose or broken. Make sure that both sides of any steps have handrails.  Any raised decks and porches should have guardrails on the edges.  Have any leaves, snow, or ice cleared regularly.  Use sand or salt on walking paths during winter.  Clean up any spills in your garage right away. This includes oil or grease spills. What can I do in the bathroom?  Use night lights.  Install grab bars by the toilet and in the tub and shower. Do not use towel bars as grab bars.  Use non-skid mats or decals in the tub or shower.  If you need to sit down in the shower, use a plastic, non-slip stool.   Keep the floor dry. Clean up any water that spills on the floor as soon as it happens.  Remove soap buildup in the tub or shower regularly.  Attach bath mats securely with double-sided non-slip rug tape.  Do not have throw rugs and other things on the floor that can make you trip. What can I do in the bedroom?  Use night lights.  Make sure that you have a light by your bed that is easy to reach.  Do not use any sheets or blankets that are too big for your bed. They should not hang down onto the floor.  Have a firm chair that has side arms. You can use this for support while you get dressed.  Do not have throw rugs and other things on the floor that can make you trip. What can I do in the kitchen?  Clean up any spills right away.  Avoid walking on wet floors.  Keep items that you use a lot in easy-to-reach places.  If you need to reach something above you, use a strong step stool that has a grab bar.  Keep electrical cords out of the way.  Do not use floor polish or wax that makes  floors slippery. If you must use wax, use non-skid floor wax.  Do not have throw rugs and other things on the floor that can make you trip. What can I do with my stairs?  Do not leave any items on the stairs.  Make sure that there are handrails on both sides of the stairs and use them. Fix handrails that are broken or loose. Make sure that handrails are as long as the stairways.  Check any carpeting to make sure that it is firmly attached to the stairs. Fix any carpet that is loose or worn.  Avoid having throw rugs at the top or bottom of the stairs. If you do have throw rugs, attach them to the floor with carpet tape.  Make sure that you have a light switch at the top of the stairs and the bottom of the stairs. If you do not have them, ask someone to add them for you. What else can I do to help prevent falls?  Wear shoes that:  Do not have high heels.  Have rubber bottoms.  Are  comfortable and fit you well.  Are closed at the toe. Do not wear sandals.  If you use a stepladder:  Make sure that it is fully opened. Do not climb a closed stepladder.  Make sure that both sides of the stepladder are locked into place.  Ask someone to hold it for you, if possible.  Clearly mark and make sure that you can see:  Any grab bars or handrails.  First and last steps.  Where the edge of each step is.  Use tools that help you move around (mobility aids) if they are needed. These include:  Canes.  Walkers.  Scooters.  Crutches.  Turn on the lights when you go into a dark area. Replace any light bulbs as soon as they burn out.  Set up your furniture so you have a clear path. Avoid moving your furniture around.  If any of your floors are uneven, fix them.  If there are any pets around you, be aware of where they are.  Review your medicines with your doctor. Some medicines can make you feel dizzy. This can increase your chance of falling. Ask your doctor what other things that you can do to help prevent falls. This information is not intended to replace advice given to you by your health care provider. Make sure you discuss any questions you have with your health care provider. Document Released: 04/30/2009 Document Revised: 12/10/2015 Document Reviewed: 08/08/2014 Elsevier Interactive Patient Education  2017 [employer].

## 2019-06-24 ENCOUNTER — Other Ambulatory Visit: Payer: Self-pay

## 2019-06-25 ENCOUNTER — Encounter: Payer: Self-pay | Admitting: Family Medicine

## 2019-06-25 ENCOUNTER — Ambulatory Visit (INDEPENDENT_AMBULATORY_CARE_PROVIDER_SITE_OTHER): Payer: Medicare Other | Admitting: Family Medicine

## 2019-06-25 VITALS — BP 104/62 | HR 66 | Temp 97.0°F | Ht 61.5 in | Wt 149.7 lb

## 2019-06-25 DIAGNOSIS — Z1231 Encounter for screening mammogram for malignant neoplasm of breast: Secondary | ICD-10-CM

## 2019-06-25 DIAGNOSIS — E7800 Pure hypercholesterolemia, unspecified: Secondary | ICD-10-CM

## 2019-06-25 DIAGNOSIS — Z23 Encounter for immunization: Secondary | ICD-10-CM

## 2019-06-25 DIAGNOSIS — E2839 Other primary ovarian failure: Secondary | ICD-10-CM

## 2019-06-25 DIAGNOSIS — R7309 Other abnormal glucose: Secondary | ICD-10-CM

## 2019-06-25 DIAGNOSIS — E559 Vitamin D deficiency, unspecified: Secondary | ICD-10-CM

## 2019-06-25 DIAGNOSIS — M810 Age-related osteoporosis without current pathological fracture: Secondary | ICD-10-CM | POA: Diagnosis not present

## 2019-06-25 DIAGNOSIS — Z0000 Encounter for general adult medical examination without abnormal findings: Secondary | ICD-10-CM | POA: Diagnosis not present

## 2019-06-25 MED ORDER — PRAVASTATIN SODIUM 20 MG PO TABS: 20.0000 mg | ORAL_TABLET | Freq: Every day | ORAL | 3 refills | Status: DC

## 2019-06-25 NOTE — Assessment & Plan Note (Signed)
Nl breast exam  Encouraged self exams  Ref made for routine mammogram-pt will schedule this herself at the breast center

## 2019-06-25 NOTE — Assessment & Plan Note (Signed)
Disc goals for lipids and reasons to control them Rev last labs with pt Rev low sat fat diet in detail Good control with pravastatin and diet  HDL remains high in the 70s

## 2019-06-25 NOTE — Assessment & Plan Note (Signed)
Vitamin D level is therapeutic with current supplementation Disc importance of this to bone and overall health Level of 38 In setting of known OP

## 2019-06-25 NOTE — Progress Notes (Signed)
Subjective:    Patient ID: Diane Delacruz, female    DOB: [birth year], 73 y.o.   MRN: [id number]  This visit occurred during the SARS-CoV-2 public health emergency.  Safety protocols were in place, including screening questions prior to the visit, additional usage of staff PPE, and extensive cleaning of exam room while observing appropriate contact time as indicated for disinfecting solutions.    HPI Here for health maintenance exam and to review chronic medical problems   Doing ok overall  Has a big family- unable to see them and that is hard   Wt Readings from Last 3 Encounters:  06/25/19 149 lb 11.2 oz (67.9 kg)  03/20/19 145 lb (65.8 kg)  02/12/19 146 lb 5 oz (66.4 kg)  wt is up 4 lb  27.83 kg/m  Has been feeling ok  Taking care of herself  No exercise right now -knows she needs to start   Had amw on 06/20/19 Noted that she needs flu vaccine-wants that today  Also discussed shingrix vaccine   Mammogram 1/19 - due for that  - breast center  Self breast exam -no lumps   Colonoscopy 3/19 with 5 y recall  Her father had colon cancer in his 64s   dexa 1/19 - OP worst at [facility] Wants to schedule that  She takes ppi Was given info on raloxifine and alendronate in the past and declined tx  Falls -none Fractures-none (did have a foot injury left)  Supplements - taking D and Ca  D level is 38.1 Exercise -none   BP Readings from Last 3 Encounters:  06/25/19 104/62  02/12/19 116/60  06/22/18 118/62   Pulse Readings from Last 3 Encounters:  06/25/19 66  02/12/19 83  06/22/18 68     H/o elevated glucose  Lab Results  Component Value Date   HGBA1C 5.7 06/20/2019   Stable   Hyperlipidemia  Lab Results  Component Value Date   CHOL 185 06/20/2019   CHOL 173 06/18/2018   CHOL 171 06/12/2017   Lab Results  Component Value Date   HDL 74.10 06/20/2019   HDL 70.00 06/18/2018   HDL 70.10 06/12/2017   Lab Results  Component Value Date   LDLCALC 95 06/20/2019    LDLCALC 90 06/18/2018   LDLCALC 90 06/12/2017   Lab Results  Component Value Date   TRIG 79.0 06/20/2019   TRIG 62.0 06/18/2018   TRIG 56.0 06/12/2017   Lab Results  Component Value Date   CHOLHDL 2 06/20/2019   CHOLHDL 2 06/18/2018   CHOLHDL 2 06/12/2017   Lab Results  Component Value Date   LDLDIRECT 170.5 04/30/2012   LDLDIRECT 144.6 10/31/2011   LDLDIRECT 137.0 12/01/2010  taking pravastatin  Stable  Diet is so /so   Lab Results  Component Value Date   WBC 5.4 06/20/2019   HGB 13.0 06/20/2019   HCT 39.0 06/20/2019   MCV 93.5 06/20/2019   PLT 296.0 06/20/2019    Lab Results  Component Value Date   TSH 2.41 06/20/2019    Lab Results  Component Value Date   CREATININE 0.91 06/20/2019   BUN 13 06/20/2019   NA 139 06/20/2019   K 4.0 06/20/2019   CL 104 06/20/2019   CO2 28 06/20/2019   Lab Results  Component Value Date   ALT 13 06/20/2019   AST 20 06/20/2019   ALKPHOS 88 06/20/2019   BILITOT 0.4 06/20/2019     Patient Active Problem List   Diagnosis Date  Noted  . Dizziness 03/20/2019  . Cough 08/03/2017  . Encounter for screening mammogram for breast cancer 06/21/2017  . Elevated glucose level 06/21/2017  . Estrogen deficiency 04/27/2015  . Routine general medical examination at a health care facility 04/19/2015  . Encounter for Medicare annual wellness exam 11/07/2012  . TMJ (temporomandibular joint syndrome) 02/24/2012  . Insomnia 02/21/2011  . Family history of colon cancer 11/01/2010  . Hyperlipidemia 11/01/2010  . Vitamin D deficiency 10/10/2008  . IRRITABLE BOWEL SYNDROME 10/15/2007  . VARICOSE VEINS, LOWER EXTREMITIES 08/15/2007  . Osteoporosis 05/14/2007  . ALLERGIC RHINITIS, SEASONAL 05/11/2007  . GERD 05/11/2007  . FIBROCYSTIC BREAST DISEASE 05/11/2007  . POSTMENOPAUSAL STATUS 05/11/2007   Past Medical History:  Diagnosis Date  . Allergic rhinitis due to pollen   . Allergy   . Anxiety    past hx  . Asymptomatic varicose veins    . Bladder tumor 11/05  . CAD (coronary artery disease)   . DDD (degenerative disc disease)    in neck, neurosurgeon Dr. Trenton Gammon  . Depression   . Diffuse cystic mastopathy   . Diverticulosis   . Dyspnea 2008   negative echo and MV scan  . Esophageal reflux   . History of gallstones   . Hypoglycemia, unspecified   . Irritable bowel syndrome   . Lumbago   . Palpitations 03/06/09   event monitor  . Pure hypercholesterolemia   . Stress fracture of foot 03/28/99   left  . Symptomatic menopausal or female climacteric states   . Unspecified adverse effect of other drug, medicinal and biological substance(995.29)   . Unspecified vitamin D deficiency    Past Surgical History:  Procedure Laterality Date  . BLADDER SURGERY     tumor removed  . CHOLECYSTECTOMY     gallstones- ultrasound 10/03  . COLONOSCOPY     Social History   Tobacco Use  . Smoking status: Never Smoker  . Smokeless tobacco: Never Used  Substance Use Topics  . Alcohol use: Yes    Alcohol/week: 0.0 standard drinks    Comment: Rare-wine  . Drug use: No   Family History  Problem Relation Age of Onset  . Hyperlipidemia Mother   . Stroke Mother   . Arthritis Mother   . Macular degeneration Mother   . Irritable bowel syndrome Mother   . Colon cancer Father 52       died 88  . Heart disease Father        MI's  . COPD Sister        heavy smoker  . Colon polyps Maternal Aunt   . Breast cancer Paternal Aunt   . Osteopenia Sister   . Other Daughter        celiac spruce  . Diabetes Brother   . Diabetes Paternal Uncle   . Irritable bowel syndrome Sister   . Kidney disease Cousin   . Rectal cancer Neg Hx   . Stomach cancer Neg Hx   . Esophageal cancer Neg Hx   . Liver cancer Neg Hx   . Pancreatic cancer Neg Hx   . Prostate cancer Neg Hx    Allergies  Allergen Reactions  . Amoxicillin     Rash and diarrhea   . Codeine     REACTION: unknown reaction  . Flexeril [Cyclobenzaprine]     sedation  .  Klonopin [Clonazepam]     Dizziness , fatigue  . Nsaids     REACTION: diarrhea  . Omeprazole  REACTION: did not work for her  . Pseudoephedrine     REACTION: jittery  . Tessalon [Benzonatate] Rash    Rash   Current Outpatient Medications on File Prior to Visit  Medication Sig Dispense Refill  . acetaminophen (TYLENOL) 500 MG tablet Take 1,000 mg by mouth every 6 (six) hours as needed for mild pain.     . Calcium-Magnesium-Vitamin D (CALCIUM 1200+D3 PO) Take 1 tablet by mouth daily.    Marland Kitchen esomeprazole (NEXIUM) 40 MG capsule Take 1 capsule (40 mg total) by mouth daily at 12 noon. 30 capsule 11  . fexofenadine (ALLEGRA) 60 MG tablet Take 180 mg by mouth daily as needed for allergies.     . mometasone (NASONEX) 50 MCG/ACT nasal spray Place 2 sprays into the nose daily as needed (allergies). 17 g 11   No current facility-administered medications on file prior to visit.     Review of Systems  Constitutional: Negative for activity change, appetite change, fatigue, fever and unexpected weight change.  HENT: Negative for congestion, ear pain, rhinorrhea, sinus pressure and sore throat.   Eyes: Negative for pain, redness and visual disturbance.  Respiratory: Negative for cough, shortness of breath and wheezing.   Cardiovascular: Negative for chest pain and palpitations.  Gastrointestinal: Negative for abdominal pain, blood in stool, constipation and diarrhea.  Endocrine: Negative for polydipsia and polyuria.  Genitourinary: Negative for dysuria, frequency and urgency.  Musculoskeletal: Negative for arthralgias, back pain and myalgias.       L foot hurts laterally after an injury  Skin: Negative for pallor and rash.  Allergic/Immunologic: Negative for environmental allergies.  Neurological: Negative for dizziness, syncope and headaches.  Hematological: Negative for adenopathy. Does not bruise/bleed easily.  Psychiatric/Behavioral: Negative for decreased concentration and dysphoric mood.  The patient is not nervous/anxious.        Objective:   Physical Exam Constitutional:      General: She is not in acute distress.    Appearance: Normal appearance. She is well-developed and normal weight. She is not ill-appearing or diaphoretic.  HENT:     Head: Normocephalic and atraumatic.     Right Ear: Tympanic membrane, ear canal and external ear normal.     Left Ear: Tympanic membrane, ear canal and external ear normal.     Nose: Nose normal. No congestion.     Mouth/Throat:     Mouth: Mucous membranes are moist.     Pharynx: Oropharynx is clear. No posterior oropharyngeal erythema.  Eyes:     General: No scleral icterus.    Extraocular Movements: Extraocular movements intact.     Conjunctiva/sclera: Conjunctivae normal.     Pupils: Pupils are equal, round, and reactive to light.  Neck:     Musculoskeletal: Normal range of motion and neck supple. No neck rigidity or muscular tenderness.     Thyroid: No thyromegaly.     Vascular: No carotid bruit or JVD.  Cardiovascular:     Rate and Rhythm: Normal rate and regular rhythm.     Pulses: Normal pulses.     Heart sounds: Normal heart sounds. No gallop.      Comments: Some compressible LE varicosities  Pulmonary:     Effort: Pulmonary effort is normal. No respiratory distress.     Breath sounds: Normal breath sounds. No wheezing.     Comments: Good air exch Chest:     Chest wall: No tenderness.  Abdominal:     General: Bowel sounds are normal. There is no distension or  abdominal bruit.     Palpations: Abdomen is soft. There is no mass.     Tenderness: There is no abdominal tenderness.     Hernia: No hernia is present.  Genitourinary:    Comments: Breast exam: No mass, nodules, thickening, tenderness, bulging, retraction, inflamation, nipple discharge or skin changes noted.  No axillary or clavicular LA.     Musculoskeletal: Normal range of motion.        General: No tenderness.     Right lower leg: No edema.     Left  lower leg: No edema.     Comments: No kyphosis   Lymphadenopathy:     Cervical: No cervical adenopathy.  Skin:    General: Skin is warm and dry.     Coloration: Skin is not pale.     Findings: No erythema or rash.     Comments: Solar lentigines diffusely   Some SKs  Neurological:     Mental Status: She is alert. Mental status is at baseline.     Cranial Nerves: No cranial nerve deficit.     Motor: No abnormal muscle tone.     Coordination: Coordination normal.     Gait: Gait normal.     Deep Tendon Reflexes: Reflexes are normal and symmetric. Reflexes normal.  Psychiatric:        Mood and Affect: Mood normal.        Cognition and Memory: Cognition and memory normal.           Assessment & Plan:   Problem List Items Addressed This Visit      Musculoskeletal and Integument   Osteoporosis    Due for 2 y dexa in January Pt has declined tx thus far  No falls  Had a foot injury kicking something in the yard-did not think it was fractured Taking ca and D with D level of 38.1 No exercise- outlined options for wt bearing exercise at home  Disc fall prevention         Other   Vitamin D deficiency    Vitamin D level is therapeutic with current supplementation Disc importance of this to bone and overall health Level of 38 In setting of known OP      Hyperlipidemia    Disc goals for lipids and reasons to control them Rev last labs with pt Rev low sat fat diet in detail Good control with pravastatin and diet  HDL remains high in the 70s        Relevant Medications   pravastatin (PRAVACHOL) 20 MG tablet   Routine general medical examination at a health care facility - Primary    Reviewed health habits including diet and exercise and skin cancer prevention Reviewed appropriate screening tests for age  Also reviewed health mt list, fam hx and immunization status , as well as social and family history   See HPI amw reviewed  Flu vaccine given  Pt will check on  coverage of shingrix  Mammogram and dexa referrals done-pt will schedule the appt utd colonoscopy  Strongly encouraged a  Home exercise program for fitness        Estrogen deficiency   Relevant Orders   DG Bone Density   Encounter for screening mammogram for breast cancer    Nl breast exam  Encouraged self exams  Ref made for routine mammogram-pt will schedule this herself at the breast center       Relevant Orders   MM 3D SCREEN BREAST BILATERAL  Elevated glucose level    Lab Results  Component Value Date   HGBA1C 5.7 06/20/2019   disc imp of low glycemic diet and wt loss to prevent DM2        Other Visit Diagnoses    Need for immunization against influenza       Relevant Orders   Flu Vaccine QUAD High Dose(Fluad) (Completed)

## 2019-06-25 NOTE — Patient Instructions (Addendum)
For exercise -  Walking outside / yard work/biking when weather permits Consider exercise equipment at home  You can stream many exercise programs  - with a yoga mat   Goal is 30 or more minutes a day -work up to that   Call the [facility] to schedule your mammogram and dexa  [phone]  Dr Lorelei Pont is our sport med doctor- you can make an appt with him for your foot   For cholesterol  Avoid red meat/ fried foods/ egg yolks/ fatty breakfast meats/ butter, cheese and high fat dairy/ and shellfish

## 2019-06-25 NOTE — Assessment & Plan Note (Signed)
Due for 2 y dexa in January Pt has declined tx thus far  No falls  Had a foot injury kicking something in the yard-did not think it was fractured Taking ca and D with D level of 38.1 No exercise- outlined options for wt bearing exercise at home  Disc fall prevention

## 2019-06-25 NOTE — Assessment & Plan Note (Signed)
Lab Results  Component Value Date   HGBA1C 5.7 06/20/2019   disc imp of low glycemic diet and wt loss to prevent DM2

## 2019-06-25 NOTE — Assessment & Plan Note (Signed)
Reviewed health habits including diet and exercise and skin cancer prevention Reviewed appropriate screening tests for age  Also reviewed health mt list, fam hx and immunization status , as well as social and family history   See HPI amw reviewed  Flu vaccine given  Pt will check on coverage of shingrix  Mammogram and dexa referrals done-pt will schedule the appt utd colonoscopy  Strongly encouraged a  Home exercise program for fitness

## 2019-09-24 ENCOUNTER — Encounter: Payer: Self-pay | Admitting: Primary Care

## 2019-09-24 ENCOUNTER — Other Ambulatory Visit: Payer: Self-pay

## 2019-09-24 ENCOUNTER — Ambulatory Visit (INDEPENDENT_AMBULATORY_CARE_PROVIDER_SITE_OTHER): Payer: Medicare Other | Admitting: Primary Care

## 2019-09-24 DIAGNOSIS — J209 Acute bronchitis, unspecified: Secondary | ICD-10-CM | POA: Diagnosis not present

## 2019-09-24 MED ORDER — HYDROCODONE-HOMATROPINE 5-1.5 MG/5ML PO SYRP: 5.0000 mL | ORAL_SOLUTION | Freq: Three times a day (TID) | ORAL | 0 refills | Status: DC | PRN

## 2019-09-24 MED ORDER — DOXYCYCLINE HYCLATE 100 MG PO TABS: 100.0000 mg | ORAL_TABLET | Freq: Two times a day (BID) | ORAL | 0 refills | Status: DC

## 2019-09-24 NOTE — Assessment & Plan Note (Signed)
Persistent cough x 3-4 weeks, progressing. Today she appears stable, is compliant to her Nexium and Allegra. Low risk for Covid-19 overall, has no specific symptoms. Given persistent cough that is now progressing, daily compliance to PPI and antihistamine, we will treat for likely bacterial involvement. Likely bronchitis, will cover for CAP.  Rx for Doxycycline course sent to pharmacy. She is also requesting Hycodan, I see that she's had this on numerous occasions in the past. Will send to pharmacy.  Return precautions provided.

## 2019-09-24 NOTE — Patient Instructions (Signed)
Start Doxycycline antibiotic for the infection. Take 1 tablet by mouth twice daily for 10 days.  You can take the Hycodan cough medication every 8 hours as needed for cough. This may cause drowsiness.   Please update [place] in a few days if no improvement.   It was a pleasure meeting you! Allie Bossier, NP-C

## 2019-09-24 NOTE — Progress Notes (Signed)
Subjective:    Patient ID: Diane Delacruz, female    DOB: [birth year], 74 y.o.   MRN: [id number]  HPI  Virtual Visit via Video Note  I connected with Diane Delacruz on 09/24/19 at  9:00 AM EST by a video enabled telemedicine application and verified that I am speaking with the correct person using two identifiers.  Location: Patient: Home Provider: Office   I discussed the limitations of evaluation and management by telemedicine and the availability of in person appointments. The patient expressed understanding and agreed to proceed.  History of Present Illness:  Diane Delacruz is a 74 year old female with a history of allergic rhinitis, GERD, dizziness who presents today with a chief complaint of cough.  Other symptoms include nasal congestion, fatigue, decrease in appetite, persistent cough that has progressed. Symptoms began about 3-4 weeks ago.   She is compliant to her Allegra and Nexium daily. She's also taken Tylenol Cold, Tylenol Sinus, Robitussin with temporary improvement. She denies fevers, known exposure to Covid-19, loss of taste smell, diarrhea. She is very careful and avoids public places, orders groceries online.    Observations/Objective:  Alert and oriented. Appears tired but stable. No distress. Speaking in complete sentences. Dry cough during exam  Assessment and Plan:  Persistent cough x 3-4 weeks, progressing. Today she appears stable, is compliant to her Nexium and Allegra. Low risk for Covid-19 overall, has no specific symptoms. Given persistent cough that is now progressing, daily compliance to PPI and antihistamine, we will treat for likely bacterial involvement. Likely bronchitis, will cover for CAP.  Rx for Doxycycline course sent to pharmacy. She is also requesting Hycodan, I see that she's had this on numerous occasions in the past. Will send to pharmacy.  Return precautions provided.   Follow Up Instructions:  Start Doxycycline antibiotic  for the infection. Take 1 tablet by mouth twice daily for 10 days.  You can take the Hycodan cough medication every 8 hours as needed for cough. This may cause drowsiness.   Please update [place] in a few days if no improvement.   It was a pleasure meeting you! Allie Bossier, NP-C    I discussed the assessment and treatment plan with the patient. The patient was provided an opportunity to ask questions and all were answered. The patient agreed with the plan and demonstrated an understanding of the instructions.   The patient was advised to call back or seek an in-person evaluation if the symptoms worsen or if the condition fails to improve as anticipated.    Pleas Koch, NP    Review of Systems  Constitutional: Positive for appetite change and fatigue. Negative for fever.  HENT: Positive for congestion. Negative for postnasal drip and sore throat.   Respiratory: Positive for cough. Negative for shortness of breath.   Gastrointestinal: Negative for diarrhea.  Neurological: Negative for headaches.       Past Medical History:  Diagnosis Date  . Allergic rhinitis due to pollen   . Allergy   . Anxiety    past hx  . Asymptomatic varicose veins   . Bladder tumor 11/05  . CAD (coronary artery disease)   . DDD (degenerative disc disease)    in neck, neurosurgeon Dr. Trenton Gammon  . Depression   . Diffuse cystic mastopathy   . Diverticulosis   . Dyspnea 2008   negative echo and MV scan  . Esophageal reflux   . History of gallstones   . Hypoglycemia, unspecified   .  Irritable bowel syndrome   . Lumbago   . Palpitations 03/06/09   event monitor  . Pure hypercholesterolemia   . Stress fracture of foot 03/28/99   left  . Symptomatic menopausal or female climacteric states   . Unspecified adverse effect of other drug, medicinal and biological substance(995.29)   . Unspecified vitamin D deficiency      Social History   Socioeconomic History  . Marital status: Married    Spouse  name: Not on file  . Number of children: 1  . Years of education: Not on file  . Highest education level: Not on file  Occupational History  . Occupation: retired    Fish farm manager: unemployed  Tobacco Use  . Smoking status: Never Smoker  . Smokeless tobacco: Never Used  Substance and Sexual Activity  . Alcohol use: Yes    Alcohol/week: 0.0 standard drinks    Comment: Rare-wine  . Drug use: No  . Sexual activity: Never  Other Topics Concern  . 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 Difficulty of Paying Living Expenses: Not hard at all  Food Insecurity: No Food Insecurity  . Worried About Charity fundraiser in the Last Year: Never true  . Ran Out of Food in the Last Year: Never true  Transportation Needs: No Transportation Needs  . Lack of Transportation (Medical): No  . Lack of Transportation (Non-Medical): No  Physical Activity: Inactive  . Days of Exercise per Week: 0 days  . Minutes of Exercise per Session: 0 min  Stress: No Stress Concern Present  . Feeling of Stress : Not at all  Social Connections:   . Frequency of Communication with Friends and Family: Not on file  . Frequency of Social Gatherings with Friends and Family: Not on file  . Attends Religious Services: Not on file  . Active Member of Clubs or Organizations: Not on file  . Attends Archivist Meetings: Not on file  . Marital Status: Not on file  Intimate Partner Violence: Not At Risk  . Fear of Current or Ex-Partner: No  . Emotionally Abused: No  . Physically Abused: No  . Sexually Abused: No    Past Surgical History:  Procedure Laterality Date  . BLADDER SURGERY     tumor removed  . CHOLECYSTECTOMY     gallstones- ultrasound 10/03  . COLONOSCOPY      Family History  Problem Relation Age of Onset  . Hyperlipidemia Mother   . Stroke Mother    . Arthritis Mother   . Macular degeneration Mother   . Irritable bowel syndrome Mother   . Colon cancer Father 47       died 80  . Heart disease Father        MI's  . COPD Sister        heavy smoker  . Colon polyps Maternal Aunt   . Breast cancer Paternal Aunt   . Osteopenia Sister   . Other Daughter        celiac spruce  . Diabetes Brother   . Diabetes Paternal Uncle   . Irritable bowel syndrome Sister   . Kidney disease Cousin   . Rectal cancer Neg Hx   . Stomach cancer Neg Hx   . Esophageal cancer Neg Hx   . Liver cancer Neg  Hx   . Pancreatic cancer Neg Hx   . Prostate cancer Neg Hx     Allergies  Allergen Reactions  . Amoxicillin     Rash and diarrhea   . Codeine     REACTION: unknown reaction  . Flexeril [Cyclobenzaprine]     sedation  . Klonopin [Clonazepam]     Dizziness , fatigue  . Nsaids     REACTION: diarrhea  . Omeprazole     REACTION: did not work for her  . Pseudoephedrine     REACTION: jittery  . Tessalon [Benzonatate] Rash    Rash    Current Outpatient Medications on File Prior to Visit  Medication Sig Dispense Refill  . acetaminophen (TYLENOL) 500 MG tablet Take 1,000 mg by mouth every 6 (six) hours as needed for mild pain.     . Calcium-Magnesium-Vitamin D (CALCIUM 1200+D3 PO) Take 1 tablet by mouth daily.    Marland Kitchen esomeprazole (NEXIUM) 40 MG capsule Take 1 capsule (40 mg total) by mouth daily at 12 noon. 30 capsule 11  . fexofenadine (ALLEGRA) 60 MG tablet Take 180 mg by mouth daily as needed for allergies.     . mometasone (NASONEX) 50 MCG/ACT nasal spray Place 2 sprays into the nose daily as needed (allergies). 17 g 11  . pravastatin (PRAVACHOL) 20 MG tablet Take 1 tablet (20 mg total) by mouth daily. 90 tablet 3   No current facility-administered medications on file prior to visit.    LMP 07/19/1995    Objective:   Physical Exam  Constitutional: She is oriented to person, place, and time. She appears well-nourished.  Respiratory:  Effort normal.  Dry cough during exam  Neurological: She is alert and oriented to person, place, and time.  Psychiatric: She has a normal mood and affect.           Assessment & Plan:

## 2019-09-25 ENCOUNTER — Ambulatory Visit: Payer: Medicare Other | Admitting: Family Medicine

## 2019-10-08 ENCOUNTER — Other Ambulatory Visit: Payer: Medicare Other

## 2019-10-08 ENCOUNTER — Ambulatory Visit: Payer: Medicare Other

## 2019-10-23 ENCOUNTER — Ambulatory Visit (INDEPENDENT_AMBULATORY_CARE_PROVIDER_SITE_OTHER): Payer: Medicare Other | Admitting: Family Medicine

## 2019-10-23 ENCOUNTER — Ambulatory Visit (INDEPENDENT_AMBULATORY_CARE_PROVIDER_SITE_OTHER)
Admission: RE | Admit: 2019-10-23 | Discharge: 2019-10-23 | Disposition: A | Discharge status: Home / Self Care | Payer: Medicare Other | Source: Ambulatory Visit | Attending: Family Medicine | Admitting: Family Medicine

## 2019-10-23 ENCOUNTER — Encounter: Payer: Self-pay | Admitting: Family Medicine

## 2019-10-23 ENCOUNTER — Other Ambulatory Visit: Payer: Self-pay

## 2019-10-23 DIAGNOSIS — S99921A Unspecified injury of right foot, initial encounter: Secondary | ICD-10-CM | POA: Diagnosis not present

## 2019-10-23 DIAGNOSIS — M79671 Pain in right foot: Secondary | ICD-10-CM | POA: Insufficient documentation

## 2019-10-23 NOTE — Patient Instructions (Signed)
No fracture on your xray  Continue ice Tylenol as needed  Elevate when able  Wear the hard shoe when walking   Update if not starting to improve in a week or if worsening     For other foot- make an appt with Dr Lorelei Pont when you want to

## 2019-10-23 NOTE — Assessment & Plan Note (Signed)
After hyperflexion after kicking a ball  Swelling/pain improved from day of incident  Xray clear of fx  Advised ice Acetaminophen prn  Post op shoe to control mid foot movement  Update if not starting to improve in a week or if worsening

## 2019-10-23 NOTE — Progress Notes (Signed)
Subjective:    Patient ID: Diane Delacruz, female    DOB: 07-08-1946, 74 y.o.   MRN: [id number]  This visit occurred during the SARS-CoV-2 public health emergency.  Safety protocols were in place, including screening questions prior to the visit, additional usage of staff PPE, and extensive cleaning of exam room while observing appropriate contact time as indicated for disinfecting solutions.    HPI Pt presents for R foot injury   Wt Readings from Last 3 Encounters:  10/23/19 155 lb 3 oz (70.4 kg)  06/25/19 149 lb 11.2 oz (67.9 kg)  03/20/19 145 lb (65.8 kg)   28.85 kg/m   Late Monday -playing with dog- went to kick ball and instead hit concrete  Hyper flexed ankle (plantar flex)  Did not fall  She was able to put weight on it (barely)  It swelled within and hour  Started putting ice on it  Did not wrap  A little bruising medially  Pain is improved but not gone   Worse to stand on toe / dorsiflex  Pain is under foot/base of toes  (sharp and dull both)  No pain when still (since the first night)  No ankle pain  Taking tylenol  (cannot take nsaids)    Has similar old injury on L  (chronic pain)  Would like to see someone about that  Interested in visit with Dr Roderic Scarce of R foot today CLINICAL DATA:  Hyperflexion injury,  EXAM: RIGHT FOOT COMPLETE - 3+ VIEW  COMPARISON:  None.  FINDINGS: There is no evidence of fracture or dislocation. There is no evidence of arthropathy or other focal bone abnormality. Soft tissues are unremarkable.  IMPRESSION: No fracture or dislocation of the right foot.   Electronically Signed   By: Eddie Candle M.D.   On: 10/23/2019 12:08  Patient Active Problem List   Diagnosis Date Noted  . Right foot pain 10/23/2019  . Dizziness 03/20/2019  . Cough 08/03/2017  . Encounter for screening mammogram for breast cancer 06/21/2017  . Elevated glucose level 06/21/2017  . Estrogen deficiency 04/27/2015  . Routine  general medical examination at a health care facility 04/19/2015  . Acute bronchitis 09/02/2014  . Encounter for Medicare annual wellness exam 11/07/2012  . TMJ (temporomandibular joint syndrome) 02/24/2012  . Insomnia 02/21/2011  . Family history of colon cancer 11/01/2010  . Hyperlipidemia 11/01/2010  . Vitamin D deficiency 10/10/2008  . IRRITABLE BOWEL SYNDROME 10/15/2007  . VARICOSE VEINS, LOWER EXTREMITIES 08/15/2007  . Osteoporosis 05/14/2007  . ALLERGIC RHINITIS, SEASONAL 05/11/2007  . GERD 05/11/2007  . FIBROCYSTIC BREAST DISEASE 05/11/2007  . POSTMENOPAUSAL STATUS 05/11/2007   Past Medical History:  Diagnosis Date  . Allergic rhinitis due to pollen   . Allergy   . Anxiety    past hx  . Asymptomatic varicose veins   . Bladder tumor 11/05  . CAD (coronary artery disease)   . DDD (degenerative disc disease)    in neck, neurosurgeon Dr. Trenton Gammon  . Depression   . Diffuse cystic mastopathy   . Diverticulosis   . Dyspnea 2008   negative echo and MV scan  . Esophageal reflux   . History of gallstones   . Hypoglycemia, unspecified   . Irritable bowel syndrome   . Lumbago   . Palpitations 03/06/09   event monitor  . Pure hypercholesterolemia   . Stress fracture of foot 03/28/99   left  . Symptomatic menopausal or female climacteric states   .  Unspecified adverse effect of other drug, medicinal and biological substance(995.29)   . Unspecified vitamin D deficiency    Past Surgical History:  Procedure Laterality Date  . BLADDER SURGERY     tumor removed  . CHOLECYSTECTOMY     gallstones- ultrasound 10/03  . COLONOSCOPY     Social History   Tobacco Use  . Smoking status: Never Smoker  . Smokeless tobacco: Never Used  Substance Use Topics  . Alcohol use: Yes    Alcohol/week: 0.0 standard drinks    Comment: Rare-wine  . Drug use: No   Family History  Problem Relation Age of Onset  . Hyperlipidemia Mother   . Stroke Mother   . Arthritis Mother   . Macular  degeneration Mother   . Irritable bowel syndrome Mother   . Colon cancer Father 80       died 26  . Heart disease Father        MI's  . COPD Sister        heavy smoker  . Colon polyps Maternal Aunt   . Breast cancer Paternal Aunt   . Osteopenia Sister   . Other Daughter        celiac spruce  . Diabetes Brother   . Diabetes Paternal Uncle   . Irritable bowel syndrome Sister   . Kidney disease Cousin   . Rectal cancer Neg Hx   . Stomach cancer Neg Hx   . Esophageal cancer Neg Hx   . Liver cancer Neg Hx   . Pancreatic cancer Neg Hx   . Prostate cancer Neg Hx    Allergies  Allergen Reactions  . Amoxicillin     Rash and diarrhea   . Codeine     REACTION: unknown reaction  . Flexeril [Cyclobenzaprine]     sedation  . Klonopin [Clonazepam]     Dizziness , fatigue  . Nsaids     REACTION: diarrhea  . Omeprazole     REACTION: did not work for her  . Pseudoephedrine     REACTION: jittery  . Tessalon [Benzonatate] Rash    Rash   Current Outpatient Medications on File Prior to Visit  Medication Sig Dispense Refill  . acetaminophen (TYLENOL) 500 MG tablet Take 1,000 mg by mouth every 6 (six) hours as needed for mild pain.     . Calcium-Magnesium-Vitamin D (CALCIUM 1200+D3 PO) Take 1 tablet by mouth daily.    Marland Kitchen esomeprazole (NEXIUM) 40 MG capsule Take 1 capsule (40 mg total) by mouth daily at 12 noon. 30 capsule 11  . fexofenadine (ALLEGRA) 60 MG tablet Take 180 mg by mouth daily as needed for allergies.     . mometasone (NASONEX) 50 MCG/ACT nasal spray Place 2 sprays into the nose daily as needed (allergies). 17 g 11  . pravastatin (PRAVACHOL) 20 MG tablet Take 1 tablet (20 mg total) by mouth daily. 90 tablet 3   No current facility-administered medications on file prior to visit.    Review of Systems  Constitutional: Negative for activity change, appetite change, fatigue, fever and unexpected weight change.  HENT: Negative for congestion, ear pain, rhinorrhea, sinus  pressure and sore throat.   Eyes: Negative for pain, redness and visual disturbance.  Respiratory: Negative for cough, shortness of breath and wheezing.   Cardiovascular: Negative for chest pain and palpitations.  Gastrointestinal: Negative for abdominal pain, blood in stool, constipation and diarrhea.  Endocrine: Negative for polydipsia and polyuria.  Genitourinary: Negative for dysuria, frequency and urgency.  Musculoskeletal: Positive for arthralgias and gait problem. Negative for back pain and myalgias.       Acute R foot pain   Chronic L foot pain (old injury)- interested in sport med eval  Skin: Negative for pallor and rash.  Allergic/Immunologic: Negative for environmental allergies.  Neurological: Negative for dizziness, syncope and headaches.  Hematological: Negative for adenopathy. Does not bruise/bleed easily.  Psychiatric/Behavioral: Negative for decreased concentration and dysphoric mood. The patient is not nervous/anxious.        Objective:   Physical Exam Constitutional:      General: She is not in acute distress.    Appearance: Normal appearance. She is normal weight. She is not ill-appearing.  Eyes:     Conjunctiva/sclera: Conjunctivae normal.     Pupils: Pupils are equal, round, and reactive to light.  Cardiovascular:     Rate and Rhythm: Normal rate and regular rhythm.  Musculoskeletal:        General: Swelling and tenderness present.     Right ankle: Normal.     Right Achilles Tendon: Normal.     Right foot: Decreased range of motion. Normal capillary refill. Swelling, tenderness and bony tenderness present. No deformity, foot drop, prominent metatarsal heads or crepitus. Normal pulse.     Comments: R foot Tender over proximal first metatarsal  More pain to plantar flex foot and toes  Scant old ecchymosis medially  Very mild swelling of dorsal foot  Favors L foot with gait (has to walk on R heel)    Skin:    General: Skin is warm and dry.     Findings:  No erythema or rash.  Neurological:     Mental Status: She is alert.     Sensory: No sensory deficit.     Motor: No weakness.     Coordination: Coordination normal.  Psychiatric:        Mood and Affect: Mood normal.           Assessment & Plan:   Problem List Items Addressed This Visit      Other   Right foot pain    After hyperflexion after kicking a ball  Swelling/pain improved from day of incident  Xray clear of fx  Advised ice Acetaminophen prn  Post op shoe to control mid foot movement  Update if not starting to improve in a week or if worsening        Relevant Orders   DG Foot Complete Right (Completed)

## 2019-11-02 ENCOUNTER — Ambulatory Visit: Payer: Medicare Other | Attending: Internal Medicine

## 2019-11-02 DIAGNOSIS — Z23 Encounter for immunization: Secondary | ICD-10-CM

## 2019-11-02 NOTE — Progress Notes (Signed)
   Covid-19 Vaccination Clinic  Name:  Diane Delacruz    MRN: [id number] DOB: 1945-11-27  11/02/2019  [name] was observed post Covid-19 immunization for 15 minutes without incident. She was provided with Vaccine Information Sheet and instruction to access the V-Safe system.   [name] was instructed to call 911 with any severe reactions post vaccine: Marland Kitchen Difficulty breathing  . Swelling of face and throat  . A fast heartbeat  . A bad rash all over body  . Dizziness and weakness   Immunizations Administered    Name Date Dose VIS Date Route   Pfizer COVID-19 Vaccine 11/02/2019 12:42 PM 0.3 mL 06/28/2019 Intramuscular   Manufacturer: [place]   Lot: [id number]   [place]: [id number]

## 2019-11-11 DIAGNOSIS — H524 Presbyopia: Secondary | ICD-10-CM | POA: Diagnosis not present

## 2019-11-12 ENCOUNTER — Encounter: Payer: Self-pay | Admitting: Family Medicine

## 2019-11-26 ENCOUNTER — Ambulatory Visit: Payer: Medicare Other

## 2019-12-04 ENCOUNTER — Other Ambulatory Visit: Payer: Self-pay

## 2019-12-04 ENCOUNTER — Ambulatory Visit (INDEPENDENT_AMBULATORY_CARE_PROVIDER_SITE_OTHER): Payer: Medicare Other | Admitting: Family Medicine

## 2019-12-04 ENCOUNTER — Encounter: Payer: Self-pay | Admitting: Family Medicine

## 2019-12-04 VITALS — BP 112/64 | HR 75 | Temp 98.6°F | Ht 61.5 in | Wt 153.5 lb

## 2019-12-04 DIAGNOSIS — M79672 Pain in left foot: Secondary | ICD-10-CM

## 2019-12-04 DIAGNOSIS — R208 Other disturbances of skin sensation: Secondary | ICD-10-CM | POA: Diagnosis not present

## 2019-12-04 NOTE — Progress Notes (Signed)
Rafan Sanders T. Diane Hetzer, MD, [place] at [facility] [place], [address]  Phone: [phone]  FAX: [phone]  Diane PITTSER - 74 y.o. female  MRN [id number]  Date of Birth: 06/30/1946  Date: 12/04/2019  PCP: Abner Greenspan, MD  Referral: Abner Greenspan, MD  Chief Complaint  Patient presents with  . Foot Pain    Left    This visit occurred during the SARS-CoV-2 public health emergency.  Safety protocols were in place, including screening questions prior to the visit, additional usage of staff PPE, and extensive cleaning of exam room while observing appropriate contact time as indicated for disinfecting solutions.   Subjective:   Diane Delacruz is a 74 y.o. very pleasant female patient with Body mass index is 28.53 kg/m. who presents with the following:  About 10/17/2019, forced flexion (plantar) at the ankle, able to walk immediately. No fall and started to swell immediately.  At that point the right hyperflexion, plantar flexion injury has entirely recovered.  Now she is having additional right plantar flexion injury several days ago.  It is swollen on the top and she has some burning.  She is not historically had any significant burning, numbness, or tingling in the affected foot.  Right foot injury and plantarflexxed her foot.  L sided hyperflexion and then it will flare up and will flare-up burning about 3-4 day.  More acute.  This is been ongoing for about 3 or 4 days.  Stiff shoe on the LEFT Prior grade 3 ATFL injury  Review of Systems is noted in the HPI, as appropriate   Objective:   BP 112/64   Pulse 75   Temp 98.6 F (37 C) (Temporal)   Ht 5' 1.5" (1.562 m)   Wt 153 lb 8 oz (69.6 kg)   LMP 07/19/1995   SpO2 97%   BMI 28.53 kg/m   GEN: No acute distress; alert,appropriate. PULM: Breathing comfortably in no respiratory distress PSYCH: Normally interactive.      Left foot: Modest dorsal swelling.  Nontender along the tibia and fibula.  Nontender at the malleoli.  Nontender in the midfoot and modest tenderness at the forefoot.  Nontender at the phalanges and all MTP joints.  Calcaneus is nontender, navicular, base of the fifth metatarsal.  There is no endpoint on the patient's anterior drawer testing.  Radiology: No results found.  Assessment and Plan:     ICD-10-CM   1. Acute foot pain, left  M79.672   2. Burning sensation of foot  R20.8    Modest injury of the left midfoot with some swelling.  Likely this will resolve on its own, and tingling is secondary to some swelling.  Her shoes are quite flexible, and I recommended that she get a pair of athletic shoes that has more of a stiff out sole.  I think that this also will need to do and should be completely better in short order.  Follow-up: No follow-ups on file.  No orders of the defined types were placed in this encounter.  There are no discontinued medications. No orders of the defined types were placed in this encounter.   Signed,  Maud Deed. Kennen Stammer, MD   Outpatient Encounter Medications as of 12/04/2019  Medication Sig  . acetaminophen (TYLENOL) 500 MG tablet Take 1,000 mg by mouth every 6 (six) hours as needed for mild pain.   . Calcium-Magnesium-Vitamin D (CALCIUM  1200+D3 PO) Take 1 tablet by mouth daily.  Marland Kitchen esomeprazole (NEXIUM) 40 MG capsule Take 1 capsule (40 mg total) by mouth daily at 12 noon.  . fexofenadine (ALLEGRA) 60 MG tablet Take 180 mg by mouth daily as needed for allergies.   . mometasone (NASONEX) 50 MCG/ACT nasal spray Place 2 sprays into the nose daily as needed (allergies).  . pravastatin (PRAVACHOL) 20 MG tablet Take 1 tablet (20 mg total) by mouth daily.   No facility-administered encounter medications on file as of 12/04/2019.

## 2019-12-13 ENCOUNTER — Other Ambulatory Visit: Payer: Self-pay

## 2019-12-13 ENCOUNTER — Ambulatory Visit: Payer: Self-pay

## 2019-12-13 ENCOUNTER — Encounter: Payer: Self-pay | Admitting: Orthopedic Surgery

## 2019-12-13 ENCOUNTER — Ambulatory Visit: Payer: Medicare Other | Admitting: Orthopedic Surgery

## 2019-12-13 VITALS — Ht 62.0 in | Wt 150.0 lb

## 2019-12-13 DIAGNOSIS — M79672 Pain in left foot: Secondary | ICD-10-CM | POA: Diagnosis not present

## 2019-12-13 DIAGNOSIS — M19072 Primary osteoarthritis, left ankle and foot: Secondary | ICD-10-CM

## 2019-12-14 ENCOUNTER — Encounter: Payer: Self-pay | Admitting: Orthopedic Surgery

## 2019-12-14 NOTE — Progress Notes (Signed)
Office Visit Note   Patient: Diane Delacruz           Date of Birth: February 05, 1946           MRN: [id number] Visit Date: 12/13/2019 Requested by: Tower, Wynelle Fanny, MD [place],  [place] [id number] PCP: Abner Greenspan, MD  Subjective: Chief Complaint  Patient presents with  . Left Foot - Pain    HPI: Diane Delacruz is a 74 y.o. female who presents to the office complaining of left foot pain.  Patient notes gradual onset of worsening left foot pain in the last 3 weeks.  She denies any recent injury but she does note that she accidentally kicked a concrete block a little over a year ago with her left foot 2 times.  She had pain in her toes that improved after several weeks.  She has had no pain until these last 3 weeks.  She notes swelling along the dorsal aspect of the left foot.  She has been taking Tylenol with some relief.  She cannot take NSAIDs due to history of stomach ulcers.  She localizes the majority of her pain to the medial dorsal aspect of the midfoot.  She does note in the last day or 2 some increased pain along the lateral left foot, behind the lateral malleolus.  She notes she has been walking on the lateral edge of her feet.  Her pain is made worse with tight shoes so she tries to avoid wearing shoes whenever possible, opting for crocs.  She has not tried any orthotics or topicals.  Denies any back pain or radicular symptoms..                ROS:  All systems reviewed are negative as they relate to the chief complaint within the history of present illness.  Patient denies fevers or chills.  Assessment & Plan: Visit Diagnoses:  1. Arthritis of left foot   2. Pain in left foot     Plan: Patient is a 74 year old female who presents complaining of 3 weeks of left foot pain.  She has no recent injury.  Pain is worse with tight shoes.  She is able to weight-bear but pain is worse with ambulation.  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  She has most tender over the midfoot diffusely with swelling that is present and not present on her contralateral foot.  She has no such degenerative changes from right foot radiographs taken late last year.  She has been taking Tylenol with little relief.  Plan to try over-the-counter Voltaren gel.  Also wrote prescription for patient to try molded arch support orthotics with a cushioned heel..  Plan for patient to follow-up in 6 weeks for clinical recheck.  She may follow-up sooner if she has no relief of pain.  Differential injections may be considered at that time.  Patient understands and all questions answered..  Follow-Up Instructions: No follow-ups on file.   Orders:  Orders Placed This Encounter  Procedures  . XR Foot Complete Left   No orders of the defined types were placed in this encounter.     Procedures: No procedures performed   Clinical Data: No additional findings.  Objective: Vital Signs: Ht 5\' 2"  (1.575 m)   Wt 150 lb (68 kg)   LMP 07/19/1995   BMI 27.44 kg/m   Physical Exam:  Constitutional: Patient appears well-developed HEENT:  Head: Normocephalic Eyes:EOM  are normal Neck: Normal range of motion Cardiovascular: Normal rate Pulmonary/chest: Effort normal Neurologic: Patient is alert Skin: Skin is warm Psychiatric: Patient has normal mood and affect  Ortho Exam:  Left foot exam demonstrates swelling over the dorsal midfoot with maximal point of tenderness over this area.  Patient also has tenderness mildly at the fifth metatarsal base with tenderness that extends behind the lateral malleolus.  No tenderness palpation over the lateral/medial malleolus, calcaneus, retrocalcaneal space, anterior tib tendon, Achilles tendon, metatarsal heads, toes, plantar fascia, navicular medially.  Patient is able to actively dorsiflex, plantarflex, evert, invert.  Mild pain elicited laterally with resisted eversion.  Pain is worse with getting on her  tiptoes.  She has a moderately high arch.  Specialty Comments:  No specialty comments available.  Imaging: No results found.   PMFS History: Patient Active Problem List   Diagnosis Date Noted  . Right foot pain 10/23/2019  . Dizziness 03/20/2019  . Cough 08/03/2017  . Encounter for screening mammogram for breast cancer 06/21/2017  . Elevated glucose level 06/21/2017  . Estrogen deficiency 04/27/2015  . Routine general medical examination at a health care facility 04/19/2015  . Acute bronchitis 09/02/2014  . Encounter for Medicare annual wellness exam 11/07/2012  . TMJ (temporomandibular joint syndrome) 02/24/2012  . Insomnia 02/21/2011  . Family history of colon cancer 11/01/2010  . Hyperlipidemia 11/01/2010  . Vitamin D deficiency 10/10/2008  . IRRITABLE BOWEL SYNDROME 10/15/2007  . VARICOSE VEINS, LOWER EXTREMITIES 08/15/2007  . Osteoporosis 05/14/2007  . ALLERGIC RHINITIS, SEASONAL 05/11/2007  . GERD 05/11/2007  . FIBROCYSTIC BREAST DISEASE 05/11/2007  . POSTMENOPAUSAL STATUS 05/11/2007   Past Medical History:  Diagnosis Date  . Allergic rhinitis due to pollen   . Allergy   . Anxiety    past hx  . Asymptomatic varicose veins   . Bladder tumor 11/05  . CAD (coronary artery disease)   . DDD (degenerative disc disease)    in neck, neurosurgeon Dr. Trenton Gammon  . Depression   . Diffuse cystic mastopathy   . Diverticulosis   . Dyspnea 2008   negative echo and MV scan  . Esophageal reflux   . History of gallstones   . Hypoglycemia, unspecified   . Irritable bowel syndrome   . Lumbago   . Palpitations 03/06/09   event monitor  . Pure hypercholesterolemia   . Stress fracture of foot 03/28/99   left  . Symptomatic menopausal or female climacteric states   . Unspecified adverse effect of other drug, medicinal and biological substance(995.29)   . Unspecified vitamin D deficiency     Family History  Problem Relation Age of Onset  . Hyperlipidemia Mother   . Stroke  Mother   . Arthritis Mother   . Macular degeneration Mother   . Irritable bowel syndrome Mother   . Colon cancer Father 41       died 52  . Heart disease Father        MI's  . COPD Sister        heavy smoker  . Colon polyps Maternal Aunt   . Breast cancer Paternal Aunt   . Osteopenia Sister   . Other Daughter        celiac spruce  . Diabetes Brother   . Diabetes Paternal Uncle   . Irritable bowel syndrome Sister   . Kidney disease Cousin   . Rectal cancer Neg Hx   . Stomach cancer Neg Hx   . Esophageal cancer Neg Hx   .  Liver cancer Neg Hx   . Pancreatic cancer Neg Hx   . Prostate cancer Neg Hx     Past Surgical History:  Procedure Laterality Date  . BLADDER SURGERY     tumor removed  . CHOLECYSTECTOMY     gallstones- ultrasound 10/03  . COLONOSCOPY     Social History   Occupational History  . Occupation: retired    Fish farm manager: unemployed  Tobacco Use  . Smoking status: Never Smoker  . Smokeless tobacco: Never Used  Substance and Sexual Activity  . Alcohol use: Yes    Alcohol/week: 0.0 standard drinks    Comment: Rare-wine  . Drug use: No  . Sexual activity: Never

## 2019-12-15 ENCOUNTER — Encounter: Payer: Self-pay | Admitting: Orthopedic Surgery

## 2019-12-20 ENCOUNTER — Encounter: Payer: Self-pay | Admitting: Family Medicine

## 2019-12-26 ENCOUNTER — Ambulatory Visit
Admission: RE | Admit: 2019-12-26 | Discharge: 2019-12-26 | Disposition: A | Discharge status: Home / Self Care | Payer: Medicare Other | Source: Ambulatory Visit | Attending: Family Medicine | Admitting: Family Medicine

## 2019-12-26 ENCOUNTER — Other Ambulatory Visit: Payer: Self-pay

## 2019-12-26 DIAGNOSIS — M810 Age-related osteoporosis without current pathological fracture: Secondary | ICD-10-CM | POA: Diagnosis not present

## 2019-12-26 DIAGNOSIS — Z780 Asymptomatic menopausal state: Secondary | ICD-10-CM | POA: Diagnosis not present

## 2019-12-26 DIAGNOSIS — Z1231 Encounter for screening mammogram for malignant neoplasm of breast: Secondary | ICD-10-CM

## 2019-12-26 DIAGNOSIS — E2839 Other primary ovarian failure: Secondary | ICD-10-CM

## 2020-01-08 ENCOUNTER — Telehealth: Payer: Self-pay | Admitting: Orthopedic Surgery

## 2020-01-08 NOTE — Telephone Encounter (Signed)
FYI

## 2020-01-08 NOTE — Telephone Encounter (Signed)
Charlie with [employer] called to make Dr. Marlou Sa aware the pt was denied for the foot insert and she would be faxing a paper stating this.

## 2020-01-08 NOTE — Telephone Encounter (Signed)
ok 

## 2020-04-21 ENCOUNTER — Other Ambulatory Visit: Payer: Self-pay

## 2020-04-21 ENCOUNTER — Encounter: Payer: Self-pay | Admitting: Internal Medicine

## 2020-04-21 ENCOUNTER — Ambulatory Visit (INDEPENDENT_AMBULATORY_CARE_PROVIDER_SITE_OTHER): Payer: Medicare Other | Admitting: Internal Medicine

## 2020-04-21 VITALS — BP 114/72 | HR 62 | Temp 97.9°F | Wt 151.0 lb

## 2020-04-21 DIAGNOSIS — Z23 Encounter for immunization: Secondary | ICD-10-CM

## 2020-04-21 DIAGNOSIS — R310 Gross hematuria: Secondary | ICD-10-CM

## 2020-04-21 DIAGNOSIS — R109 Unspecified abdominal pain: Secondary | ICD-10-CM | POA: Diagnosis not present

## 2020-04-21 LAB — POC URINALSYSI DIPSTICK (AUTOMATED)
Bilirubin, UA: NEGATIVE
Glucose, UA: NEGATIVE
Ketones, UA: NEGATIVE
Nitrite, UA: NEGATIVE
Protein, UA: NEGATIVE
Spec Grav, UA: 1.01 (ref 1.010–1.025)
Urobilinogen, UA: 0.2 E.U./dL
pH, UA: 6 (ref 5.0–8.0)

## 2020-04-21 MED ORDER — NITROFURANTOIN MONOHYD MACRO 100 MG PO CAPS
100.0000 mg | ORAL_CAPSULE | Freq: Two times a day (BID) | ORAL | 0 refills | Status: DC
Start: 2020-04-21 — End: 2020-07-01

## 2020-04-21 NOTE — Progress Notes (Signed)
HPI  Pt presents to the clinic today with c/o low back pain and blood in her urine. She reports this started 3 day ago. She denies urgency, frequency or dysuria. She describes the pain as sore and achy. She denies vaginal discharge, irritation or abnormal bleeding. She denies fever, chills or body aches. She has no history of kidney stones. She has not taken anything OTC for her symptoms.    Review of Systems  Past Medical History:  Diagnosis Date  . Allergic rhinitis due to pollen   . Allergy   . Anxiety    past hx  . Asymptomatic varicose veins   . Bladder tumor 11/05  . CAD (coronary artery disease)   . DDD (degenerative disc disease)    in neck, neurosurgeon Dr. Trenton Gammon  . Depression   . Diffuse cystic mastopathy   . Diverticulosis   . Dyspnea 2008   negative echo and MV scan  . Esophageal reflux   . History of gallstones   . Hypoglycemia, unspecified   . Irritable bowel syndrome   . Lumbago   . Palpitations 03/06/09   event monitor  . Pure hypercholesterolemia   . Stress fracture of foot 03/28/99   left  . Symptomatic menopausal or female climacteric states   . Unspecified adverse effect of other drug, medicinal and biological substance(995.29)   . Unspecified vitamin D deficiency     Family History  Problem Relation Age of Onset  . Hyperlipidemia Mother   . Stroke Mother   . Arthritis Mother   . Macular degeneration Mother   . Irritable bowel syndrome Mother   . Colon cancer Father 59       died 61  . Heart disease Father        MI's  . COPD Sister        heavy smoker  . Colon polyps Maternal Aunt   . Breast cancer Paternal Aunt   . Osteopenia Sister   . Other Daughter        celiac spruce  . Diabetes Brother   . Diabetes Paternal Uncle   . Irritable bowel syndrome Sister   . Kidney disease Cousin   . Rectal cancer Neg Hx   . Stomach cancer Neg Hx   . Esophageal cancer Neg Hx   . Liver cancer Neg Hx   . Pancreatic cancer Neg Hx   . Prostate cancer  Neg Hx     Social History   Socioeconomic History  . Marital status: Married    Spouse name: Not on file  . Number of children: 1  . Years of education: Not on file  . Highest education level: Not on file  Occupational History  . Occupation: retired    Fish farm manager: unemployed  Tobacco Use  . Smoking status: Never Smoker  . Smokeless tobacco: Never Used  Vaping Use  . Vaping Use: Never used  Substance and Sexual Activity  . Alcohol use: Yes    Alcohol/week: 0.0 standard drinks    Comment: Rare-wine  . Drug use: No  . Sexual activity: Never  Other Topics Concern  . 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 Difficulty of Paying Living Expenses: Not hard at all  Food Insecurity: No Food Insecurity  . Worried About Charity fundraiser in the Last Year:  Never true  . Ran Out of Food in the Last Year: Never true  Transportation Needs: No Transportation Needs  . Lack of Transportation (Medical): No  . Lack of Transportation (Non-Medical): No  Physical Activity: Inactive  . Days of Exercise per Week: 0 days  . Minutes of Exercise per Session: 0 min  Stress: No Stress Concern Present  . Feeling of Stress : Not at all  Social Connections:   . Frequency of Communication with Friends and Family: Not on file  . Frequency of Social Gatherings with Friends and Family: Not on file  . Attends Religious Services: Not on file  . Active Member of Clubs or Organizations: Not on file  . Attends Archivist Meetings: Not on file  . Marital Status: Not on file  Intimate Partner Violence: Not At Risk  . Fear of Current or Ex-Partner: No  . Emotionally Abused: No  . Physically Abused: No  . Sexually Abused: No    Allergies  Allergen Reactions  . Amoxicillin     Rash and diarrhea   . Codeine     REACTION: unknown reaction  .  Flexeril [Cyclobenzaprine]     sedation  . Klonopin [Clonazepam]     Dizziness , fatigue  . Nsaids     REACTION: diarrhea  . Omeprazole     REACTION: did not work for her  . Pseudoephedrine     REACTION: jittery  . Tessalon [Benzonatate] Rash    Rash     Constitutional: Denies fever, malaise, fatigue, headache or abrupt weight changes.   GU: Pt reports bilateral flank pain, blood in urine. Denies urgency, frequency, burning sensation, odor or discharge. Skin: Denies redness, rashes, lesions or ulcercations.   No other specific complaints in a complete review of systems (except as listed in HPI above).    Objective:   Physical Exam  BP 114/72   Pulse 62   Temp 97.9 F (36.6 C) (Temporal)   Wt 151 lb (68.5 kg)   LMP 07/19/1995   SpO2 98%   BMI 27.62 kg/m   Wt Readings from Last 3 Encounters:  12/13/19 150 lb (68 kg)  12/04/19 153 lb 8 oz (69.6 kg)  10/23/19 155 lb 3 oz (70.4 kg)    General: Appears her stated age, well developed, well nourished in NAD. Cardiovascular: Normal rate.  Pulmonary/Chest: Normal effort.  Abdomen: Soft and nontender. Normal bowel sounds. No distention or masses noted. No CVA tenderness.        Assessment & Plan:   Bilateral Flank Pain, Blood in Urine:  Urinalysis: 1+ leuks, trace blood Will send urine culture eRx sent if for Macrobid 100 mg BID x 5 days (to start if symptoms reoccur in the next 2 days) Drink plenty of fluids  RTC as needed or if symptoms persist. Webb Silversmith, NP This visit occurred during the SARS-CoV-2 public health emergency.  Safety protocols were in place, including screening questions prior to the visit, additional usage of staff PPE, and extensive cleaning of exam room while observing appropriate contact time as indicated for disinfecting solutions.

## 2020-04-21 NOTE — Patient Instructions (Signed)
Hematuria, Adult Hematuria is blood in the urine. Blood may be visible in the urine, or it may be identified with a test. This condition can be caused by infections of the bladder, urethra, kidney, or prostate. Other possible causes include:  Kidney stones.  Cancer of the urinary tract.  Too much calcium in the urine.  Conditions that are passed from parent to child (inherited conditions).  Exercise that requires a lot of energy. Infections can usually be treated with medicine, and a kidney stone usually will pass through your urine. If neither of these is the cause of your hematuria, more tests may be needed to identify the cause of your symptoms. It is very important to tell your health care provider about any blood in your urine, even if it is painless or the blood stops without treatment. Blood in the urine, when it happens and then stops and then happens again, can be a symptom of a very serious condition, including cancer. There is no pain in the initial stages of many urinary cancers. Follow these instructions at home: Medicines  Take over-the-counter and prescription medicines only as told by your health care provider.  If you were prescribed an antibiotic medicine, take it as told by your health care provider. Do not stop taking the antibiotic even if you start to feel better. Eating and drinking  Drink enough fluid to keep your urine clear or pale yellow. It is recommended that you drink 3-4 quarts (2.8-3.8 L) a day. If you have been diagnosed with an infection, it is recommended that you drink cranberry juice in addition to large amounts of water.  Avoid caffeine, tea, and carbonated beverages. These tend to irritate the bladder.  Avoid alcohol because it may irritate the prostate (men). General instructions  If you have been diagnosed with a kidney stone, follow your health care provider's instructions about straining your urine to catch the stone.  Empty your bladder  often. Avoid holding urine for long periods of time.  If you are female: ? After a bowel movement, wipe from front to back and use each piece of toilet paper only once. ? Empty your bladder before and after sex.  Pay attention to any changes in your symptoms. Tell your health care provider about any changes or any new symptoms.  It is your responsibility to get your test results. Ask your health care provider, or the department performing the test, when your results will be ready.  Keep all follow-up visits as told by your health care provider. This is important. Contact a health care provider if:  You develop back pain.  You have a fever.  You have nausea or vomiting.  Your symptoms do not improve after 3 days.  Your symptoms get worse. Get help right away if:  You develop severe vomiting and are unable take medicine without vomiting.  You develop severe pain in your back or abdomen even though you are taking medicine.  You pass a large amount of blood in your urine.  You pass blood clots in your urine.  You feel very weak or like you might faint.  You faint. Summary  Hematuria is blood in the urine. It has many possible causes.  It is very important that you tell your health care provider about any blood in your urine, even if it is painless or the blood stops without treatment.  Take over-the-counter and prescription medicines only as told by your health care provider.  Drink enough fluid to keep   your urine clear or pale yellow. This information is not intended to replace advice given to you by your health care provider. Make sure you discuss any questions you have with your health care provider. Document Revised: 11/28/2018 Document Reviewed: 08/06/2016 Elsevier Patient Education  2020 [employer]  

## 2020-04-21 NOTE — Addendum Note (Signed)
Addended by: Lindalou Hose Y on: 04/21/2020 03:00 PM   Modules accepted: Orders

## 2020-04-29 ENCOUNTER — Telehealth: Payer: Self-pay | Admitting: *Deleted

## 2020-04-29 NOTE — Telephone Encounter (Signed)
I do not see where urine culture was sent. Can we check on this. How are her symptoms after abx?

## 2020-04-29 NOTE — Telephone Encounter (Signed)
Patient left a voicemail stating that she saw Webb Silversmith NP last week for a UTI. Patient stated that she had some blood in her urine. Patient stated that she has not gotten her urine test results yet and would like a call back with them.

## 2020-04-30 NOTE — Telephone Encounter (Signed)
Let her know culture did not get sent. If she is still having issues, may want to follow up to provide another urine sample.

## 2020-05-01 NOTE — Telephone Encounter (Signed)
Go ahead and start abx, update me next week on symptoms.

## 2020-05-01 NOTE — Telephone Encounter (Signed)
Pt reports she has not noticed blood in her urine, still having some back pain and intermittent nausea... pt said she did not take Ax because you advised her to wait for urine culture results before starting... please advise

## 2020-06-23 ENCOUNTER — Telehealth: Payer: Self-pay | Admitting: Family Medicine

## 2020-06-23 DIAGNOSIS — Z0000 Encounter for general adult medical examination without abnormal findings: Secondary | ICD-10-CM

## 2020-06-23 DIAGNOSIS — R7309 Other abnormal glucose: Secondary | ICD-10-CM

## 2020-06-23 DIAGNOSIS — E7800 Pure hypercholesterolemia, unspecified: Secondary | ICD-10-CM

## 2020-06-23 DIAGNOSIS — E559 Vitamin D deficiency, unspecified: Secondary | ICD-10-CM

## 2020-06-23 DIAGNOSIS — M810 Age-related osteoporosis without current pathological fracture: Secondary | ICD-10-CM

## 2020-06-23 NOTE — Telephone Encounter (Signed)
-----   Message from Cloyd Stagers, RT sent at 06/15/2020 10:29 AM EST ----- Regarding: Lab Orders for Wednesday 12.8.2021 Please place lab orders for Wednesday 12.8.2021, office visit for physical on Wednesday 12.15.2021 Thank you, Dyke Maes RT(R)

## 2020-06-24 ENCOUNTER — Other Ambulatory Visit: Payer: Self-pay

## 2020-06-24 ENCOUNTER — Other Ambulatory Visit (INDEPENDENT_AMBULATORY_CARE_PROVIDER_SITE_OTHER): Payer: Medicare Other

## 2020-06-24 DIAGNOSIS — E7800 Pure hypercholesterolemia, unspecified: Secondary | ICD-10-CM | POA: Diagnosis not present

## 2020-06-24 DIAGNOSIS — E559 Vitamin D deficiency, unspecified: Secondary | ICD-10-CM

## 2020-06-24 DIAGNOSIS — R7309 Other abnormal glucose: Secondary | ICD-10-CM | POA: Diagnosis not present

## 2020-06-24 DIAGNOSIS — Z0000 Encounter for general adult medical examination without abnormal findings: Secondary | ICD-10-CM | POA: Diagnosis not present

## 2020-06-24 LAB — COMPREHENSIVE METABOLIC PANEL
ALT: 14 U/L (ref 0–35)
AST: 21 U/L (ref 0–37)
Albumin: 4.1 g/dL (ref 3.5–5.2)
Alkaline Phosphatase: 92 U/L (ref 39–117)
BUN: 10 mg/dL (ref 6–23)
CO2: 30 mEq/L (ref 19–32)
Calcium: 9.6 mg/dL (ref 8.4–10.5)
Chloride: 103 mEq/L (ref 96–112)
Creatinine, Ser: 0.96 mg/dL (ref 0.40–1.20)
GFR: 58.49 mL/min — ABNORMAL LOW (ref >60.00)
Glucose, Bld: 99 mg/dL (ref 70–99)
Potassium: 4.5 mEq/L (ref 3.5–5.1)
Sodium: 138 mEq/L (ref 135–145)
Total Bilirubin: 0.6 mg/dL (ref 0.2–1.2)
Total Protein: 6.4 g/dL (ref 6.0–8.3)

## 2020-06-24 LAB — LIPID PANEL
Cholesterol: 190 mg/dL (ref 0–200)
HDL: 70.8 mg/dL (ref >39.00)
LDL Cholesterol: 102 mg/dL — ABNORMAL HIGH (ref 0–99)
NonHDL: 119.13
Total CHOL/HDL Ratio: 3
Triglycerides: 86 mg/dL (ref 0.0–149.0)
VLDL: 17.2 mg/dL (ref 0.0–40.0)

## 2020-06-24 LAB — CBC WITH DIFFERENTIAL/PLATELET
Basophils Absolute: 0.1 10*3/uL (ref 0.0–0.1)
Basophils Relative: 1.1 % (ref 0.0–3.0)
Eosinophils Absolute: 0.4 10*3/uL (ref 0.0–0.7)
Eosinophils Relative: 7.1 % — ABNORMAL HIGH (ref 0.0–5.0)
HCT: 38.5 % (ref 36.0–46.0)
Hemoglobin: 13 g/dL (ref 12.0–15.0)
Lymphocytes Relative: 45.8 % (ref 12.0–46.0)
Lymphs Abs: 2.7 10*3/uL (ref 0.7–4.0)
MCHC: 33.7 g/dL (ref 30.0–36.0)
MCV: 92.3 fl (ref 78.0–100.0)
Monocytes Absolute: 0.4 10*3/uL (ref 0.1–1.0)
Monocytes Relative: 7.2 % (ref 3.0–12.0)
Neutro Abs: 2.3 10*3/uL (ref 1.4–7.7)
Neutrophils Relative %: 38.8 % — ABNORMAL LOW (ref 43.0–77.0)
Platelets: 293 10*3/uL (ref 150.0–400.0)
RBC: 4.17 Mil/uL (ref 3.87–5.11)
RDW: 13.2 % (ref 11.5–15.5)
WBC: 5.9 10*3/uL (ref 4.0–10.5)

## 2020-06-24 LAB — TSH: TSH: 1.61 u[IU]/mL (ref 0.35–4.50)

## 2020-06-24 LAB — HEMOGLOBIN A1C: Hgb A1c MFr Bld: 5.6 % (ref 4.6–6.5)

## 2020-06-24 LAB — VITAMIN D 25 HYDROXY (VIT D DEFICIENCY, FRACTURES): VITD: 32.69 ng/mL (ref 30.00–100.00)

## 2020-07-01 ENCOUNTER — Other Ambulatory Visit: Payer: Self-pay

## 2020-07-01 ENCOUNTER — Encounter: Payer: Self-pay | Admitting: Family Medicine

## 2020-07-01 ENCOUNTER — Ambulatory Visit (INDEPENDENT_AMBULATORY_CARE_PROVIDER_SITE_OTHER): Payer: Medicare Other | Admitting: Family Medicine

## 2020-07-01 VITALS — BP 110/62 | HR 69 | Temp 96.9°F | Ht 61.5 in | Wt 148.1 lb

## 2020-07-01 DIAGNOSIS — Z0000 Encounter for general adult medical examination without abnormal findings: Secondary | ICD-10-CM

## 2020-07-01 DIAGNOSIS — E7800 Pure hypercholesterolemia, unspecified: Secondary | ICD-10-CM

## 2020-07-01 DIAGNOSIS — M810 Age-related osteoporosis without current pathological fracture: Secondary | ICD-10-CM

## 2020-07-01 DIAGNOSIS — E559 Vitamin D deficiency, unspecified: Secondary | ICD-10-CM | POA: Diagnosis not present

## 2020-07-01 DIAGNOSIS — K219 Gastro-esophageal reflux disease without esophagitis: Secondary | ICD-10-CM | POA: Diagnosis not present

## 2020-07-01 DIAGNOSIS — R7309 Other abnormal glucose: Secondary | ICD-10-CM

## 2020-07-01 MED ORDER — ESOMEPRAZOLE MAGNESIUM 40 MG PO CPDR: 40.0000 mg | DELAYED_RELEASE_CAPSULE | Freq: Every day | ORAL | 3 refills | Status: DC

## 2020-07-01 MED ORDER — ALENDRONATE SODIUM 70 MG PO TABS: 70.0000 mg | ORAL_TABLET | ORAL | 11 refills | Status: DC

## 2020-07-01 MED ORDER — PRAVASTATIN SODIUM 20 MG PO TABS: 20.0000 mg | ORAL_TABLET | Freq: Every day | ORAL | 3 refills | Status: DC

## 2020-07-01 NOTE — Assessment & Plan Note (Signed)
Level in 30s In setting of OP  Agreed to add another 1000 iu vit D3 daily

## 2020-07-01 NOTE — Assessment & Plan Note (Signed)
Rev dexa from June No falls or fx Will inc vit D3 intake by 1000 iu more  Trial of alendronate (px and handout given)  Goal is 5 y of tx (dexa in 2)  Rev poss side eff incl GI (low risk of ON of jaw due to no teeth) Encouraged walking for exercise

## 2020-07-01 NOTE — Patient Instructions (Addendum)
If you are interested in the new shingles vaccine (Shingrix) - call your local pharmacy to check on coverage and availability  If affordable, get on a wait list at your pharmacy to get the vaccine.     vitamin D is low normal range  Add an extra 1000 iu of D3 daily  Take a look at info on alendronate and see if you want to try it   Labs are stable   Take care of yourself!

## 2020-07-01 NOTE — Assessment & Plan Note (Signed)
Lab Results  Component Value Date   HGBA1C 5.6 06/24/2020   disc imp of low glycemic diet and wt loss to prevent DM2

## 2020-07-01 NOTE — Progress Notes (Signed)
Subjective:    Patient ID: Diane Delacruz, female    DOB: 1946/01/02, 74 y.o.   MRN: [id number]  This visit occurred during the SARS-CoV-2 public health emergency.  Safety protocols were in place, including screening questions prior to the visit, additional usage of staff PPE, and extensive cleaning of exam room while observing appropriate contact time as indicated for disinfecting solutions.    HPI Pt presents for amw and health mt exam   I have personally reviewed the Medicare Annual Wellness questionnaire and have noted 1. The patient's medical and social history 2. Their use of alcohol, tobacco or illicit drugs 3. Their current medications and supplements 4. The patient's functional ability including ADL's, fall risks, home safety risks and hearing or visual             impairment. 5. Diet and physical activities 6. Evidence for depression or mood disorders  The patients weight, height, BMI have been recorded in the chart and visual acuity is per eye clinic.  I have made referrals, counseling and provided education to the patient based review of the above and I have provided the pt with a written personalized care plan for preventive services. Reviewed and updated provider list, see scanned forms.  See scanned forms.  Routine anticipatory guidance given to patient.  See health maintenance. Colon cancer screening colonoscopy 3/19 with 5 y recall Breast cancer screening  Mammogram 6/21 Self breast exam-no lumps  Flu vaccine 10/21 Tetanus vaccine 7/20 Pneumovax completed Had one covid vaccine pfizer in April (not bad side effects) -unsure if she will get another  Zoster vaccine-interested in shingrix (has had shingles in the past)  Dexa 6/21  Osteoporosis -has not been on medicine in the past  Falls-none Fractures-none  Supplements  Ca and D D level is 32 Exercise - walking , lots of yard work in the summer  Family hx of OP    Has dentures -no dental issues   Advance  directive-up to date  Cognitive function addressed- see scanned forms- and if abnormal then additional documentation follows.   No concerns  A bit more forgetful-nothing significant  Not getting confused or lost   PMH and SH reviewed  Meds, vitals, and allergies reviewed.   ROS: See HPI.  Otherwise negative.    Weight : Wt Readings from Last 3 Encounters:  07/01/20 148 lb 1 oz (67.2 kg)  04/21/20 151 lb (68.5 kg)  12/13/19 150 lb (68 kg)   27.52 kg/m  Good exercise   Hearing/vision:  Hearing Screening   125Hz  250Hz  500Hz  1000Hz  2000Hz  3000Hz  4000Hz  6000Hz  8000Hz   Right ear:   40 40 40  40    Left ear:   40 40 40  0    Vision Screening Comments: Eye exam in March at [facility]   BP Readings from Last 3 Encounters:  07/01/20 110/62  04/21/20 114/72  12/04/19 112/64   Pulse Readings from Last 3 Encounters:  07/01/20 69  04/21/20 62  12/04/19 75   Takes nexium 40 mg for GERD Still working  Has IBS issues (stress may play a role)   Hyperlipidemia Lab Results  Component Value Date   CHOL 190 06/24/2020   CHOL 185 06/20/2019   CHOL 173 06/18/2018   Lab Results  Component Value Date   HDL 70.80 06/24/2020   HDL 74.10 06/20/2019   HDL 70.00 06/18/2018   Lab Results  Component Value Date  LDLCALC 102 (H) 06/24/2020   LDLCALC 95 06/20/2019   LDLCALC 90 06/18/2018   Lab Results  Component Value Date   TRIG 86.0 06/24/2020   TRIG 79.0 06/20/2019   TRIG 62.0 06/18/2018   Lab Results  Component Value Date   CHOLHDL 3 06/24/2020   CHOLHDL 2 06/20/2019   CHOLHDL 2 06/18/2018   Lab Results  Component Value Date   LDLDIRECT 170.5 04/30/2012   LDLDIRECT 144.6 10/31/2011   LDLDIRECT 137.0 12/01/2010   Pravastatin 20 mg daily  and diet  Stab;e   Past elevated glucose Lab Results  Component Value Date   HGBA1C 5.6 06/24/2020   Glucose 99 fasting   Other labs Lab Results   Component Value Date   CREATININE 0.96 06/24/2020   BUN 10 06/24/2020   NA 138 06/24/2020   K 4.5 06/24/2020   CL 103 06/24/2020   CO2 30 06/24/2020   Lab Results  Component Value Date   ALT 14 06/24/2020   AST 21 06/24/2020   ALKPHOS 92 06/24/2020   BILITOT 0.6 06/24/2020   Lab Results  Component Value Date   WBC 5.9 06/24/2020   HGB 13.0 06/24/2020   HCT 38.5 06/24/2020   MCV 92.3 06/24/2020   PLT 293.0 06/24/2020   Lab Results  Component Value Date   TSH 1.61 06/24/2020    Patient Active Problem List   Diagnosis Date Noted  . Right foot pain 10/23/2019  . Dizziness 03/20/2019  . Cough 08/03/2017  . Encounter for screening mammogram for breast cancer 06/21/2017  . Elevated glucose level 06/21/2017  . Estrogen deficiency 04/27/2015  . Routine general medical examination at a health care facility 04/19/2015  . Encounter for Medicare annual wellness exam 11/07/2012  . TMJ (temporomandibular joint syndrome) 02/24/2012  . Insomnia 02/21/2011  . Family history of colon cancer 11/01/2010  . Hyperlipidemia 11/01/2010  . Vitamin D deficiency 10/10/2008  . IRRITABLE BOWEL SYNDROME 10/15/2007  . VARICOSE VEINS, LOWER EXTREMITIES 08/15/2007  . Osteoporosis 05/14/2007  . ALLERGIC RHINITIS, SEASONAL 05/11/2007  . GERD 05/11/2007  . FIBROCYSTIC BREAST DISEASE 05/11/2007  . POSTMENOPAUSAL STATUS 05/11/2007   Past Medical History:  Diagnosis Date  . Allergic rhinitis due to pollen   . Allergy   . Anxiety    past hx  . Asymptomatic varicose veins   . Bladder tumor 11/05  . CAD (coronary artery disease)   . DDD (degenerative disc disease)    in neck, neurosurgeon Dr. Trenton Gammon  . Depression   . Diffuse cystic mastopathy   . Diverticulosis   . Dyspnea 2008   negative echo and MV scan  . Esophageal reflux   . History of gallstones   . Hypoglycemia, unspecified   . Irritable bowel syndrome   . Lumbago   . Palpitations 03/06/09   event monitor  . Pure  hypercholesterolemia   . Stress fracture of foot 03/28/99   left  . Symptomatic menopausal or female climacteric states   . Unspecified adverse effect of other drug, medicinal and biological substance(995.29)   . Unspecified vitamin D deficiency    Past Surgical History:  Procedure Laterality Date  . BLADDER SURGERY     tumor removed  . CHOLECYSTECTOMY     gallstones- ultrasound 10/03  . COLONOSCOPY     Social History   Tobacco Use  . Smoking status: Never Smoker  . Smokeless tobacco: Never Used  Vaping Use  . Vaping Use: Never used  Substance Use Topics  . Alcohol use:  Yes    Alcohol/week: 0.0 standard drinks    Comment: Rare-wine  . Drug use: No   Family History  Problem Relation Age of Onset  . Hyperlipidemia Mother   . Stroke Mother   . Arthritis Mother   . Macular degeneration Mother   . Irritable bowel syndrome Mother   . Colon cancer Father 76       died 65  . Heart disease Father        MI's  . COPD Sister        heavy smoker  . Colon polyps Maternal Aunt   . Breast cancer Paternal Aunt   . Osteopenia Sister   . Other Daughter        celiac spruce  . Diabetes Brother   . Diabetes Paternal Uncle   . Irritable bowel syndrome Sister   . Kidney disease Cousin   . Rectal cancer Neg Hx   . Stomach cancer Neg Hx   . Esophageal cancer Neg Hx   . Liver cancer Neg Hx   . Pancreatic cancer Neg Hx   . Prostate cancer Neg Hx    Allergies  Allergen Reactions  . Amoxicillin     Rash and diarrhea   . Codeine     REACTION: unknown reaction  . Flexeril [Cyclobenzaprine]     sedation  . Klonopin [Clonazepam]     Dizziness , fatigue  . Nsaids     REACTION: diarrhea  . Omeprazole     REACTION: did not work for her  . Pseudoephedrine     REACTION: jittery  . Tessalon [Benzonatate] Rash    Rash   Current Outpatient Medications on File Prior to Visit  Medication Sig Dispense Refill  . acetaminophen (TYLENOL) 500 MG tablet Take 1,000 mg by mouth every 6  (six) hours as needed for mild pain.     . Calcium-Magnesium-Vitamin D (CALCIUM 1200+D3 PO) Take 1 tablet by mouth daily.    . fexofenadine (ALLEGRA) 60 MG tablet Take 180 mg by mouth daily as needed for allergies.    . mometasone (NASONEX) 50 MCG/ACT nasal spray Place 2 sprays into the nose daily as needed (allergies). 17 g 11   No current facility-administered medications on file prior to visit.    Review of Systems  Constitutional: Negative for activity change, appetite change, fatigue, fever and unexpected weight change.  HENT: Negative for congestion, ear pain, rhinorrhea, sinus pressure and sore throat.   Eyes: Negative for pain, redness and visual disturbance.  Respiratory: Negative for cough, shortness of breath and wheezing.   Cardiovascular: Negative for chest pain and palpitations.  Gastrointestinal: Negative for abdominal pain, blood in stool, constipation and diarrhea.  Endocrine: Negative for polydipsia and polyuria.  Genitourinary: Negative for dysuria, frequency and urgency.  Musculoskeletal: Negative for arthralgias, back pain and myalgias.  Skin: Negative for pallor and rash.  Allergic/Immunologic: Negative for environmental allergies.  Neurological: Negative for dizziness, syncope and headaches.  Hematological: Negative for adenopathy. Does not bruise/bleed easily.  Psychiatric/Behavioral: Negative for decreased concentration and dysphoric mood. The patient is not nervous/anxious.        Objective:   Physical Exam Constitutional:      General: She is not in acute distress.    Appearance: Normal appearance. She is well-developed and normal weight. She is not ill-appearing or diaphoretic.  HENT:     Head: Normocephalic and atraumatic.     Right Ear: Tympanic membrane, ear canal and external ear normal.  Left Ear: Tympanic membrane, ear canal and external ear normal.     Nose: Nose normal. No congestion.     Mouth/Throat:     Mouth: Mucous membranes are moist.      Pharynx: Oropharynx is clear. No posterior oropharyngeal erythema.  Eyes:     General: No scleral icterus.    Extraocular Movements: Extraocular movements intact.     Conjunctiva/sclera: Conjunctivae normal.     Pupils: Pupils are equal, round, and reactive to light.  Neck:     Thyroid: No thyromegaly.     Vascular: No carotid bruit or JVD.  Cardiovascular:     Rate and Rhythm: Normal rate and regular rhythm.     Pulses: Normal pulses.     Heart sounds: Normal heart sounds. No gallop.   Pulmonary:     Effort: Pulmonary effort is normal. No respiratory distress.     Breath sounds: Normal breath sounds. No wheezing.     Comments: Good air exch Chest:     Chest wall: No tenderness.  Abdominal:     General: Bowel sounds are normal. There is no distension or abdominal bruit.     Palpations: Abdomen is soft. There is no mass.     Tenderness: There is no abdominal tenderness.     Hernia: No hernia is present.  Genitourinary:    Comments: Breast exam: No mass, nodules, thickening, tenderness, bulging, retraction, inflamation, nipple discharge or skin changes noted.  No axillary or clavicular LA.     Musculoskeletal:        General: No tenderness. Normal range of motion.     Cervical back: Normal range of motion and neck supple. No rigidity. No muscular tenderness.     Right lower leg: No edema.     Left lower leg: No edema.     Comments: No kyphosis   Lymphadenopathy:     Cervical: No cervical adenopathy.  Skin:    General: Skin is warm and dry.     Coloration: Skin is not pale.     Findings: No erythema or rash.     Comments: Solar lentigines diffusely   Neurological:     Mental Status: She is alert. Mental status is at baseline.     Cranial Nerves: No cranial nerve deficit.     Motor: No abnormal muscle tone.     Coordination: Coordination normal.     Gait: Gait normal.     Deep Tendon Reflexes: Reflexes are normal and symmetric. Reflexes normal.  Psychiatric:         Mood and Affect: Mood normal.        Cognition and Memory: Cognition and memory normal.           Assessment & Plan:   Problem List Items Addressed This Visit      Digestive   GERD    Controlled with nexium   Enc inc in vit D intake given ppi use      Relevant Medications   esomeprazole (NEXIUM) 40 MG capsule     Musculoskeletal and Integument   Osteoporosis    Rev dexa from June No falls or fx Will inc vit D3 intake by 1000 iu more  Trial of alendronate (px and handout given)  Goal is 5 y of tx (dexa in 2)  Rev poss side eff incl GI (low risk of ON of jaw due to no teeth) Encouraged walking for exercise       Relevant Medications   alendronate (FOSAMAX)  8 MG tablet     Other   Vitamin D deficiency    Level in 53s In setting of OP  Agreed to add another 1000 iu vit D3 daily      Hyperlipidemia    Disc goals for lipids and reasons to control them Rev last labs with pt Rev low sat fat diet in detail LDL of 102 High HDL  Plan to continue pravastatin 20 mg daily with diet       Relevant Medications   pravastatin (PRAVACHOL) 20 MG tablet   Encounter for Medicare annual wellness exam - Primary    Reviewed health habits including diet and exercise and skin cancer prevention Reviewed appropriate screening tests for age  Also reviewed health mt list, fam hx and immunization status , as well as social and family history   See HPI Labs reviewed  One covid vaccine-unsure if she will get another Interested in shingrix vaccines if covered  Reviewed dexa and px alendronate to consider and try No falls or fractures Advance directive is utd  No cognitive concerns  Reassuring hearing screen  utd eye/vision care        Routine general medical examination at a health care facility    Reviewed health habits including diet and exercise and skin cancer prevention Reviewed appropriate screening tests for age  Also reviewed health mt list, fam hx and immunization  status , as well as social and family history   See HPI Labs reviewed  One covid vaccine-unsure if she will get another Interested in shingrix vaccines if covered  Reviewed dexa and px alendronate to consider and try No falls or fractures Advance directive is utd  No cognitive concerns  Reassuring hearing screen  utd eye/vision care       Elevated glucose level    Lab Results  Component Value Date   HGBA1C 5.6 06/24/2020   disc imp of low glycemic diet and wt loss to prevent DM2

## 2020-07-01 NOTE — Assessment & Plan Note (Signed)
Reviewed health habits including diet and exercise and skin cancer prevention Reviewed appropriate screening tests for age  Also reviewed health mt list, fam hx and immunization status , as well as social and family history   See HPI Labs reviewed  One covid vaccine-unsure if she will get another Interested in shingrix vaccines if covered  Reviewed dexa and px alendronate to consider and try No falls or fractures Advance directive is utd  No cognitive concerns  Reassuring hearing screen  utd eye/vision care

## 2020-07-01 NOTE — Assessment & Plan Note (Signed)
Controlled with nexium   Enc inc in vit D intake given ppi use

## 2020-07-01 NOTE — Assessment & Plan Note (Signed)
Disc goals for lipids and reasons to control them Rev last labs with pt Rev low sat fat diet in detail LDL of 102 High HDL  Plan to continue pravastatin 20 mg daily with diet

## 2021-01-01 ENCOUNTER — Other Ambulatory Visit: Payer: Self-pay

## 2021-01-01 ENCOUNTER — Ambulatory Visit (INDEPENDENT_AMBULATORY_CARE_PROVIDER_SITE_OTHER): Payer: Medicare Other | Admitting: Family Medicine

## 2021-01-01 DIAGNOSIS — M810 Age-related osteoporosis without current pathological fracture: Secondary | ICD-10-CM

## 2021-01-01 DIAGNOSIS — M79672 Pain in left foot: Secondary | ICD-10-CM | POA: Diagnosis not present

## 2021-01-01 DIAGNOSIS — K589 Irritable bowel syndrome without diarrhea: Secondary | ICD-10-CM

## 2021-01-01 DIAGNOSIS — K219 Gastro-esophageal reflux disease without esophagitis: Secondary | ICD-10-CM

## 2021-01-01 MED ORDER — METHYLPREDNISOLONE 4 MG PO TBPK: ORAL_TABLET | ORAL | 0 refills | Status: DC

## 2021-01-01 NOTE — Progress Notes (Signed)
Left foot pain "flaring up". Has seen Marlou Sa for this in the past. Has been told arthritis. States today isn't bad, but last few days were very bad. States she has been on feet a lot, maybe this is why she had the "flare up" Has been taking Tylenol, IcyHot, and Ice

## 2021-01-01 NOTE — Progress Notes (Signed)
Office Visit Note   Patient: Diane Delacruz           Date of Birth: [birth year]           MRN: [id number] Visit Date: 01/01/2021 Requested by: Tower, Wynelle Fanny, MD [place],  [place] [address] PCP: Abner Greenspan, MD  Subjective: Chief Complaint  Patient presents with   Left Foot - Pain    HPI: She is here with left foot pain.  She has a history of midfoot DJD.  She does well most of the time, but recently she had a flareup of pain which was severe.  It is a little bit better today.  Pain is on the dorsum of the midfoot.  No history of gout.  She does have a history of osteoporosis and is currently in discussion with her doctor about starting medication.  She takes vitamin D.  She has a history of vitamin D deficiency.  She has chronic IBS symptoms and is on proton pump inhibitors chronically for GERD.                ROS:   All other systems were reviewed and are negative.  Objective: Vital Signs: LMP 07/19/1995   Physical Exam:  General:  Alert and oriented, in no acute distress. Pulm:  Breathing unlabored. Psy:  Normal mood, congruent affect. Skin: No erythema Left foot: She has normal longitudinal arches which do not collapse with weightbearing.  She is able to walk with only a slight limp.  She is tender to palpation near the first, second and third TMT joints from the dorsal approach.  Passive manipulation of the midfoot also causes some pain.  No edema.  Pulses are 2+.    Imaging: No results found.  Assessment & Plan: Flareup of left foot midfoot DJD -Medrol Dosepak.  Referral for orthotics. -We discussed the possible association between proton pump inhibitors and osteoporosis.  Ideally she would figure out what in her diet is causing her stomach issues and eventually be able to taper off proton pump inhibitors. -Consider a short fracture boot if her pain does not improve quickly.     Procedures: No procedures performed        PMFS  History: Patient Active Problem List   Diagnosis Date Noted   Right foot pain 10/23/2019   Dizziness 03/20/2019   Cough 08/03/2017   Encounter for screening mammogram for breast cancer 06/21/2017   Elevated glucose level 06/21/2017   Estrogen deficiency 04/27/2015   Routine general medical examination at a health care facility 04/19/2015   Encounter for Medicare annual wellness exam 11/07/2012   TMJ (temporomandibular joint syndrome) 02/24/2012   Insomnia 02/21/2011   Family history of colon cancer 11/01/2010   Hyperlipidemia 11/01/2010   Vitamin D deficiency 10/10/2008   IRRITABLE BOWEL SYNDROME 10/15/2007   VARICOSE VEINS, LOWER EXTREMITIES 08/15/2007   Osteoporosis 05/14/2007   ALLERGIC RHINITIS, SEASONAL 05/11/2007   GERD 05/11/2007   FIBROCYSTIC BREAST DISEASE 05/11/2007   POSTMENOPAUSAL STATUS 05/11/2007   Past Medical History:  Diagnosis Date   Allergic rhinitis due to pollen    Allergy    Anxiety    past hx   Asymptomatic varicose veins    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 Trenton Gammon   Depression    Diffuse cystic mastopathy    Diverticulosis    Dyspnea 2008  negative echo and MV scan   Esophageal reflux    History of gallstones    Hypoglycemia, unspecified    Irritable bowel syndrome    Lumbago    Palpitations 03/06/09   event monitor   Pure hypercholesterolemia    Stress fracture of foot 03/28/99   left   Symptomatic menopausal or female climacteric states    Unspecified adverse effect of other drug, medicinal and biological substance(995.29)    Unspecified vitamin D deficiency     Family History  Problem Relation Age of Onset   Hyperlipidemia Mother    Stroke Mother    Arthritis Mother    Macular degeneration Mother    Irritable bowel syndrome Mother    Colon cancer Father 76       died 42   Heart disease Father        MI's   COPD Sister        heavy smoker   Colon  polyps Maternal Aunt    Breast cancer Paternal 47    Osteopenia Sister    Other Daughter        celiac spruce   Diabetes Brother    Diabetes Paternal Uncle    Irritable bowel syndrome Sister    Kidney disease Cousin    Rectal cancer Neg Hx    Stomach cancer Neg Hx    Esophageal cancer Neg Hx    Liver cancer Neg Hx    Pancreatic cancer Neg Hx    Prostate cancer Neg Hx     Past Surgical History:  Procedure Laterality Date   BLADDER SURGERY     tumor removed   CHOLECYSTECTOMY     gallstones- ultrasound 10/03   COLONOSCOPY     Social History   Occupational History   Occupation: retired    Fish farm manager: unemployed  Tobacco Use   Smoking status: Never   Smokeless tobacco: Never  Vaping Use   Vaping Use: Never used  Substance and Sexual Activity   Alcohol use: Yes    Alcohol/week: 0.0 standard drinks    Comment: Rare-wine   Drug use: No   Sexual activity: Never

## 2021-01-22 ENCOUNTER — Ambulatory Visit: Payer: Medicare Other | Admitting: Sports Medicine

## 2021-01-22 ENCOUNTER — Other Ambulatory Visit: Payer: Self-pay

## 2021-01-22 VITALS — BP 108/66 | Ht 62.0 in | Wt 140.0 lb

## 2021-01-22 DIAGNOSIS — Q667 Congenital pes cavus, unspecified foot: Secondary | ICD-10-CM

## 2021-01-22 DIAGNOSIS — M19072 Primary osteoarthritis, left ankle and foot: Secondary | ICD-10-CM

## 2021-01-22 DIAGNOSIS — Q6672 Congenital pes cavus, left foot: Secondary | ICD-10-CM

## 2021-01-22 NOTE — Progress Notes (Addendum)
   Subjective:    Patient ID: Diane Delacruz, female    DOB: [birth year], 75 y.o.   MRN: [id number]  HPI chief complaint: Custom orthotics  Very pleasant 75 year old female comes in today at the request of Dr. Junius Roads for custom orthotics.  She has a history of left foot midfoot DJD.  She was recently treated with some steroids and her symptoms have improved.  Dr. Junius Roads felt like custom orthotics may be appropriate for her.  She localizes all of her pain to the dorsum of her foot in the midfoot area.  She has not had custom orthotics in the past.  Past medical history reviewed Medications reviewed Allergies reviewed    Review of Systems As above    Objective:   Physical Exam  Well-developed, well-nourished.  No acute distress  Left foot: Pes cavus foot.  Dorsal bossing of the midfoot.  Mild tenderness to palpation in this area but no soft tissue swelling.  Normal transverse arch.  Good pulses.  Neurovascular intact distally.  X-rays of the left foot are reviewed.  There is evidence of moderate midfoot DJD.  Incidental finding of os perineum as well.     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 Elba Barman under direct supervision.  Patient found the orthotics to be comfortable prior to leaving the office.  Gait was observed to be neutral with orthotics in place.  Hopefully the added arch support from the orthotic will help prevent future flareups of her midfoot DJD.  She will return to Dr. Junius Roads and Dr. Marlou Sa as scheduled and will follow up with me as needed.    Patient was fitted for a : standard, cushioned, semi-rigid orthotic. The orthotic was heated and afterward the patient stood on the orthotic blank positioned on the orthotic stand. The patient was positioned in subtalar neutral position and 10 degrees of ankle dorsiflexion in a weight bearing stance. After completion  of molding, a stable base was applied to the orthotic blank. The blank was ground to a stable position for weight bearing. Size: 6 Base: Blue EVA Posting:none Additional orthotic padding: none

## 2021-02-11 DIAGNOSIS — H52223 Regular astigmatism, bilateral: Secondary | ICD-10-CM | POA: Diagnosis not present

## 2021-02-11 DIAGNOSIS — H2513 Age-related nuclear cataract, bilateral: Secondary | ICD-10-CM | POA: Diagnosis not present

## 2021-02-11 DIAGNOSIS — H5203 Hypermetropia, bilateral: Secondary | ICD-10-CM | POA: Diagnosis not present

## 2021-02-11 DIAGNOSIS — H524 Presbyopia: Secondary | ICD-10-CM | POA: Diagnosis not present

## 2021-07-04 ENCOUNTER — Telehealth: Payer: Self-pay | Admitting: Family Medicine

## 2021-07-04 DIAGNOSIS — E559 Vitamin D deficiency, unspecified: Secondary | ICD-10-CM

## 2021-07-04 DIAGNOSIS — E7800 Pure hypercholesterolemia, unspecified: Secondary | ICD-10-CM

## 2021-07-04 DIAGNOSIS — M810 Age-related osteoporosis without current pathological fracture: Secondary | ICD-10-CM

## 2021-07-04 DIAGNOSIS — Z0000 Encounter for general adult medical examination without abnormal findings: Secondary | ICD-10-CM

## 2021-07-04 DIAGNOSIS — R7309 Other abnormal glucose: Secondary | ICD-10-CM

## 2021-07-04 NOTE — Telephone Encounter (Signed)
-----   Message from Ellamae Sia sent at 06/21/2021  8:26 AM EST ----- Regarding: Lab orders for Monday, 12.19.22 Patient is scheduled for CPX labs, please order future labs, Thanks , Karna Christmas

## 2021-07-05 ENCOUNTER — Encounter: Payer: Medicare Other | Admitting: Family Medicine

## 2021-07-05 ENCOUNTER — Other Ambulatory Visit: Payer: Medicare Other

## 2021-07-14 ENCOUNTER — Encounter: Payer: Medicare Other | Admitting: Family Medicine

## 2021-08-09 ENCOUNTER — Ambulatory Visit (INDEPENDENT_AMBULATORY_CARE_PROVIDER_SITE_OTHER): Payer: Medicare Other | Admitting: Family Medicine

## 2021-08-09 ENCOUNTER — Encounter: Payer: Self-pay | Admitting: Family Medicine

## 2021-08-09 ENCOUNTER — Other Ambulatory Visit: Payer: Self-pay

## 2021-08-09 VITALS — BP 126/72 | HR 71 | Temp 97.9°F | Ht 62.0 in | Wt 144.1 lb

## 2021-08-09 DIAGNOSIS — Z23 Encounter for immunization: Secondary | ICD-10-CM | POA: Diagnosis not present

## 2021-08-09 DIAGNOSIS — E559 Vitamin D deficiency, unspecified: Secondary | ICD-10-CM | POA: Diagnosis not present

## 2021-08-09 DIAGNOSIS — K219 Gastro-esophageal reflux disease without esophagitis: Secondary | ICD-10-CM | POA: Diagnosis not present

## 2021-08-09 DIAGNOSIS — R7309 Other abnormal glucose: Secondary | ICD-10-CM

## 2021-08-09 DIAGNOSIS — E7800 Pure hypercholesterolemia, unspecified: Secondary | ICD-10-CM | POA: Diagnosis not present

## 2021-08-09 DIAGNOSIS — E538 Deficiency of other specified B group vitamins: Secondary | ICD-10-CM | POA: Insufficient documentation

## 2021-08-09 DIAGNOSIS — Z79899 Other long term (current) drug therapy: Secondary | ICD-10-CM | POA: Insufficient documentation

## 2021-08-09 DIAGNOSIS — J301 Allergic rhinitis due to pollen: Secondary | ICD-10-CM

## 2021-08-09 DIAGNOSIS — Z0000 Encounter for general adult medical examination without abnormal findings: Secondary | ICD-10-CM

## 2021-08-09 DIAGNOSIS — Z800 Family history of malignant neoplasm of digestive organs: Secondary | ICD-10-CM

## 2021-08-09 DIAGNOSIS — R7989 Other specified abnormal findings of blood chemistry: Secondary | ICD-10-CM | POA: Insufficient documentation

## 2021-08-09 LAB — CBC WITH DIFFERENTIAL/PLATELET
Basophils Absolute: 0 10*3/uL (ref 0.0–0.1)
Basophils Relative: 0.5 % (ref 0.0–3.0)
Eosinophils Absolute: 0.2 10*3/uL (ref 0.0–0.7)
Eosinophils Relative: 3.7 % (ref 0.0–5.0)
HCT: 36.7 % (ref 36.0–46.0)
Hemoglobin: 12.2 g/dL (ref 12.0–15.0)
Lymphocytes Relative: 40.6 % (ref 12.0–46.0)
Lymphs Abs: 2.2 10*3/uL (ref 0.7–4.0)
MCHC: 33.2 g/dL (ref 30.0–36.0)
MCV: 91.8 fl (ref 78.0–100.0)
Monocytes Absolute: 0.4 10*3/uL (ref 0.1–1.0)
Monocytes Relative: 7 % (ref 3.0–12.0)
Neutro Abs: 2.6 10*3/uL (ref 1.4–7.7)
Neutrophils Relative %: 48.2 % (ref 43.0–77.0)
Platelets: 285 10*3/uL (ref 150.0–400.0)
RBC: 4 Mil/uL (ref 3.87–5.11)
RDW: 12.6 % (ref 11.5–15.5)
WBC: 5.5 10*3/uL (ref 4.0–10.5)

## 2021-08-09 LAB — COMPREHENSIVE METABOLIC PANEL
ALT: 11 U/L (ref 0–35)
AST: 15 U/L (ref 0–37)
Albumin: 3.8 g/dL (ref 3.5–5.2)
Alkaline Phosphatase: 92 U/L (ref 39–117)
BUN: 10 mg/dL (ref 6–23)
CO2: 27 mEq/L (ref 19–32)
Calcium: 9.2 mg/dL (ref 8.4–10.5)
Chloride: 105 mEq/L (ref 96–112)
Creatinine, Ser: 0.93 mg/dL (ref 0.40–1.20)
GFR: 60.28 mL/min (ref >60.00)
Glucose, Bld: 99 mg/dL (ref 70–99)
Potassium: 4 mEq/L (ref 3.5–5.1)
Sodium: 140 mEq/L (ref 135–145)
Total Bilirubin: 0.5 mg/dL (ref 0.2–1.2)
Total Protein: 6.4 g/dL (ref 6.0–8.3)

## 2021-08-09 LAB — VITAMIN B12: Vitamin B-12: 140 pg/mL — ABNORMAL LOW (ref 211–911)

## 2021-08-09 LAB — LIPID PANEL
Cholesterol: 144 mg/dL (ref 0–200)
HDL: 57.7 mg/dL (ref >39.00)
LDL Cholesterol: 73 mg/dL (ref 0–99)
NonHDL: 86.08
Total CHOL/HDL Ratio: 2
Triglycerides: 67 mg/dL (ref 0.0–149.0)
VLDL: 13.4 mg/dL (ref 0.0–40.0)

## 2021-08-09 LAB — TSH: TSH: 0.01 u[IU]/mL — ABNORMAL LOW (ref 0.35–5.50)

## 2021-08-09 LAB — VITAMIN D 25 HYDROXY (VIT D DEFICIENCY, FRACTURES): VITD: 44.89 ng/mL (ref 30.00–100.00)

## 2021-08-09 LAB — HEMOGLOBIN A1C: Hgb A1c MFr Bld: 5.7 % (ref 4.6–6.5)

## 2021-08-09 MED ORDER — PRAVASTATIN SODIUM 20 MG PO TABS: 20.0000 mg | ORAL_TABLET | Freq: Every day | ORAL | 3 refills | Status: DC

## 2021-08-09 MED ORDER — ESOMEPRAZOLE MAGNESIUM 40 MG PO CPDR: 40.0000 mg | DELAYED_RELEASE_CAPSULE | Freq: Every day | ORAL | 3 refills | Status: DC

## 2021-08-09 NOTE — Assessment & Plan Note (Signed)
Would like to eventually get off nexium 40 mg  Will start QOD Expect some rebound to start- urged to keep antacid on hand

## 2021-08-09 NOTE — Assessment & Plan Note (Signed)
Disc goals for lipids and reasons to control them Rev last labs with pt Rev low sat fat diet in detail Labs ordered  Takes pravastatin 20 mg daily and tolerates well

## 2021-08-09 NOTE — Assessment & Plan Note (Signed)
Some recent dizzy spells and ear pressure/popping  Encouraged pt to use nasonex daily instead of prn and update

## 2021-08-09 NOTE — Assessment & Plan Note (Signed)
Reviewed health habits including diet and exercise and skin cancer prevention Reviewed appropriate screening tests for age  Also reviewed health mt list, fam hx and immunization status , as well as social and family history   See HPI Labs ordered Colonoscopy utd Mammogram is due-given # to schedule it  Flu shot given  Considering shingrix if affordable  dexa due in June , no falls or fractures and now good exercise and ca/D    Advance directive is utd  Nl hearing screen and eye/vision care is utd PHQ score of 0 No help needed with ADLs Good functionality

## 2021-08-09 NOTE — Patient Instructions (Addendum)
If you are interested in the new shingles vaccine (Shingrix) - call your local pharmacy to check on coverage and availability  If affordable, get on a wait list at your pharmacy to get the vaccine.   Labs today   Use nasonex daily   It you want to get off nexium take it every other day for a while and see how you do      Call to schedule your mammogram  Please call the location of your choice from the menu below to schedule your Mammogram and/or Bone Density appointment.    [facility]                      Phone:  [phone], [place] [address]                                                             Services: Traditional and 3D Mammogram, [place] [facility]                 Phone: [phone] [address], [place] [address]    Service: Bone Density ONLY   *this site does NOT perform mammograms  [facility]                        Phone:  [phone] [address], [place] [address]                                            Services:  3D Mammogram and [place] at [facility]   Phone:  [phone]   [place], [place] [address]  Services: 3D Mammogram and Bone Density  [place] [facility])  Phone:  [phone]   [address] Room [place], [place] [address]                                              Services:  3D Mammogram and Bone Density

## 2021-08-09 NOTE — Assessment & Plan Note (Signed)
A1C ordered °disc imp of low glycemic diet and wt loss to prevent DM2  °

## 2021-08-09 NOTE — Assessment & Plan Note (Signed)
Vitamin B12 level added to labs Would like to wean ppi in the future

## 2021-08-09 NOTE — Progress Notes (Signed)
Subjective:    Patient ID: Diane Delacruz, female    DOB: 1946/03/05, 76 y.o.   MRN: [id number]  This visit occurred during the SARS-CoV-2 public health emergency.  Safety protocols were in place, including screening questions prior to the visit, additional usage of staff PPE, and extensive cleaning of exam room while observing appropriate contact time as indicated for disinfecting solutions.   HPI Pt presents for amw and health mt visit   I have personally reviewed the Medicare Annual Wellness questionnaire and have noted 1. The patient's medical and social history 2. Their use of alcohol, tobacco or illicit drugs 3. Their current medications and supplements 4. The patient's functional ability including ADL's, fall risks, home safety risks and hearing or visual             impairment. 5. Diet and physical activities 6. Evidence for depression or mood disorders  The patients weight, height, BMI have been recorded in the chart and visual acuity is per eye clinic.  I have made referrals, counseling and provided education to the patient based review of the above and I have provided the pt with a written personalized care plan for preventive services. Reviewed and updated provider list, see scanned forms.  See scanned forms.  Routine anticipatory guidance given to patient.  See health maintenance. Colon cancer screening  colonoscopy 09/2017  Breast cancer screening: 2021 -due for and she will make appt Self breast exam: neg  Flu vaccine: flu shot today  Had covid vaccine-one Tetanus vaccine 01/2019 Td Pneumovax-completed Zoster vaccine:  would consider if it is covered (has had shingles twice)  Dexa 12/2019 osteoporosis  Falls: none Fractures:none  Supplements: ca plus D Exercise : very active/works physically  Walking more regularly and [facility]    Advance directive: up to date  PMH and [place] reviewed  Meds, vitals, and allergies reviewed.    ROS: See HPI.  Otherwise negative.    Weight : Wt Readings from Last 3 Encounters:  08/09/21 144 lb 2 oz (65.4 kg)  01/22/21 140 lb (63.5 kg)  07/01/20 148 lb 1 oz (67.2 kg)   26.36 kg/m  Last few days had a dizzy spell here and there  Some sinus congestion and feels like ears are full (no pain)  Hearing/vision: Hearing Screening   500Hz  1000Hz  2000Hz  4000Hz   Right ear 40 40 40 40  Left ear 40 40 40 40  Vision Screening - Comments:: Eye exam June 2022 at [facility].   PHQ: Depression screen [facility] 2/9 08/09/2021 07/01/2020 06/20/2019 06/18/2018 06/12/2017  Decreased Interest 0 0 0 0 0  Down, Depressed, Hopeless 0 0 0 0 0  PHQ - 2 Score 0 0 0 0 0  Altered sleeping - - 0 0 0  Tired, decreased energy - - 0 0 0  Change in appetite - - 0 0 0  Feeling bad or failure about yourself  - - 0 0 0  Trouble concentrating - - 0 0 0  Moving slowly or fidgety/restless - - 0 0 0  Suicidal thoughts - - 0 0 0  PHQ-9 Score - - 0 0 0  Difficult doing work/chores - - Not difficult at all Not difficult at all Not difficult at all     ADLs:  normal   Functionality: very good   Care team : Dirck Butch-pcp Bulakowski-opt Stinehelfer- derm HIlts-ortho, also Dean  BP Readings from Last 3 Encounters:  08/09/21 126/72  01/22/21 108/66  07/01/20 110/62   Pulse Readings from Last 3 Encounters:  08/09/21 71  07/01/20 69  04/21/20 62      H/o elevated glucose in the past Lab Results  Component Value Date   HGBA1C 5.6 06/24/2020    Cholesterol Lab Results  Component Value Date   CHOL 190 06/24/2020   HDL 70.80 06/24/2020   LDLCALC 102 (H) 06/24/2020   LDLDIRECT 170.5 04/30/2012   TRIG 86.0 06/24/2020   CHOLHDL 3 06/24/2020   Takes pravastatin 20 mg daily Due for labs   GERD Takes nexium 40 mg daily   Patient Active Problem List   Diagnosis Date Noted   Current use of proton pump inhibitor 08/09/2021   Right foot pain 10/23/2019   Dizziness 03/20/2019   Encounter  for screening mammogram for breast cancer 06/21/2017   Elevated glucose level 06/21/2017   Estrogen deficiency 04/27/2015   Routine general medical examination at a health care facility 04/19/2015   Encounter for Medicare annual wellness exam 11/07/2012   TMJ (temporomandibular joint syndrome) 02/24/2012   Insomnia 02/21/2011   Family history of colon cancer 11/01/2010   Hyperlipidemia 11/01/2010   Vitamin D deficiency 10/10/2008   IRRITABLE BOWEL SYNDROME 10/15/2007   VARICOSE VEINS, LOWER EXTREMITIES 08/15/2007   Osteoporosis 05/14/2007   ALLERGIC RHINITIS, SEASONAL 05/11/2007   GERD 05/11/2007   FIBROCYSTIC BREAST DISEASE 05/11/2007   POSTMENOPAUSAL STATUS 05/11/2007   Past Medical History:  Diagnosis Date   Allergic rhinitis due to pollen    Allergy    Anxiety    past hx   Asymptomatic varicose veins    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 Trenton Gammon   Depression    Diffuse cystic mastopathy    Diverticulosis    Dyspnea 2008   negative echo and MV scan   Esophageal reflux    History of gallstones    Hypoglycemia, unspecified    Irritable bowel syndrome    Lumbago    Palpitations 03/06/09   event monitor   Pure hypercholesterolemia    Stress fracture of foot 03/28/99   left   Symptomatic menopausal or female climacteric states    Unspecified adverse effect of other drug, medicinal and biological substance(995.29)    Unspecified vitamin D deficiency    Past Surgical History:  Procedure Laterality Date   BLADDER SURGERY     tumor removed   CHOLECYSTECTOMY     gallstones- ultrasound 10/03   COLONOSCOPY     Social History   Tobacco Use   Smoking status: Never   Smokeless tobacco: Never  Vaping Use   Vaping Use: Never used  Substance Use Topics   Alcohol use: Yes    Alcohol/week: 0.0 standard drinks    Comment: Rare-wine   Drug use: No   Family History  Problem Relation Age of  Onset   Hyperlipidemia Mother    Stroke Mother    Arthritis Mother    Macular degeneration Mother    Irritable bowel syndrome Mother    Colon cancer Father 97       died 59   Heart disease Father        MI's   COPD Sister        heavy smoker   Colon polyps Maternal Aunt    Breast cancer Paternal Aunt    Osteopenia Sister    Other Daughter  celiac spruce   Diabetes Brother    Diabetes Paternal Uncle    Irritable bowel syndrome Sister    Kidney disease Cousin    Rectal cancer Neg Hx    Stomach cancer Neg Hx    Esophageal cancer Neg Hx    Liver cancer Neg Hx    Pancreatic cancer Neg Hx    Prostate cancer Neg Hx    Allergies  Allergen Reactions   Amoxicillin     Rash and diarrhea    Codeine     REACTION: unknown reaction   Flexeril [Cyclobenzaprine]     sedation   Klonopin [Clonazepam]     Dizziness , fatigue   Nsaids     REACTION: diarrhea   Omeprazole     REACTION: did not work for her   Pseudoephedrine     REACTION: jittery   Tessalon [Benzonatate] Rash    Rash   Current Outpatient Medications on File Prior to Visit  Medication Sig Dispense Refill   Calcium-Magnesium-Vitamin D (CALCIUM 1200+D3 PO) Take 1 tablet by mouth daily.     fexofenadine (ALLEGRA) 60 MG tablet Take 180 mg by mouth daily as needed for allergies.     mometasone (NASONEX) 50 MCG/ACT nasal spray Place 2 sprays into the nose daily as needed (allergies). 17 g 11   No current facility-administered medications on file prior to visit.     Review of Systems  Constitutional:  Negative for activity change, appetite change, fatigue, fever and unexpected weight change.  HENT:  Negative for congestion, ear pain, rhinorrhea, sinus pressure and sore throat.   Eyes:  Negative for pain, redness and visual disturbance.  Respiratory:  Negative for cough, shortness of breath and wheezing.   Cardiovascular:  Negative for chest pain and palpitations.  Gastrointestinal:  Negative for abdominal pain,  blood in stool, constipation and diarrhea.  Endocrine: Negative for polydipsia and polyuria.  Genitourinary:  Negative for dysuria, frequency and urgency.  Musculoskeletal:  Negative for arthralgias, back pain and myalgias.  Skin:  Negative for pallor and rash.  Allergic/Immunologic: Negative for environmental allergies.  Neurological:  Negative for dizziness, syncope and headaches.  Hematological:  Negative for adenopathy. Does not bruise/bleed easily.  Psychiatric/Behavioral:  Negative for decreased concentration and dysphoric mood. The patient is not nervous/anxious.       Objective:   Physical Exam Constitutional:      General: She is not in acute distress.    Appearance: Normal appearance. She is well-developed and normal weight. She is not ill-appearing or diaphoretic.  HENT:     Head: Normocephalic and atraumatic.     Right Ear: Tympanic membrane, ear canal and external ear normal.     Left Ear: Tympanic membrane, ear canal and external ear normal.     Nose: Nose normal. No congestion.     Mouth/Throat:     Mouth: Mucous membranes are moist.     Pharynx: Oropharynx is clear. No posterior oropharyngeal erythema.  Eyes:     General: No scleral icterus.    Extraocular Movements: Extraocular movements intact.     Conjunctiva/sclera: Conjunctivae normal.     Pupils: Pupils are equal, round, and reactive to light.  Neck:     Thyroid: No thyromegaly.     Vascular: No carotid bruit or JVD.  Cardiovascular:     Rate and Rhythm: Normal rate and regular rhythm.     Pulses: Normal pulses.     Heart sounds: Normal heart sounds.    No gallop.  Pulmonary:     Effort: Pulmonary effort is normal. No respiratory distress.     Breath sounds: Normal breath sounds. No wheezing.     Comments: Good air exch Chest:     Chest wall: No tenderness.  Abdominal:     General: Bowel sounds are normal. There is no distension or abdominal bruit.     Palpations: Abdomen is soft. There is no mass.      Tenderness: There is no abdominal tenderness.     Hernia: No hernia is present.  Genitourinary:    Comments: Breast exam: No mass, nodules, thickening, tenderness, bulging, retraction, inflamation, nipple discharge or skin changes noted.  No axillary or clavicular LA.     Musculoskeletal:        General: No tenderness. Normal range of motion.     Cervical back: Normal range of motion and neck supple. No rigidity. No muscular tenderness.     Right lower leg: No edema.     Left lower leg: No edema.     Comments: No kyphosis   Lymphadenopathy:     Cervical: No cervical adenopathy.  Skin:    General: Skin is warm and dry.     Coloration: Skin is not pale.     Findings: No erythema or rash.     Comments: Solar lentigines diffusely Few sks  Neurological:     Mental Status: She is alert. Mental status is at baseline.     Cranial Nerves: No cranial nerve deficit.     Motor: No abnormal muscle tone.     Coordination: Coordination normal.     Gait: Gait normal.     Deep Tendon Reflexes: Reflexes are normal and symmetric. Reflexes normal.  Psychiatric:        Mood and Affect: Mood normal.        Cognition and Memory: Cognition and memory normal.          Assessment & Plan:   Problem List Items Addressed This Visit       Respiratory   ALLERGIC RHINITIS, SEASONAL    Some recent dizzy spells and ear pressure/popping  Encouraged pt to use nasonex daily instead of prn and update        Digestive   GERD    Would like to eventually get off nexium 40 mg  Will start QOD Expect some rebound to start- urged to keep antacid on hand      Relevant Medications   esomeprazole (NEXIUM) 40 MG capsule     Other   Vitamin D deficiency   Family history of colon cancer    Colonoscopy done 2019 with 5 year recall      Hyperlipidemia    Disc goals for lipids and reasons to control them Rev last labs with pt Rev low sat fat diet in detail Labs ordered  Takes pravastatin 20 mg  daily and tolerates well      Relevant Medications   pravastatin (PRAVACHOL) 20 MG tablet   Encounter for Medicare annual wellness exam - Primary    Reviewed health habits including diet and exercise and skin cancer prevention Reviewed appropriate screening tests for age  Also reviewed health mt list, fam hx and immunization status , as well as social and family history   See HPI Labs ordered Colonoscopy utd Mammogram is due-given # to schedule it  Flu shot given  Considering shingrix if affordable  dexa due in June , no falls or fractures and now good exercise and ca/D  Advance directive is utd  Nl hearing screen and eye/vision care is utd PHQ score of 0 No help needed with ADLs Good functionality        Routine general medical examination at a health care facility    Reviewed health habits including diet and exercise and skin cancer prevention Reviewed appropriate screening tests for age  Also reviewed health mt list, fam hx and immunization status , as well as social and family history   See HPI Labs ordered Colonoscopy utd Mammogram is due-given # to schedule it  Flu shot given  Considering shingrix if affordable  dexa due in June , no falls or fractures and now good exercise and ca/D    Advance directive is utd  Nl hearing screen and eye/vision care is utd PHQ score of 0 No help needed with ADLs Good functionality       Relevant Orders   Flu Vaccine QUAD High Dose(Fluad) (Completed)   Elevated glucose level    A1C ordered disc imp of low glycemic diet and wt loss to prevent DM2       Current use of proton pump inhibitor    Vitamin B12 level added to labs Would like to wean ppi in the future       Relevant Orders   Vitamin B12 (Completed)   Other Visit Diagnoses     Need for influenza vaccination       Relevant Orders   Flu Vaccine QUAD High Dose(Fluad) (Completed)

## 2021-08-09 NOTE — Assessment & Plan Note (Signed)
Colonoscopy done 2019 with 5 year recall

## 2021-08-11 ENCOUNTER — Telehealth: Payer: Self-pay | Admitting: Family Medicine

## 2021-08-11 ENCOUNTER — Other Ambulatory Visit: Payer: Self-pay

## 2021-08-11 ENCOUNTER — Other Ambulatory Visit (INDEPENDENT_AMBULATORY_CARE_PROVIDER_SITE_OTHER): Payer: Medicare Other

## 2021-08-11 ENCOUNTER — Ambulatory Visit (INDEPENDENT_AMBULATORY_CARE_PROVIDER_SITE_OTHER): Payer: Medicare Other

## 2021-08-11 ENCOUNTER — Telehealth: Payer: Self-pay | Admitting: *Deleted

## 2021-08-11 DIAGNOSIS — R7989 Other specified abnormal findings of blood chemistry: Secondary | ICD-10-CM

## 2021-08-11 DIAGNOSIS — E538 Deficiency of other specified B group vitamins: Secondary | ICD-10-CM | POA: Diagnosis not present

## 2021-08-11 MED ORDER — CYANOCOBALAMIN 1000 MCG/ML IJ SOLN
1000.0000 ug | Freq: Once | INTRAMUSCULAR | Status: AC
  Administered 2021-08-11: 15:00:00 1000 ug via INTRAMUSCULAR

## 2021-08-11 NOTE — Telephone Encounter (Signed)
Lab orders

## 2021-08-11 NOTE — Telephone Encounter (Signed)
Opened in error

## 2021-08-11 NOTE — Progress Notes (Signed)
Per orders of Dr. Glori Bickers, an injection of B12 was given by Ophelia Shoulder, CMA in the right deltoid. Patient tolerated injection well.

## 2021-08-12 LAB — TSH: TSH: 0 u[IU]/mL — ABNORMAL LOW (ref 0.35–5.50)

## 2021-08-12 LAB — T3, FREE: T3, Free: 5.8 pg/mL — ABNORMAL HIGH (ref 2.3–4.2)

## 2021-08-12 LAB — T4, FREE: Free T4: 1.46 ng/dL (ref 0.60–1.60)

## 2021-08-12 LAB — T3 UPTAKE: T3 Uptake: 32 % (ref 22–35)

## 2021-08-17 ENCOUNTER — Telehealth: Payer: Self-pay | Admitting: Family Medicine

## 2021-08-17 DIAGNOSIS — R7989 Other specified abnormal findings of blood chemistry: Secondary | ICD-10-CM

## 2021-08-17 NOTE — Telephone Encounter (Signed)
-----   Message from Tammi Sou, [place] sent at 08/17/2021 12:33 PM EST ----- Pt viewed results via mychart and I also relayed lab result to pt. Pt agrees with Endo referral she would like to see someone in [place] if possible

## 2021-08-17 NOTE — Telephone Encounter (Signed)
Referral is done  It may take a while  Call the office in about 2 weeks if you don't hear by then

## 2021-08-18 NOTE — Telephone Encounter (Signed)
Pt advised.

## 2021-09-23 ENCOUNTER — Other Ambulatory Visit: Payer: Self-pay

## 2021-09-23 ENCOUNTER — Ambulatory Visit (INDEPENDENT_AMBULATORY_CARE_PROVIDER_SITE_OTHER): Payer: Medicare Other | Admitting: Internal Medicine

## 2021-09-23 ENCOUNTER — Encounter: Payer: Self-pay | Admitting: Internal Medicine

## 2021-09-23 VITALS — BP 120/74 | HR 69 | Wt 142.0 lb

## 2021-09-23 DIAGNOSIS — E0590 Thyrotoxicosis, unspecified without thyrotoxic crisis or storm: Secondary | ICD-10-CM | POA: Diagnosis not present

## 2021-09-23 LAB — T4, FREE: Free T4: 1.06 ng/dL (ref 0.60–1.60)

## 2021-09-23 LAB — TSH: TSH: 0.01 u[IU]/mL — ABNORMAL LOW (ref 0.35–5.50)

## 2021-09-23 NOTE — Progress Notes (Signed)
Name: Diane Delacruz  MRN/ DOB: [id number], 12-Feb-1946    Age/ Sex: 76 y.o., female    PCP: Abner Greenspan, MD   Reason for Endocrinology Evaluation: Hyperthyroidism     Date of Initial Endocrinology Evaluation: 09/23/2021     HPI: [name] is a 76 y.o. female with a past medical history of Osteoporosis . The patient presented for initial endocrinology clinic visit on 09/23/2021 for consultative assistance with her Hyperthyroidism .   Pt has been diagnosed with hyperthyroidism in 07/2021 with a suppressed TSH at 0.01 and elevated FT4 at 5.8 pg/mL    Of note the pt has osteoporosis     Pt has been noted with weight loss since 2021  Denies local neck swelling  She had cold symptoms with negative COVID in 06/2021 Denies palpitations Has noted loose stools that she attributes to IBS and diverticulitis  Has noted slight worsening in irritability  Has noted hand tremors    Mother with thyroid disease ( hypothyroidism )       HISTORY:  Past Medical History:  Past Medical History:  Diagnosis Date   Allergic rhinitis due to pollen    Allergy    Anxiety    past hx   Asymptomatic varicose veins    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 Trenton Gammon   Depression    Diffuse cystic mastopathy    Diverticulosis    Dyspnea 2008   negative echo and MV scan   Esophageal reflux    History of gallstones    Hypoglycemia, unspecified    Irritable bowel syndrome    Lumbago    Palpitations 03/06/09   event monitor   Pure hypercholesterolemia    Stress fracture of foot 03/28/99   left   Symptomatic menopausal or female climacteric states    Unspecified adverse effect of other drug, medicinal and biological substance(995.29)    Unspecified vitamin D deficiency    Past Surgical History:  Past Surgical History:  Procedure Laterality Date   BLADDER SURGERY     tumor removed   CHOLECYSTECTOMY      gallstones- ultrasound 10/03   COLONOSCOPY      Social History:  reports that she has never smoked. She has never used smokeless tobacco. She reports current alcohol use. She reports that she does not use drugs. Family History: family history includes Arthritis in her mother; Breast cancer in her paternal aunt; COPD in her sister; Colon cancer (age of onset: 67) in her father; Colon polyps in her maternal aunt; Diabetes in her brother and paternal uncle; Heart disease in her father; Hyperlipidemia in her mother; Irritable bowel syndrome in her mother and sister; Kidney disease in her cousin; Macular degeneration in her mother; Osteopenia in her sister; Other in her daughter; Stroke in her mother.   HOME MEDICATIONS: Allergies as of 09/23/2021       Reactions   Amoxicillin    Rash and diarrhea    Codeine    REACTION: unknown reaction   Flexeril [cyclobenzaprine]    sedation   Klonopin [clonazepam]    Dizziness , fatigue   Nsaids    REACTION: diarrhea   Omeprazole    REACTION: did not work for her   Pseudoephedrine    REACTION: jittery   Tessalon [benzonatate] Rash   Rash        Medication List  Accurate as of September 23, 2021 11:33 AM. If you have any questions, ask your nurse or doctor.          B-12 1000 MCG Tabs   CALCIUM 1200+D3 PO Take 1 tablet by mouth daily.   esomeprazole 40 MG capsule Commonly known as: NEXIUM Take 1 capsule (40 mg total) by mouth daily at 12 noon.   fexofenadine 60 MG tablet Commonly known as: ALLEGRA Take 180 mg by mouth daily as needed for allergies.   mometasone 50 MCG/ACT nasal spray Commonly known as: NASONEX Place 2 sprays into the nose daily as needed (allergies).   pravastatin 20 MG tablet Commonly known as: PRAVACHOL Take 1 tablet (20 mg total) by mouth daily.   Qunol Ultra CoQ10 100-150 MG-UNIT Caps Generic drug: Coenzyme Q10-Vitamin E          REVIEW OF SYSTEMS: A comprehensive ROS was conducted with the  patient and is negative except as per HPI    OBJECTIVE:  VS: BP 120/74 (BP Location: Left Arm, Patient Position: Sitting, Cuff Size: Small)    Pulse 69    Wt 142 lb (64.4 kg)    LMP 07/19/1995    SpO2 97%    BMI 25.97 kg/m    Wt Readings from Last 3 Encounters:  09/23/21 142 lb (64.4 kg)  08/09/21 144 lb 2 oz (65.4 kg)  01/22/21 140 lb (63.5 kg)     EXAM: General: Pt appears well and is in NAD  Eyes: External eye exam normal without stare, lid lag or exophthalmos.  EOM intact.  PERRL.  Neck: General: Supple without adenopathy. Thyroid: Thyroid size normal.  No goiter or nodules appreciated.   Lungs: Clear with good BS bilat with no rales, rhonchi, or wheezes  Heart: Auscultation: RRR.  Abdomen: Normoactive bowel sounds, soft, nontender, without masses or organomegaly palpable  Extremities:  BL LE: No pretibial edema normal ROM and strength.  Mental Status: Judgment, insight: Intact Orientation: Oriented to time, place, and person Mood and affect: No depression, anxiety, or agitation     DATA REVIEWED:  Latest Reference Range & Units 09/23/21 11:59  TSH 0.35 - 5.50 uIU/mL 0.01 (L)  T4,Free(Direct) 0.60 - 1.60 ng/dL 1.06      Latest Reference Range & Units 08/09/21 10:29  Sodium 135 - 145 mEq/L 140  Potassium 3.5 - 5.1 mEq/L 4.0  Chloride 96 - 112 mEq/L 105  CO2 19 - 32 mEq/L 27  Glucose 70 - 99 mg/dL 99  BUN 6 - 23 mg/dL 10  Creatinine 0.40 - 1.20 mg/dL 0.93  Calcium 8.4 - 10.5 mg/dL 9.2  Alkaline Phosphatase 39 - 117 U/L 92  Albumin 3.5 - 5.2 g/dL 3.8  AST 0 - 37 U/L 15  ALT 0 - 35 U/L 11  Total Protein 6.0 - 8.3 g/dL 6.4  Total Bilirubin 0.2 - 1.2 mg/dL 0.5  GFR >60.00 mL/min 60.28   ASSESSMENT/PLAN/RECOMMENDATIONS:   Hyperthyroidism:  -We discussed differential diagnosis of Graves' disease, autonomous thyroid nodule, subacute thyroiditis  - We discussed that Graves' Disease is a result of an autoimmune condition involving the thyroid.    We discussed  with pt the benefits of methimazole in the Tx of hyperthyroidism, as well as the possible side effects/complications of anti-thyroid drug Tx (specifically detailing the rare, but serious side effect of agranulocytosis). She was informed of need for regular thyroid function monitoring while on methimazole to ensure appropriate dosage without over-treatment. As well, we discussed the possible side effects of methimazole  including the chance of rash, the small chance of liver irritation/juandice and the <=1 in 300-400 chance of sudden onset agranulocytosis.  We discussed importance of going to ED promptly (and stopping methimazole) if shewere to develop significant fever with severe sore throat of other evidence of acute infection.     We discussed appropriate activity/work limitations while pt is hyperthyroid.  Also recommended no driving until her hyperthyroidism improves.  She should not operate machinery or drive, should not handle explosives, other hazardous materials, firearms or high voltage, nor work at heights while hyperthyroid.  She should avoid heavy physical exertion and heat stress and should avoid work that requires high attention to detail while hyperthyroid.  We extensively discussed the various treatment options for hyperthyroidism and Graves disease including ablation therapy with radioactive iodine versus antithyroid drug treatment versus surgical therapy.  We recommended to the patient that we felt, at this time, that thionamide therapy would be most optimal.  We discussed the various possible benefits versus side effects of the various therapies.   I carefully explained to the patient that one of the consequences of I-131 ablation treatment would likely be permanent hypothyroidism which would require long-term replacement therapy with LT4. -TRAb pending -We will order thyroid ultrasound  Medications : Start methimazole 5 mg daily   Follow-up in 3 months Labs in 6 weeks  Signed  electronically by: Mack Guise, MD  [facility]  [facility] [address] [place], [place] [address] Phone: [phone] FAX: [phone]   CC: [place], Wynelle Fanny, MD [place] [address] Phone: [phone] Fax: [phone]   Return to Endocrinology clinic as below: No future appointments.

## 2021-09-24 MED ORDER — METHIMAZOLE 5 MG PO TABS: 5.0000 mg | ORAL_TABLET | Freq: Every day | ORAL | 4 refills | Status: DC

## 2021-09-25 LAB — TRAB (TSH RECEPTOR BINDING ANTIBODY): TRAB: 9.23 IU/L — ABNORMAL HIGH (ref <=2.00)

## 2021-09-29 ENCOUNTER — Other Ambulatory Visit: Payer: Self-pay

## 2021-09-29 ENCOUNTER — Ambulatory Visit
Admission: RE | Admit: 2021-09-29 | Discharge: 2021-09-29 | Disposition: A | Discharge status: Home / Self Care | Payer: Medicare Other | Source: Ambulatory Visit | Attending: Internal Medicine | Admitting: Internal Medicine

## 2021-09-29 DIAGNOSIS — E0590 Thyrotoxicosis, unspecified without thyrotoxic crisis or storm: Secondary | ICD-10-CM | POA: Diagnosis not present

## 2021-10-07 ENCOUNTER — Encounter: Payer: Self-pay | Admitting: Internal Medicine

## 2021-10-07 ENCOUNTER — Telehealth: Payer: Self-pay | Admitting: Internal Medicine

## 2021-10-07 NOTE — Telephone Encounter (Signed)
Discussed elevated TRAb with the patient on 10/07/2021 at 1545 ? ? ?Patient has concerns about Graves' disease, we did discuss this is an autoimmune condition that affects the thyroid, we discussed that remission is expected within 1-1.5 years ? ?She has an appointment with her ophthalmologist in June and she was instructed to notify them of her new diagnosis ? ?We also discussed thyroid ultrasound showing questionable parathyroid adenoma but her calcium is normal and reassurance was provided ?

## 2021-11-04 ENCOUNTER — Other Ambulatory Visit (INDEPENDENT_AMBULATORY_CARE_PROVIDER_SITE_OTHER): Payer: Medicare Other

## 2021-11-04 DIAGNOSIS — E0590 Thyrotoxicosis, unspecified without thyrotoxic crisis or storm: Secondary | ICD-10-CM | POA: Diagnosis not present

## 2021-11-04 LAB — TSH: TSH: 0.01 u[IU]/mL — ABNORMAL LOW (ref 0.35–5.50)

## 2021-11-04 LAB — T4, FREE: Free T4: 0.96 ng/dL (ref 0.60–1.60)

## 2021-11-05 ENCOUNTER — Other Ambulatory Visit: Payer: Self-pay | Admitting: Internal Medicine

## 2021-11-05 MED ORDER — METHIMAZOLE 5 MG PO TABS: 10.0000 mg | ORAL_TABLET | Freq: Every day | ORAL | 4 refills | Status: DC

## 2021-11-08 DIAGNOSIS — H524 Presbyopia: Secondary | ICD-10-CM | POA: Diagnosis not present

## 2021-11-08 DIAGNOSIS — H2513 Age-related nuclear cataract, bilateral: Secondary | ICD-10-CM | POA: Diagnosis not present

## 2021-11-08 DIAGNOSIS — H52223 Regular astigmatism, bilateral: Secondary | ICD-10-CM | POA: Diagnosis not present

## 2021-11-08 DIAGNOSIS — H5203 Hypermetropia, bilateral: Secondary | ICD-10-CM | POA: Diagnosis not present

## 2021-12-24 ENCOUNTER — Encounter: Payer: Self-pay | Admitting: Internal Medicine

## 2021-12-24 ENCOUNTER — Ambulatory Visit: Payer: Medicare Other | Admitting: Internal Medicine

## 2021-12-24 VITALS — BP 122/68 | HR 68 | Ht 62.0 in | Wt 142.6 lb

## 2021-12-24 DIAGNOSIS — E0590 Thyrotoxicosis, unspecified without thyrotoxic crisis or storm: Secondary | ICD-10-CM

## 2021-12-24 DIAGNOSIS — E0500 Thyrotoxicosis with diffuse goiter without thyrotoxic crisis or storm: Secondary | ICD-10-CM | POA: Diagnosis not present

## 2021-12-24 DIAGNOSIS — E215 Disorder of parathyroid gland, unspecified: Secondary | ICD-10-CM | POA: Diagnosis not present

## 2021-12-24 LAB — COMPREHENSIVE METABOLIC PANEL
ALT: 16 U/L (ref 0–35)
AST: 22 U/L (ref 0–37)
Albumin: 3.9 g/dL (ref 3.5–5.2)
Alkaline Phosphatase: 149 U/L — ABNORMAL HIGH (ref 39–117)
BUN: 9 mg/dL (ref 6–23)
CO2: 28 mEq/L (ref 19–32)
Calcium: 9.7 mg/dL (ref 8.4–10.5)
Chloride: 104 mEq/L (ref 96–112)
Creatinine, Ser: 0.97 mg/dL (ref 0.40–1.20)
GFR: 57.16 mL/min — ABNORMAL LOW (ref >60.00)
Glucose, Bld: 93 mg/dL (ref 70–99)
Potassium: 5 mEq/L (ref 3.5–5.1)
Sodium: 139 mEq/L (ref 135–145)
Total Bilirubin: 0.4 mg/dL (ref 0.2–1.2)
Total Protein: 6.7 g/dL (ref 6.0–8.3)

## 2021-12-24 LAB — TSH: TSH: 3 u[IU]/mL (ref 0.35–5.50)

## 2021-12-24 LAB — VITAMIN D 25 HYDROXY (VIT D DEFICIENCY, FRACTURES): VITD: 53.5 ng/mL (ref 30.00–100.00)

## 2021-12-24 LAB — T4, FREE: Free T4: 0.69 ng/dL (ref 0.60–1.60)

## 2021-12-24 MED ORDER — METHIMAZOLE 5 MG PO TABS: 7.5000 mg | ORAL_TABLET | Freq: Every day | ORAL | 2 refills | Status: DC

## 2021-12-24 NOTE — Progress Notes (Signed)
Name: Diane Delacruz  MRN/ DOB: [id number], 06/29/1946    Age/ Sex: 76 y.o., female    PCP: Tower, Wynelle Fanny, MD   Reason for Endocrinology Evaluation: Hyperthyroidism     Date of Initial Endocrinology Evaluation: 09/23/2021    HPI: Diane Delacruz is a 76 y.o. female with a past medical history of Osteoporosis and Graves' disease. The patient presented for initial endocrinology clinic visit on 09/23/2021 for consultative assistance with her Hyperthyroidism .   Pt has been diagnosed with hyperthyroidism in 07/2021 with a suppressed TSH at 0.01 and elevated FT4 at 5.8 pg/mL . She was having weight loss at the time  TRAb elevated 9.23 IU/L Started methimazole 09/2021  Of note the pt has osteoporosis    Mother with thyroid disease ( hypothyroidism )    SUBJECTIVE:    Today (12/24/21):  Diane Delacruz is here for a follow up on hyperthyroidism management .  Weight stable  She has been to the ophthalmologist  Denies local neck swelling  Has palpitations  Denies loose stools , has hx of IBS hand tremors have been improving  After eating she has upper chest feeling of a stuck sensation , has heartburn  She has tried and leg weakness   Methimazole 5 mg , 2 tabs daily  She is on vitamin D 1000 iu daily       HISTORY:  Past Medical History:  Past Medical History:  Diagnosis Date   Allergic rhinitis due to pollen    Allergy    Anxiety    past hx   Asymptomatic varicose veins    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 Trenton Gammon   Depression    Diffuse cystic mastopathy    Diverticulosis    Dyspnea 2008   negative echo and MV scan   Esophageal reflux    History of gallstones    Hypoglycemia, unspecified    Irritable bowel syndrome    Lumbago    Palpitations 03/06/09   event monitor   Pure hypercholesterolemia    Stress fracture of foot 03/28/99   left   Symptomatic menopausal or female  climacteric states    Unspecified adverse effect of other drug, medicinal and biological substance(995.29)    Unspecified vitamin D deficiency    Past Surgical History:  Past Surgical History:  Procedure Laterality Date   BLADDER SURGERY     tumor removed   CHOLECYSTECTOMY     gallstones- ultrasound 10/03   COLONOSCOPY      Social History:  reports that she has never smoked. She has never used smokeless tobacco. She reports current alcohol use. She reports that she does not use drugs. Family History: family history includes Arthritis in her mother; Breast cancer in her paternal aunt; COPD in her sister; Colon cancer (age of onset: 6) in her father; Colon polyps in her maternal aunt; Diabetes in her brother and paternal uncle; Heart disease in her father; Hyperlipidemia in her mother; Irritable bowel syndrome in her mother and sister; Kidney disease in her cousin; Macular degeneration in her mother; Osteopenia in her sister; Other in her daughter; Stroke in her mother.   HOME MEDICATIONS: Allergies as of 12/24/2021       Reactions   Amoxicillin    Rash and diarrhea    Codeine    REACTION: unknown reaction   Flexeril [cyclobenzaprine]    sedation  Klonopin [clonazepam]    Dizziness , fatigue   Nsaids    REACTION: diarrhea   Omeprazole    REACTION: did not work for her   Pseudoephedrine    REACTION: jittery   Tessalon [benzonatate] Rash   Rash        Medication List        Accurate as of December 24, 2021  1:22 PM. If you have any questions, ask your nurse or doctor.          B-12 1000 MCG Tabs   CALCIUM 1200+D3 PO Take 1 tablet by mouth daily.   esomeprazole 40 MG capsule Commonly known as: NEXIUM Take 1 capsule (40 mg total) by mouth daily at 12 noon.   fexofenadine 60 MG tablet Commonly known as: ALLEGRA Take 180 mg by mouth daily as needed for allergies.   methimazole 5 MG tablet Commonly known as: TAPAZOLE Take 1.5 tablets (7.5 mg total) by mouth  daily. What changed: how much to take Changed by: Dorita Sciara, MD   mometasone 50 MCG/ACT nasal spray Commonly known as: NASONEX Place 2 sprays into the nose daily as needed (allergies).   pravastatin 20 MG tablet Commonly known as: PRAVACHOL Take 1 tablet (20 mg total) by mouth daily.   Qunol Ultra CoQ10 100-150 MG-UNIT Caps Generic drug: Coenzyme Q10-Vitamin E          REVIEW OF SYSTEMS: A comprehensive ROS was conducted with the patient and is negative except as per HPI    OBJECTIVE:  VS: BP 122/68 (BP Location: Left Arm, Patient Position: Sitting, Cuff Size: Normal)   Pulse 68   Ht '5\' 2"'$  (1.575 m)   Wt 142 lb 9.6 oz (64.7 kg)   LMP 07/19/1995   SpO2 97%   BMI 26.08 kg/m    Wt Readings from Last 3 Encounters:  12/24/21 142 lb 9.6 oz (64.7 kg)  09/23/21 142 lb (64.4 kg)  08/09/21 144 lb 2 oz (65.4 kg)     EXAM: General: Pt appears well and is in NAD  Eyes: External eye exam normal without stare, lid lag or exophthalmos.  EOM intact.    Neck: General: Supple without adenopathy. Thyroid: Thyroid size normal.  No goiter or nodules appreciated.   Lungs: Clear with good BS bilat with no rales, rhonchi, or wheezes  Heart: Auscultation: RRR.  Abdomen: Normoactive bowel sounds, soft, nontender, without masses or organomegaly palpable  Extremities:  BL LE: No pretibial edema normal ROM and strength.  Mental Status: Judgment, insight: Intact Orientation: Oriented to time, place, and person Mood and affect: No depression, anxiety, or agitation     DATA REVIEWED:    Latest Reference Range & Units 12/24/21 09:07  Sodium 135 - 145 mEq/L 139  Potassium 3.5 - 5.1 mEq/L 5.0  Chloride 96 - 112 mEq/L 104  CO2 19 - 32 mEq/L 28  Glucose 70 - 99 mg/dL 93  BUN 6 - 23 mg/dL 9  Creatinine 0.40 - 1.20 mg/dL 0.97  Calcium 8.4 - 10.5 mg/dL 9.7  Alkaline Phosphatase 39 - 117 U/L 149 (H)  Albumin 3.5 - 5.2 g/dL 3.9  AST 0 - 37 U/L 22  ALT 0 - 35 U/L 16  Total  Protein 6.0 - 8.3 g/dL 6.7  Total Bilirubin 0.2 - 1.2 mg/dL 0.4  GFR >60.00 mL/min 57.16 (L)    Latest Reference Range & Units 12/24/21 09:07  VITD 30.00 - 100.00 ng/mL 53.50  Glucose 70 - 99 mg/dL 93  TSH 0.35 -  5.50 uIU/mL 3.00  T4,Free(Direct) 0.60 - 1.60 ng/dL 0.69         Latest Reference Range & Units 09/23/21 11:59  TRAB <=2.00 IU/L 9.23 (H)     Thyroid ULtrasound 09/29/2021  Estimated total number of nodules >/= 1 cm: 2   Number of spongiform nodules >/=  2 cm not described below (TR1): 0   Number of mixed cystic and solid nodules >/= 1.5 cm not described below ([place]): 0   _________________________________________________________   Nodule # 1:   Location: Right; mid   Maximum size: 1.5 cm; Other 2 dimensions: 1.3 x 0.8 cm   Composition: mixed cystic and solid (1)   Echogenicity: isoechoic (1)   Shape: not taller-than-wide (0)   Margins: smooth (0)   Echogenic foci: none (0)   ACR TI-RADS total points: 2.   ACR TI-RADS risk category: TR2 (2 points).   ACR TI-RADS recommendations:   This nodule does NOT meet TI-RADS criteria for biopsy or dedicated follow-up.   _________________________________________________________   1.0 x 0.9 x 0.7 cm hypoechoic solid nodule located adjacent to the inferior tip of the right thyroid lobe is suspicious for a parathyroid adenoma.   IMPRESSION: 1.0 x 0.9 x 0.7 cm solid hypoechoic nodule appears adjacent to the inferior tip of the right thyroid lobe is suspicious for a parathyroid adenoma. Correlation with laboratory workup for hyperparathyroidism and nuclear medicine parathyroid scan would be beneficial. If these findings are negative for parathyroid lesion, a follow-up ultrasound of the thyroid should be performed in 1 year to again evaluate this nodule.   ASSESSMENT/PLAN/RECOMMENDATIONS:   Hyperthyroidism:  -Patient is clinically euthyroid -No local neck symptoms -TFTs today are normal, will reduce  methimazole as below  Medications : Decrease methimazole 5 mg , 1.5 tablets daily      2. Graves' Disease:  -No extrathyroidal manifestations of Graves' disease    3. 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  Elevated alkaline phosphatase:   -Could be increased due to increased bone resorption versus thionamide therapy  -We will continue to monitor   Follow-up in 4 months   Signed electronically by: Mack Guise, MD  [facility] Endocrin[facility]  [address] [place], [place] [address] Phone: [phone] FAX: [phone]   CC: Tower, Wynelle Fanny, MD [place] [address] Phone: [phone] Fax: [phone]   Return to Endocrinology clinic as below: Future Appointments  Date Time Provider [place]  03/02/2022  8:30 AM [facility] None  04/27/2022  9:10 AM [facility] [place], Melanie Crazier, MD [facility] None

## 2021-12-27 LAB — T3: T3, Total: 113 ng/dL (ref 76–181)

## 2021-12-27 LAB — PARATHYROID HORMONE, INTACT (NO CA): PTH: 34 pg/mL (ref 16–77)

## 2022-01-12 ENCOUNTER — Encounter: Payer: Self-pay | Admitting: Family Medicine

## 2022-01-12 ENCOUNTER — Ambulatory Visit (INDEPENDENT_AMBULATORY_CARE_PROVIDER_SITE_OTHER): Payer: Medicare Other | Admitting: Family Medicine

## 2022-01-12 VITALS — BP 104/60 | HR 67 | Temp 97.8°F | Ht 62.0 in | Wt 143.0 lb

## 2022-01-12 DIAGNOSIS — M549 Dorsalgia, unspecified: Secondary | ICD-10-CM | POA: Diagnosis not present

## 2022-01-12 MED ORDER — TIZANIDINE HCL 4 MG PO TABS: 2.0000 mg | ORAL_TABLET | Freq: Every evening | ORAL | 2 refills | Status: DC | PRN

## 2022-01-12 NOTE — Progress Notes (Signed)
Diane Kenley T. Ginny Loomer, MD, [place] [facility] [place], [address]  Phone: [phone]  FAX: [phone]  [name] - 76 y.o. female  MRN [id number]  Date of Birth: 1946-04-21  Date: 01/12/2022  PCP: Abner Greenspan, MD  Referral: Abner Greenspan, MD  Chief Complaint  Patient presents with   Back Pain    Started over the weekend   Subjective:   Diane Delacruz is a 76 y.o. very pleasant female patient who presents with the following: Back Pain  ongoing for approximately: 5 d The patient has had back pain before. The back pain is localized into the lumbar spine area. They also describe no radiculopathy.  R around L4-S1. No radicular pain No weakness, numbness About the same since Saturday.   Love seat has some heat an dmassage, and that seems to help some.   No numbness or tingling. No bowel or bladder incontinence. No focal weakness. Prior interventions: Tylenol, cannot take NSAIDS Physical therapy: No Chiropractic manipulations: No Acupuncture: No Osteopathic manipulation: No Heat or cold: Minimal effect   Review of Systems is noted in the HPI, as appropriate  Objective:   Blood pressure 104/60, pulse 67, temperature 97.8 F (36.6 C), temperature source Oral, height '5\' 2"'$  (1.575 m), weight 143 lb (64.9 kg), last menstrual period 07/19/1995, SpO2 95 %.  GEN: No acute distress; alert,appropriate. PULM: Breathing comfortably in no respiratory distress PSYCH: Normally interactive.   Range of motion at  the waist: Flexion, rotation and lateral bending: Forward flexion to 60 degrees, extension lateral bending and rotational movements are intact  No echymosis or edema Rises to examination table with no difficulty Gait: minimally antalgic  Inspection/Deformity: No abnormality Paraspinus T: Right-sided roughly L4-S1 with rigid musculature on this side  B Ankle Dorsiflexion (L5,4): 5/5 B Great  Toe Dorsiflexion (L5,4): 5/5 Heel Walk (L5): WNL Toe Walk (S1): WNL Rise/Squat (L4): WNL, mild pain  SENSORY B Medial Foot (L4): WNL B Dorsum (L5): WNL B Lateral (S1): WNL Light Touch: WNL  REFLEXES Knee (L4): 2+ Ankle (S1): 2+  B SLR, seated: neg B SLR, supine: neg B FABER: neg B Reverse FABER: neg B Greater Troch: NT B Log Roll: neg B Stork: NT B Sciatic Notch: NT  Radiology: No results found.  Assessment and Plan:     ICD-10-CM   1. Acute back pain, unspecified back location, unspecified back pain laterality  M54.9 tiZANidine (ZANAFLEX) 4 MG tablet      Anatomy reviewed. Conservative algorithms for acute back pain generally begin with the following: NSAIDS (not here, allergy), Muscle Relaxants, Mild pain medication Start with medications, core rehab, and progress from there following low back pain algorithm. No red flags are present.  Follow-up: No follow-ups on file.  Meds ordered this encounter  Medications   tiZANidine (ZANAFLEX) 4 MG tablet    Sig: Take 0.5-1 tablets (2-4 mg total) by mouth at bedtime as needed for muscle spasms.    Dispense:  30 tablet    Refill:  2   There are no discontinued medications. No orders of the defined types were placed in this encounter.   Signed,  Maud Deed. Britany Callicott, MD   Outpatient Encounter Medications as of 01/12/2022  Medication Sig   Calcium-Magnesium-Vitamin D (CALCIUM 1200+D3 PO) Take 1 tablet by mouth daily.   Coenzyme Q10-Vitamin E (QUNOL ULTRA COQ10) 100-150 MG-UNIT CAPS    Cyanocobalamin (B-12) 1000 MCG TABS  esomeprazole (NEXIUM) 40 MG capsule Take 1 capsule (40 mg total) by mouth daily at 12 noon.   fexofenadine (ALLEGRA) 60 MG tablet Take 180 mg by mouth daily as needed for allergies.   methimazole (TAPAZOLE) 5 MG tablet Take 1.5 tablets (7.5 mg total) by mouth daily.   mometasone (NASONEX) 50 MCG/ACT nasal spray Place 2 sprays into the nose daily as needed (allergies).   pravastatin (PRAVACHOL) 20  MG tablet Take 1 tablet (20 mg total) by mouth daily.   tiZANidine (ZANAFLEX) 4 MG tablet Take 0.5-1 tablets (2-4 mg total) by mouth at bedtime as needed for muscle spasms.   No facility-administered encounter medications on file as of 01/12/2022.

## 2022-02-04 DIAGNOSIS — H524 Presbyopia: Secondary | ICD-10-CM | POA: Diagnosis not present

## 2022-03-02 ENCOUNTER — Other Ambulatory Visit (INDEPENDENT_AMBULATORY_CARE_PROVIDER_SITE_OTHER): Payer: Medicare Other

## 2022-03-02 ENCOUNTER — Other Ambulatory Visit: Payer: Self-pay | Admitting: Internal Medicine

## 2022-03-02 DIAGNOSIS — E0590 Thyrotoxicosis, unspecified without thyrotoxic crisis or storm: Secondary | ICD-10-CM

## 2022-03-02 LAB — T4, FREE: Free T4: 0.88 ng/dL (ref 0.60–1.60)

## 2022-03-02 LAB — TSH: TSH: 3.88 u[IU]/mL (ref 0.35–5.50)

## 2022-04-11 ENCOUNTER — Telehealth: Payer: Self-pay

## 2022-04-11 ENCOUNTER — Encounter: Payer: Self-pay | Admitting: Family Medicine

## 2022-04-11 ENCOUNTER — Ambulatory Visit (INDEPENDENT_AMBULATORY_CARE_PROVIDER_SITE_OTHER): Payer: Medicare Other | Admitting: Family Medicine

## 2022-04-11 VITALS — BP 126/62 | HR 75 | Temp 97.3°F | Ht 62.0 in | Wt 144.1 lb

## 2022-04-11 DIAGNOSIS — R319 Hematuria, unspecified: Secondary | ICD-10-CM | POA: Diagnosis not present

## 2022-04-11 DIAGNOSIS — N3001 Acute cystitis with hematuria: Secondary | ICD-10-CM

## 2022-04-11 LAB — POCT UA - MICROSCOPIC ONLY

## 2022-04-11 LAB — POC URINALSYSI DIPSTICK (AUTOMATED)
Bilirubin, UA: NEGATIVE
Blood, UA: 25
Glucose, UA: NEGATIVE
Ketones, UA: NEGATIVE
Nitrite, UA: NEGATIVE
Protein, UA: NEGATIVE
Spec Grav, UA: 1.01 (ref 1.010–1.025)
Urobilinogen, UA: 0.2 E.U./dL
pH, UA: 6 (ref 5.0–8.0)

## 2022-04-11 MED ORDER — SULFAMETHOXAZOLE-TRIMETHOPRIM 800-160 MG PO TABS: 1.0000 | ORAL_TABLET | Freq: Two times a day (BID) | ORAL | 0 refills | Status: DC

## 2022-04-11 NOTE — Assessment & Plan Note (Signed)
Positive ua  Mild dysuria  Px bactrim (she has taken in the past) Culture sent  Handout given inst to call if symptoms worsen in the interim Will continue good fluid intake

## 2022-04-11 NOTE — Telephone Encounter (Signed)
Left detailed vm °

## 2022-04-11 NOTE — Telephone Encounter (Signed)
Patient states that she starting having blood in her urine yesterday and that her urine was dark in color. Patient states that she is not sure if this is related to her having Graves disease or if it's a UTI. Patient was advise that I will send message but also to reach out to pcp.

## 2022-04-11 NOTE — Patient Instructions (Addendum)
Drink lots of water   Take the bactrim ds as directed  If any side effects or problems let us know  If symptoms worsen let us know   We will call when your urine culture returns

## 2022-04-11 NOTE — Progress Notes (Signed)
Subjective:    Patient ID: Diane Delacruz, female    DOB: 1946/03/01, 76 y.o.   MRN: [id number]  HPI Pt presents with urinary change (dark/ blood)  Wt Readings from Last 3 Encounters:  04/11/22 144 lb 2 oz (65.4 kg)  01/12/22 143 lb (64.9 kg)  12/24/21 142 lb 9.6 oz (64.7 kg)   26.36 kg/m  ? If she has a uti   Has had some pain in r side -did not make much of it  Then some discomfort with urination (mild burning)   Urine was dark early this am  A little blood on the paper when she wiped later in the day   Now lighter and clearer  Started drinking lots of water   No fever  Was just a little nauseated in the past   Used to get utis a lot   Results for orders placed or performed in visit on 04/11/22  POCT Urinalysis Dipstick (Automated)  Result Value Ref Range   Color, UA Light Yellow    Clarity, UA Clear    Glucose, UA Negative Negative   Bilirubin, UA Negative    Ketones, UA Negaive    Spec Grav, UA 1.010 1.010 - 1.025   Blood, UA 25 Ery/uL    pH, UA 6.0 5.0 - 8.0   Protein, UA Negative Negative   Urobilinogen, UA 0.2 0.2 or 1.0 E.U./dL   Nitrite, UA Negative    Leukocytes, UA Small (1+) (A) Negative  POCT UA - Microscopic Only  Result Value Ref Range   WBC, Ur, HPF, POC 3-5 0 - 5   RBC, Urine, Miroscopic 0-3 0 - 2   Bacteria, U Microscopic mod None - Trace   Mucus, UA few    Epithelial cells, urine per micros few    Crystals, Ur, HPF, POC few    Casts, Ur, LPF, POC none    Yeast, UA none     Lab Results  Component Value Date   CREATININE 0.97 12/24/2021   BUN 9 12/24/2021   NA 139 12/24/2021   K 5.0 12/24/2021   CL 104 12/24/2021   CO2 28 12/24/2021   Patient Active Problem List   Diagnosis Date Noted   Graves disease 12/24/2021   Hyperthyroidism 12/24/2021   Current use of proton pump inhibitor 08/09/2021   Vitamin B12 deficiency 08/09/2021   Low TSH level 08/09/2021   Right foot pain 10/23/2019   Dizziness 03/20/2019   Acute cystitis  with hematuria 04/13/2018   Encounter for screening mammogram for breast cancer 06/21/2017   Elevated glucose level 06/21/2017   Estrogen deficiency 04/27/2015   Routine general medical examination at a health care facility 04/19/2015   Encounter for Medicare annual wellness exam 11/07/2012   TMJ (temporomandibular joint syndrome) 02/24/2012   Insomnia 02/21/2011   Family history of colon cancer 11/01/2010   Hyperlipidemia 11/01/2010   Vitamin D deficiency 10/10/2008   IRRITABLE BOWEL SYNDROME 10/15/2007   VARICOSE VEINS, LOWER EXTREMITIES 08/15/2007   Osteoporosis 05/14/2007   ALLERGIC RHINITIS, SEASONAL 05/11/2007   GERD 05/11/2007   FIBROCYSTIC BREAST DISEASE 05/11/2007   POSTMENOPAUSAL STATUS 05/11/2007   Past Medical History:  Diagnosis Date   Allergic rhinitis due to pollen    Allergy    Anxiety    past hx   Asymptomatic varicose veins    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 Trenton Gammon  Depression    Diffuse cystic mastopathy    Diverticulosis    Dyspnea 2008   negative echo and MV scan   Esophageal reflux    History of gallstones    Hypoglycemia, unspecified    Irritable bowel syndrome    Lumbago    Palpitations 03/06/09   event monitor   Pure hypercholesterolemia    Stress fracture of foot 03/28/99   left   Symptomatic menopausal or female climacteric states    Unspecified adverse effect of other drug, medicinal and biological substance(995.29)    Unspecified vitamin D deficiency    Past Surgical History:  Procedure Laterality Date   BLADDER SURGERY     tumor removed   CHOLECYSTECTOMY     gallstones- ultrasound 10/03   COLONOSCOPY     Social History   Tobacco Use   Smoking status: Never   Smokeless tobacco: Never  Vaping Use   Vaping Use: Never used  Substance Use Topics   Alcohol use: Yes    Alcohol/week: 0.0 standard drinks of alcohol    Comment: Rare-wine   Drug use:  No   Family History  Problem Relation Age of Onset   Hyperlipidemia Mother    Stroke Mother    Arthritis Mother    Macular degeneration Mother    Irritable bowel syndrome Mother    Colon cancer Father 38       died 64   Heart disease Father        MI's   COPD Sister        heavy smoker   Colon polyps Maternal Aunt    Breast cancer Paternal 88    Osteopenia Sister    Other Daughter        celiac spruce   Diabetes Brother    Diabetes Paternal Uncle    Irritable bowel syndrome Sister    Kidney disease Cousin    Rectal cancer Neg Hx    Stomach cancer Neg Hx    Esophageal cancer Neg Hx    Liver cancer Neg Hx    Pancreatic cancer Neg Hx    Prostate cancer Neg Hx    Allergies  Allergen Reactions   Amoxicillin     Rash and diarrhea    Codeine     REACTION: unknown reaction   Flexeril [Cyclobenzaprine]     sedation   Klonopin [Clonazepam]     Dizziness , fatigue   Nsaids     REACTION: diarrhea   Omeprazole     REACTION: did not work for her   Pseudoephedrine     REACTION: jittery   Tessalon [Benzonatate] Rash    Rash   Current Outpatient Medications on File Prior to Visit  Medication Sig Dispense Refill   Calcium-Magnesium-Vitamin D (CALCIUM 1200+D3 PO) Take 1 tablet by mouth daily.     Coenzyme Q10-Vitamin E (QUNOL ULTRA COQ10) 100-150 MG-UNIT CAPS      Cyanocobalamin (B-12) 1000 MCG TABS      esomeprazole (NEXIUM) 40 MG capsule Take 1 capsule (40 mg total) by mouth daily at 12 noon. 90 capsule 3   fexofenadine (ALLEGRA) 60 MG tablet Take 180 mg by mouth daily as needed for allergies.     methimazole (TAPAZOLE) 5 MG tablet Take 1.5 tablets (7.5 mg total) by mouth daily. 135 tablet 2   mometasone (NASONEX) 50 MCG/ACT nasal spray Place 2 sprays into the nose daily as needed (allergies). 17 g 11   pravastatin (PRAVACHOL) 20 MG tablet Take 1 tablet (20  mg total) by mouth daily. 90 tablet 3   No current facility-administered medications on file prior to visit.       Review of Systems  Constitutional:  Positive for fatigue. Negative for activity change, appetite change and fever.  HENT:  Negative for congestion and sore throat.   Eyes:  Negative for itching and visual disturbance.  Respiratory:  Negative for cough and shortness of breath.   Cardiovascular:  Negative for leg swelling.  Gastrointestinal:  Negative for abdominal distention, abdominal pain, constipation, diarrhea and nausea.  Endocrine: Negative for cold intolerance and polydipsia.  Genitourinary:  Positive for dysuria, frequency, hematuria and urgency. Negative for difficulty urinating and flank pain.  Musculoskeletal:  Negative for myalgias.  Skin:  Negative for rash.  Allergic/Immunologic: Negative for immunocompromised state.  Neurological:  Negative for dizziness and weakness.  Hematological:  Negative for adenopathy.       Objective:   Physical Exam Constitutional:      General: She is not in acute distress.    Appearance: Normal appearance. She is well-developed and normal weight. She is not ill-appearing or diaphoretic.  HENT:     Head: Normocephalic and atraumatic.  Eyes:     Conjunctiva/sclera: Conjunctivae normal.     Pupils: Pupils are equal, round, and reactive to light.  Cardiovascular:     Rate and Rhythm: Normal rate and regular rhythm.     Heart sounds: Normal heart sounds.  Pulmonary:     Effort: Pulmonary effort is normal.     Breath sounds: Normal breath sounds.  Abdominal:     General: Bowel sounds are normal. There is no distension.     Palpations: Abdomen is soft.     Tenderness: There is abdominal tenderness. There is no rebound.     Comments: No cva tenderness  No suprapubic tenderness or fullness    Musculoskeletal:     Cervical back: Normal range of motion and neck supple.  Lymphadenopathy:     Cervical: No cervical adenopathy.  Skin:    General: Skin is warm and dry.     Findings: No rash.  Neurological:     Mental Status: She is  alert.           Assessment & Plan:   Problem List Items Addressed This Visit       Genitourinary   Acute cystitis with hematuria - Primary    Positive ua  Mild dysuria  Px bactrim (she has taken in the past) Culture sent  Handout given inst to call if symptoms worsen in the interim Will continue good fluid intake       Relevant Orders   Urine Culture   POCT UA - Microscopic Only (Completed)   Other Visit Diagnoses     Hematuria, unspecified type       Relevant Orders   POCT Urinalysis Dipstick (Automated) (Completed)

## 2022-04-12 LAB — URINE CULTURE
MICRO NUMBER:: 13963272
Result:: NO GROWTH
SPECIMEN QUALITY:: ADEQUATE

## 2022-04-20 ENCOUNTER — Other Ambulatory Visit (INDEPENDENT_AMBULATORY_CARE_PROVIDER_SITE_OTHER): Payer: Medicare Other

## 2022-04-20 DIAGNOSIS — Z87448 Personal history of other diseases of urinary system: Secondary | ICD-10-CM

## 2022-04-20 LAB — POC URINALSYSI DIPSTICK (AUTOMATED)
Bilirubin, UA: NEGATIVE
Blood, UA: 25
Glucose, UA: NEGATIVE
Ketones, UA: NEGATIVE
Leukocytes, UA: NEGATIVE
Nitrite, UA: NEGATIVE
Protein, UA: NEGATIVE
Spec Grav, UA: 1.01 (ref 1.010–1.025)
Urobilinogen, UA: 0.2 E.U./dL
pH, UA: 6 (ref 5.0–8.0)

## 2022-04-27 ENCOUNTER — Ambulatory Visit: Payer: Medicare Other | Admitting: Internal Medicine

## 2022-04-27 ENCOUNTER — Encounter: Payer: Self-pay | Admitting: Internal Medicine

## 2022-04-27 VITALS — BP 120/76 | HR 67 | Ht 62.0 in | Wt 143.6 lb

## 2022-04-27 DIAGNOSIS — R29898 Other symptoms and signs involving the musculoskeletal system: Secondary | ICD-10-CM | POA: Diagnosis not present

## 2022-04-27 DIAGNOSIS — R748 Abnormal levels of other serum enzymes: Secondary | ICD-10-CM

## 2022-04-27 DIAGNOSIS — E0590 Thyrotoxicosis, unspecified without thyrotoxic crisis or storm: Secondary | ICD-10-CM

## 2022-04-27 DIAGNOSIS — E0500 Thyrotoxicosis with diffuse goiter without thyrotoxic crisis or storm: Secondary | ICD-10-CM

## 2022-04-27 LAB — COMPREHENSIVE METABOLIC PANEL
ALT: 12 U/L (ref 0–35)
AST: 20 U/L (ref 0–37)
Albumin: 4.1 g/dL (ref 3.5–5.2)
Alkaline Phosphatase: 102 U/L (ref 39–117)
BUN: 10 mg/dL (ref 6–23)
CO2: 29 mEq/L (ref 19–32)
Calcium: 9.6 mg/dL (ref 8.4–10.5)
Chloride: 104 mEq/L (ref 96–112)
Creatinine, Ser: 0.93 mg/dL (ref 0.40–1.20)
GFR: 59.98 mL/min — ABNORMAL LOW (ref >60.00)
Glucose, Bld: 99 mg/dL (ref 70–99)
Potassium: 5.2 mEq/L — ABNORMAL HIGH (ref 3.5–5.1)
Sodium: 138 mEq/L (ref 135–145)
Total Bilirubin: 0.5 mg/dL (ref 0.2–1.2)
Total Protein: 6.9 g/dL (ref 6.0–8.3)

## 2022-04-27 LAB — CBC WITH DIFFERENTIAL/PLATELET
Basophils Absolute: 0.1 10*3/uL (ref 0.0–0.1)
Basophils Relative: 1.4 % (ref 0.0–3.0)
Eosinophils Absolute: 0.3 10*3/uL (ref 0.0–0.7)
Eosinophils Relative: 7.2 % — ABNORMAL HIGH (ref 0.0–5.0)
HCT: 38.4 % (ref 36.0–46.0)
Hemoglobin: 12.8 g/dL (ref 12.0–15.0)
Lymphocytes Relative: 45.3 % (ref 12.0–46.0)
Lymphs Abs: 2.1 10*3/uL (ref 0.7–4.0)
MCHC: 33.3 g/dL (ref 30.0–36.0)
MCV: 94.1 fl (ref 78.0–100.0)
Monocytes Absolute: 0.3 10*3/uL (ref 0.1–1.0)
Monocytes Relative: 6.4 % (ref 3.0–12.0)
Neutro Abs: 1.8 10*3/uL (ref 1.4–7.7)
Neutrophils Relative %: 39.7 % — ABNORMAL LOW (ref 43.0–77.0)
Platelets: 305 10*3/uL (ref 150.0–400.0)
RBC: 4.07 Mil/uL (ref 3.87–5.11)
RDW: 12.4 % (ref 11.5–15.5)
WBC: 4.6 10*3/uL (ref 4.0–10.5)

## 2022-04-27 LAB — TSH: TSH: 9.38 u[IU]/mL — ABNORMAL HIGH (ref 0.35–5.50)

## 2022-04-27 LAB — VITAMIN D 25 HYDROXY (VIT D DEFICIENCY, FRACTURES): VITD: 74.07 ng/mL (ref 30.00–100.00)

## 2022-04-27 LAB — VITAMIN B12: Vitamin B-12: >1500 pg/mL — ABNORMAL HIGH (ref 211–911)

## 2022-04-27 LAB — T4, FREE: Free T4: 0.68 ng/dL (ref 0.60–1.60)

## 2022-04-27 MED ORDER — METHIMAZOLE 5 MG PO TABS: 5.0000 mg | ORAL_TABLET | ORAL | 2 refills | Status: DC

## 2022-04-27 MED ORDER — METHIMAZOLE 5 MG PO TABS: 7.5000 mg | ORAL_TABLET | Freq: Every day | ORAL | 2 refills | Status: DC

## 2022-04-27 NOTE — Progress Notes (Signed)
Name: Diane Delacruz  MRN/ DOB: [id number], [birth year]    Age/ Sex: 76 y.o., female    PCP: Tower, Wynelle Fanny, MD   Reason for Endocrinology Evaluation: Hyperthyroidism     Date of Initial Endocrinology Evaluation: 09/23/2021    HPI: Diane Delacruz is a 76 y.o. female with a past medical history of Osteoporosis and Graves' disease. The patient presented for initial endocrinology clinic visit on 09/23/2021 for consultative assistance with her Hyperthyroidism .   Pt has been diagnosed with hyperthyroidism in 07/2021 with a suppressed TSH at 0.01 and elevated FT4 at 5.8 pg/mL . She was having weight loss at the time  TRAb elevated 9.23 IU/L Started methimazole 09/2021  Of note the pt has osteoporosis    Mother with thyroid disease ( hypothyroidism )    SUBJECTIVE:    Today (04/27/22):  [name] is here for a follow up on hyperthyroidism management .  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 Trenton Gammon   Depression    Diffuse cystic mastopathy    Diverticulosis    Dyspnea 2008   negative echo and MV scan   Esophageal reflux    History of gallstones    Hypoglycemia, unspecified    Irritable bowel syndrome    Lumbago    Palpitations 03/06/09   event monitor   Pure hypercholesterolemia    Stress fracture of foot 03/28/99   left   Symptomatic menopausal or female climacteric states    Unspecified adverse effect of other drug, medicinal and biological  substance(995.29)    Unspecified vitamin D deficiency    Past Surgical History:  Past Surgical History:  Procedure Laterality Date   BLADDER SURGERY     tumor removed   CHOLECYSTECTOMY     gallstones- ultrasound 10/03   COLONOSCOPY      Social History:  reports that she has never smoked. She has never used smokeless tobacco. She reports current alcohol use. She reports that she does not use drugs. Family History: family history includes Arthritis in her mother; Breast cancer in her paternal aunt; COPD in her sister; Colon cancer (age of onset: 61) in her father; Colon polyps in her maternal aunt; Diabetes in her brother and paternal uncle; Heart disease in her father; Hyperlipidemia in her mother; Irritable bowel syndrome in her mother and sister; Kidney disease in her cousin; Macular degeneration in her mother; Osteopenia in her sister; Other in her daughter; Stroke in her mother.   HOME MEDICATIONS: Allergies as of 04/27/2022       Reactions   Amoxicillin    Rash and diarrhea    Codeine    REACTION: unknown reaction   Flexeril [cyclobenzaprine]    sedation   Klonopin [clonazepam]    Dizziness , fatigue   Nsaids    REACTION: diarrhea  Omeprazole    REACTION: did not work for her   Pseudoephedrine    REACTION: jittery   Tessalon [benzonatate] Rash   Rash        Medication List        Accurate as of April 27, 2022  7:29 AM. If you have any questions, ask your nurse or doctor.          B-12 1000 MCG Tabs   CALCIUM 1200+D3 PO Take 1 tablet by mouth daily.   esomeprazole 40 MG capsule Commonly known as: NEXIUM Take 1 capsule (40 mg total) by mouth daily at 12 noon.   fexofenadine 60 MG tablet Commonly known as: ALLEGRA Take 180 mg by mouth daily as needed for allergies.   methimazole 5 MG tablet Commonly known as: TAPAZOLE Take 1.5 tablets (7.5 mg total) by mouth daily.   mometasone 50 MCG/ACT nasal spray Commonly known as: NASONEX Place 2 sprays  into the nose daily as needed (allergies).   pravastatin 20 MG tablet Commonly known as: PRAVACHOL Take 1 tablet (20 mg total) by mouth daily.   Qunol Ultra CoQ10 100-150 MG-UNIT Caps Generic drug: Coenzyme Q10-Vitamin E   sulfamethoxazole-trimethoprim 800-160 MG tablet Commonly known as: BACTRIM DS Take 1 tablet by mouth 2 (two) times daily.          REVIEW OF SYSTEMS: A comprehensive ROS was conducted with the patient and is negative except as per HPI    OBJECTIVE:  VS: LMP 07/19/1995    Wt Readings from Last 3 Encounters:  04/11/22 144 lb 2 oz (65.4 kg)  01/12/22 143 lb (64.9 kg)  12/24/21 142 lb 9.6 oz (64.7 kg)     EXAM: General: Pt appears well and is in NAD  Eyes: External eye exam normal without stare, lid lag or exophthalmos.  EOM intact.    Neck: General: Supple without adenopathy. Thyroid: Thyroid size normal.  No goiter or nodules appreciated.   Lungs: Clear with good BS bilat with no rales, rhonchi, or wheezes  Heart: Auscultation: RRR.  Abdomen: Normoactive bowel sounds, soft, nontender, without masses or organomegaly palpable  Extremities:  BL LE: No pretibial edema normal ROM and strength.  Mental Status: Judgment, insight: Intact Orientation: Oriented to time, place, and person Mood and affect: No depression, anxiety, or agitation     DATA REVIEWED:    Latest Reference Range & Units 12/24/21 09:07  Sodium 135 - 145 mEq/L 139  Potassium 3.5 - 5.1 mEq/L 5.0  Chloride 96 - 112 mEq/L 104  CO2 19 - 32 mEq/L 28  Glucose 70 - 99 mg/dL 93  BUN 6 - 23 mg/dL 9  Creatinine 0.40 - 1.20 mg/dL 0.97  Calcium 8.4 - 10.5 mg/dL 9.7  Alkaline Phosphatase 39 - 117 U/L 149 (H)  Albumin 3.5 - 5.2 g/dL 3.9  AST 0 - 37 U/L 22  ALT 0 - 35 U/L 16  Total Protein 6.0 - 8.3 g/dL 6.7  Total Bilirubin 0.2 - 1.2 mg/dL 0.4  GFR >60.00 mL/min 57.16 (L)    Latest Reference Range & Units 12/24/21 09:07  VITD 30.00 - 100.00 ng/mL 53.50  Glucose 70 - 99 mg/dL 93  TSH  0.35 - 5.50 uIU/mL 3.00  T4,Free(Direct) 0.60 - 1.60 ng/dL 0.69         Latest Reference Range & Units 09/23/21 11:59  TRAB <=2.00 IU/L 9.23 (H)     Thyroid ULtrasound 09/29/2021  Estimated total number of nodules >/= 1 cm: 2   Number of  spongiform nodules >/=  2 cm not described below (TR1): 0   Number of mixed cystic and solid nodules >/= 1.5 cm not described below (Allen): 0   _________________________________________________________   Nodule # 1:   Location: Right; mid   Maximum size: 1.5 cm; Other 2 dimensions: 1.3 x 0.8 cm   Composition: mixed cystic and solid (1)   Echogenicity: isoechoic (1)   Shape: not taller-than-wide (0)   Margins: smooth (0)   Echogenic foci: none (0)   ACR TI-RADS total points: 2.   ACR TI-RADS risk category: TR2 (2 points).   ACR TI-RADS recommendations:   This nodule does NOT meet TI-RADS criteria for biopsy or dedicated follow-up.   _________________________________________________________   1.0 x 0.9 x 0.7 cm hypoechoic solid nodule located adjacent to the inferior tip of the right thyroid lobe is suspicious for a parathyroid adenoma.   IMPRESSION: 1.0 x 0.9 x 0.7 cm solid hypoechoic nodule appears adjacent to the inferior tip of the right thyroid lobe is suspicious for a parathyroid adenoma. Correlation with laboratory workup for hyperparathyroidism and nuclear medicine parathyroid scan would be beneficial. If these findings are negative for parathyroid lesion, a follow-up ultrasound of the thyroid should be performed in 1 year to again evaluate this nodule.   ASSESSMENT/PLAN/RECOMMENDATIONS:   Hyperthyroidism:  -Patient is clinically euthyroid -No local neck symptoms -TFTs today show a elevated TSH, will reduce methimazole as below   Medications : Decrease methimazole 5 mg , 1 tablet Monday through Saturday (skip Sundays)     2. 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  Elevated alkaline phosphatase:   -Could be increased due to increased bone resorption versus thionamide therapy  -Resolved on today's labs  5. Hyperkalemia :  -This is minimal, she is not on any potassium supplement -Patient has been eating more tomatoes than usual during the summer as well as bananas -I have encouraged hydration as well as monitoring the amount of tomatoes and bananas as they are high in potassium  Follow-up in 6 months Labs in 3 months     Addendum: I have personally discussed the lab results with the patient on 04/27/2022 at 1430  Signed electronically by: Mack Guise, MD  [facility]  [facility] [address] [place], [place] [address] Phone: [phone] FAX: [phone]   CC: Tower, Wynelle Fanny, MD [place] [address] Phone: [phone] Fax: [phone]   Return to Endocrinology clinic as below: Future Appointments  Date Time Provider [place]  04/27/2022  9:10 AM Jashayla Glatfelter, Melanie Crazier, MD [facility] None

## 2022-07-28 ENCOUNTER — Other Ambulatory Visit (INDEPENDENT_AMBULATORY_CARE_PROVIDER_SITE_OTHER): Payer: Medicare Other

## 2022-07-28 DIAGNOSIS — E0590 Thyrotoxicosis, unspecified without thyrotoxic crisis or storm: Secondary | ICD-10-CM | POA: Diagnosis not present

## 2022-07-28 LAB — T4, FREE: Free T4: 0.7 ng/dL (ref 0.60–1.60)

## 2022-07-28 LAB — TSH: TSH: 3.89 u[IU]/mL (ref 0.35–5.50)

## 2022-08-04 ENCOUNTER — Telehealth: Payer: Self-pay | Admitting: Family Medicine

## 2022-08-04 DIAGNOSIS — R7309 Other abnormal glucose: Secondary | ICD-10-CM

## 2022-08-04 DIAGNOSIS — E538 Deficiency of other specified B group vitamins: Secondary | ICD-10-CM

## 2022-08-04 DIAGNOSIS — E7800 Pure hypercholesterolemia, unspecified: Secondary | ICD-10-CM

## 2022-08-04 DIAGNOSIS — E559 Vitamin D deficiency, unspecified: Secondary | ICD-10-CM

## 2022-08-04 DIAGNOSIS — Z79899 Other long term (current) drug therapy: Secondary | ICD-10-CM

## 2022-08-04 DIAGNOSIS — M810 Age-related osteoporosis without current pathological fracture: Secondary | ICD-10-CM

## 2022-08-04 DIAGNOSIS — E0590 Thyrotoxicosis, unspecified without thyrotoxic crisis or storm: Secondary | ICD-10-CM

## 2022-08-04 DIAGNOSIS — E0500 Thyrotoxicosis with diffuse goiter without thyrotoxic crisis or storm: Secondary | ICD-10-CM

## 2022-08-04 NOTE — Telephone Encounter (Signed)
-----  Message from Velna Hatchet, RT sent at 07/20/2022  9:11 AM EST ----- Regarding: Fri 1/19 lab Patient is scheduled for cpx, please order future labs.  Thanks, Anda Kraft

## 2022-08-05 ENCOUNTER — Other Ambulatory Visit (INDEPENDENT_AMBULATORY_CARE_PROVIDER_SITE_OTHER): Payer: Medicare Other

## 2022-08-05 DIAGNOSIS — Z79899 Other long term (current) drug therapy: Secondary | ICD-10-CM

## 2022-08-05 DIAGNOSIS — E538 Deficiency of other specified B group vitamins: Secondary | ICD-10-CM

## 2022-08-05 DIAGNOSIS — E0590 Thyrotoxicosis, unspecified without thyrotoxic crisis or storm: Secondary | ICD-10-CM | POA: Diagnosis not present

## 2022-08-05 DIAGNOSIS — R7309 Other abnormal glucose: Secondary | ICD-10-CM | POA: Diagnosis not present

## 2022-08-05 DIAGNOSIS — E0500 Thyrotoxicosis with diffuse goiter without thyrotoxic crisis or storm: Secondary | ICD-10-CM

## 2022-08-05 DIAGNOSIS — E559 Vitamin D deficiency, unspecified: Secondary | ICD-10-CM | POA: Diagnosis not present

## 2022-08-05 DIAGNOSIS — E7800 Pure hypercholesterolemia, unspecified: Secondary | ICD-10-CM

## 2022-08-05 LAB — COMPREHENSIVE METABOLIC PANEL
ALT: 11 U/L (ref 0–35)
AST: 17 U/L (ref 0–37)
Albumin: 4 g/dL (ref 3.5–5.2)
Alkaline Phosphatase: 94 U/L (ref 39–117)
BUN: 10 mg/dL (ref 6–23)
CO2: 28 mEq/L (ref 19–32)
Calcium: 9.6 mg/dL (ref 8.4–10.5)
Chloride: 105 mEq/L (ref 96–112)
Creatinine, Ser: 0.99 mg/dL (ref 0.40–1.20)
GFR: 55.53 mL/min — ABNORMAL LOW (ref >60.00)
Glucose, Bld: 99 mg/dL (ref 70–99)
Potassium: 4.7 mEq/L (ref 3.5–5.1)
Sodium: 141 mEq/L (ref 135–145)
Total Bilirubin: 0.5 mg/dL (ref 0.2–1.2)
Total Protein: 6.2 g/dL (ref 6.0–8.3)

## 2022-08-05 LAB — CBC WITH DIFFERENTIAL/PLATELET
Basophils Absolute: 0 10*3/uL (ref 0.0–0.1)
Basophils Relative: 0.8 % (ref 0.0–3.0)
Eosinophils Absolute: 0.4 10*3/uL (ref 0.0–0.7)
Eosinophils Relative: 7.8 % — ABNORMAL HIGH (ref 0.0–5.0)
HCT: 39.2 % (ref 36.0–46.0)
Hemoglobin: 13.3 g/dL (ref 12.0–15.0)
Lymphocytes Relative: 50.1 % — ABNORMAL HIGH (ref 12.0–46.0)
Lymphs Abs: 2.5 10*3/uL (ref 0.7–4.0)
MCHC: 33.9 g/dL (ref 30.0–36.0)
MCV: 94.3 fl (ref 78.0–100.0)
Monocytes Absolute: 0.3 10*3/uL (ref 0.1–1.0)
Monocytes Relative: 6.7 % (ref 3.0–12.0)
Neutro Abs: 1.7 10*3/uL (ref 1.4–7.7)
Neutrophils Relative %: 34.6 % — ABNORMAL LOW (ref 43.0–77.0)
Platelets: 315 10*3/uL (ref 150.0–400.0)
RBC: 4.16 Mil/uL (ref 3.87–5.11)
RDW: 12.4 % (ref 11.5–15.5)
WBC: 4.9 10*3/uL (ref 4.0–10.5)

## 2022-08-05 LAB — LIPID PANEL
Cholesterol: 215 mg/dL — ABNORMAL HIGH (ref 0–200)
HDL: 77.5 mg/dL (ref >39.00)
LDL Cholesterol: 120 mg/dL — ABNORMAL HIGH (ref 0–99)
NonHDL: 137.05
Total CHOL/HDL Ratio: 3
Triglycerides: 83 mg/dL (ref 0.0–149.0)
VLDL: 16.6 mg/dL (ref 0.0–40.0)

## 2022-08-05 LAB — HEMOGLOBIN A1C: Hgb A1c MFr Bld: 5.8 % (ref 4.6–6.5)

## 2022-08-05 LAB — VITAMIN D 25 HYDROXY (VIT D DEFICIENCY, FRACTURES): VITD: 41.95 ng/mL (ref 30.00–100.00)

## 2022-08-05 LAB — VITAMIN B12: Vitamin B-12: 453 pg/mL (ref 211–911)

## 2022-08-05 LAB — TSH: TSH: 5.06 u[IU]/mL (ref 0.35–5.50)

## 2022-08-11 ENCOUNTER — Encounter: Payer: Self-pay | Admitting: Family Medicine

## 2022-08-11 ENCOUNTER — Ambulatory Visit (INDEPENDENT_AMBULATORY_CARE_PROVIDER_SITE_OTHER): Payer: Medicare Other | Admitting: Family Medicine

## 2022-08-11 ENCOUNTER — Ambulatory Visit (INDEPENDENT_AMBULATORY_CARE_PROVIDER_SITE_OTHER): Payer: Medicare Other

## 2022-08-11 VITALS — BP 124/68 | HR 83 | Temp 97.7°F | Ht 61.5 in | Wt 145.5 lb

## 2022-08-11 VITALS — Ht 62.0 in | Wt 143.0 lb

## 2022-08-11 DIAGNOSIS — R7309 Other abnormal glucose: Secondary | ICD-10-CM

## 2022-08-11 DIAGNOSIS — Z0000 Encounter for general adult medical examination without abnormal findings: Secondary | ICD-10-CM

## 2022-08-11 DIAGNOSIS — Z780 Asymptomatic menopausal state: Secondary | ICD-10-CM | POA: Diagnosis not present

## 2022-08-11 DIAGNOSIS — Z800 Family history of malignant neoplasm of digestive organs: Secondary | ICD-10-CM

## 2022-08-11 DIAGNOSIS — E2839 Other primary ovarian failure: Secondary | ICD-10-CM

## 2022-08-11 DIAGNOSIS — Z1231 Encounter for screening mammogram for malignant neoplasm of breast: Secondary | ICD-10-CM

## 2022-08-11 DIAGNOSIS — K219 Gastro-esophageal reflux disease without esophagitis: Secondary | ICD-10-CM

## 2022-08-11 DIAGNOSIS — M810 Age-related osteoporosis without current pathological fracture: Secondary | ICD-10-CM

## 2022-08-11 DIAGNOSIS — Z79899 Other long term (current) drug therapy: Secondary | ICD-10-CM

## 2022-08-11 DIAGNOSIS — Z860101 Personal history of adenomatous and serrated colon polyps: Secondary | ICD-10-CM

## 2022-08-11 DIAGNOSIS — E0590 Thyrotoxicosis, unspecified without thyrotoxic crisis or storm: Secondary | ICD-10-CM

## 2022-08-11 DIAGNOSIS — E559 Vitamin D deficiency, unspecified: Secondary | ICD-10-CM

## 2022-08-11 DIAGNOSIS — Z86010 Personal history of colonic polyps: Secondary | ICD-10-CM

## 2022-08-11 DIAGNOSIS — E538 Deficiency of other specified B group vitamins: Secondary | ICD-10-CM

## 2022-08-11 DIAGNOSIS — E7800 Pure hypercholesterolemia, unspecified: Secondary | ICD-10-CM

## 2022-08-11 MED ORDER — PRAVASTATIN SODIUM 20 MG PO TABS: 20.0000 mg | ORAL_TABLET | ORAL | 3 refills | Status: DC

## 2022-08-11 MED ORDER — ESOMEPRAZOLE MAGNESIUM 40 MG PO CPDR: 40.0000 mg | DELAYED_RELEASE_CAPSULE | Freq: Every day | ORAL | 3 refills | Status: DC

## 2022-08-11 NOTE — Assessment & Plan Note (Signed)
Due for 5 y recall colonosocpy 09/2022 Referral done Pt will call to schedule

## 2022-08-11 NOTE — Assessment & Plan Note (Signed)
Continues tapazole for Graves' dz from endocrinology

## 2022-08-11 NOTE — Assessment & Plan Note (Signed)
Reviewed health habits including diet and exercise and skin cancer prevention Reviewed appropriate screening tests for age  Also reviewed health mt list, fam hx and immunization status , as well as social and family history   See HPI Labs rev  Disc shingrix vaccine -not ready for it Flu shot declined  Mammogram ordered-pt will schedule  GI ref for 5 year recall colonoscopy  Dexa due-ordered and pt to schedule  No falls or fx/disc fall prev PHQ of 0 Enc some strength training exercise

## 2022-08-11 NOTE — Patient Instructions (Signed)
Ms. Behl , Thank you for taking time to come for your Medicare Wellness Visit. I appreciate your ongoing commitment to your health goals. Please review the following plan we discussed and let me know if I can assist you in the future.   These are the goals we discussed:  Goals      DIET - EAT MORE FRUITS AND VEGETABLES     DIET - INCREASE WATER INTAKE     Starting 06/18/2018, I will attempt to drink 6-8 glasses of water daily.      Patient Stated     06/20/2019, I will maintain and continue medications as prescribed.         This is a list of the screening recommended for you and due dates:  Health Maintenance  Topic Date Due   Zoster (Shingles) Vaccine (1 of 2) Never done   COVID-19 Vaccine (2 - Pfizer risk series) 11/23/2019   Mammogram  12/25/2020   Flu Shot  02/15/2022   Colon Cancer Screening  10/06/2022   Medicare Annual Wellness Visit  08/12/2023   DTaP/Tdap/Td vaccine (3 - Tdap) 02/11/2029   Pneumonia Vaccine  Completed   DEXA scan (bone density measurement)  Completed   Hepatitis C Screening: USPSTF Recommendation to screen - Ages 12-79 yo.  Completed   HPV Vaccine  Aged Out    Advanced directives: no  Conditions/risks identified: none  Next appointment: Follow up in one year for your annual wellness visit 08/14/23 @ 11:45 am by phone   Preventive Care 65 Years and Older, Female Preventive care refers to lifestyle choices and visits with your health care provider that can promote health and wellness. What does preventive care include? A yearly physical exam. This is also called an annual well check. Dental exams once or twice a year. Routine eye exams. Ask your health care provider how often you should have your eyes checked. Personal lifestyle choices, including: Daily care of your teeth and gums. Regular physical activity. Eating a healthy diet. Avoiding tobacco and drug use. Limiting alcohol use. Practicing safe sex. Taking low-dose aspirin every  day. Taking vitamin and mineral supplements as recommended by your health care provider. What happens during an annual well check? The services and screenings done by your health care provider during your annual well check will depend on your age, overall health, lifestyle risk factors, and family history of disease. Counseling  Your health care provider may ask you questions about your: Alcohol use. Tobacco use. Drug use. Emotional well-being. Home and relationship well-being. Sexual activity. Eating habits. History of falls. Memory and ability to understand (cognition). Work and work Statistician. Reproductive health. Screening  You may have the following tests or measurements: Height, weight, and BMI. Blood pressure. Lipid and cholesterol levels. These may be checked every 5 years, or more frequently if you are over 67 years old. Skin check. Lung cancer screening. You may have this screening every year starting at age 76 if you have a 30-pack-year history of smoking and currently smoke or have quit within the past 15 years. Fecal occult blood test (FOBT) of the stool. You may have this test every year starting at age 58. Flexible sigmoidoscopy or colonoscopy. You may have a sigmoidoscopy every 5 years or a colonoscopy every 10 years starting at age 51. Hepatitis C blood test. Hepatitis B blood test. Sexually transmitted disease (STD) testing. Diabetes screening. This is done by checking your blood sugar (glucose) after you have not eaten for a while (fasting). You  may have this done every 1-3 years. Bone density scan. This is done to screen for osteoporosis. You may have this done starting at age 6. Mammogram. This may be done every 1-2 years. Talk to your health care provider about how often you should have regular mammograms. Talk with your health care provider about your test results, treatment options, and if necessary, the need for more tests. Vaccines  Your health care  provider may recommend certain vaccines, such as: Influenza vaccine. This is recommended every year. Tetanus, diphtheria, and acellular pertussis (Tdap, Td) vaccine. You may need a Td booster every 10 years. Zoster vaccine. You may need this after age 23. Pneumococcal 13-valent conjugate (PCV13) vaccine. One dose is recommended after age 41. Pneumococcal polysaccharide (PPSV23) vaccine. One dose is recommended after age 51. Talk to your health care provider about which screenings and vaccines you need and how often you need them. This information is not intended to replace advice given to you by your health care provider. Make sure you discuss any questions you have with your health care provider. Document Released: 07/31/2015 Document Revised: 03/23/2016 Document Reviewed: 05/05/2015 Elsevier Interactive Patient Education  2017 [place] Prevention in the Home Falls can cause injuries. They can happen to people of all ages. There are many things you can do to make your home safe and to help prevent falls. What can I do on the outside of my home? Regularly fix the edges of walkways and driveways and fix any cracks. Remove anything that might make you trip as you walk through a door, such as a raised step or threshold. Trim any bushes or trees on the path to your home. Use bright outdoor lighting. Clear any walking paths of anything that might make someone trip, such as rocks or tools. Regularly check to see if handrails are loose or broken. Make sure that both sides of any steps have handrails. Any raised decks and porches should have guardrails on the edges. Have any leaves, snow, or ice cleared regularly. Use sand or salt on walking paths during winter. Clean up any spills in your garage right away. This includes oil or grease spills. What can I do in the bathroom? Use night lights. Install grab bars by the toilet and in the tub and shower. Do not use towel bars as grab  bars. Use non-skid mats or decals in the tub or shower. If you need to sit down in the shower, use a plastic, non-slip stool. Keep the floor dry. Clean up any water that spills on the floor as soon as it happens. Remove soap buildup in the tub or shower regularly. Attach bath mats securely with double-sided non-slip rug tape. Do not have throw rugs and other things on the floor that can make you trip. What can I do in the bedroom? Use night lights. Make sure that you have a light by your bed that is easy to reach. Do not use any sheets or blankets that are too big for your bed. They should not hang down onto the floor. Have a firm chair that has side arms. You can use this for support while you get dressed. Do not have throw rugs and other things on the floor that can make you trip. What can I do in the kitchen? Clean up any spills right away. Avoid walking on wet floors. Keep items that you use a lot in easy-to-reach places. If you need to reach something above you, use a strong  step stool that has a grab bar. Keep electrical cords out of the way. Do not use floor polish or wax that makes floors slippery. If you must use wax, use non-skid floor wax. Do not have throw rugs and other things on the floor that can make you trip. What can I do with my stairs? Do not leave any items on the stairs. Make sure that there are handrails on both sides of the stairs and use them. Fix handrails that are broken or loose. Make sure that handrails are as long as the stairways. Check any carpeting to make sure that it is firmly attached to the stairs. Fix any carpet that is loose or worn. Avoid having throw rugs at the top or bottom of the stairs. If you do have throw rugs, attach them to the floor with carpet tape. Make sure that you have a light switch at the top of the stairs and the bottom of the stairs. If you do not have them, ask someone to add them for you. What else can I do to help prevent  falls? Wear shoes that: Do not have high heels. Have rubber bottoms. Are comfortable and fit you well. Are closed at the toe. Do not wear sandals. If you use a stepladder: Make sure that it is fully opened. Do not climb a closed stepladder. Make sure that both sides of the stepladder are locked into place. Ask someone to hold it for you, if possible. Clearly mark and make sure that you can see: Any grab bars or handrails. First and last steps. Where the edge of each step is. Use tools that help you move around (mobility aids) if they are needed. These include: Canes. Walkers. Scooters. Crutches. Turn on the lights when you go into a dark area. Replace any light bulbs as soon as they burn out. Set up your furniture so you have a clear path. Avoid moving your furniture around. If any of your floors are uneven, fix them. If there are any pets around you, be aware of where they are. Review your medicines with your doctor. Some medicines can make you feel dizzy. This can increase your chance of falling. Ask your doctor what other things that you can do to help prevent falls. This information is not intended to replace advice given to you by your health care provider. Make sure you discuss any questions you have with your health care provider. Document Released: 04/30/2009 Document Revised: 12/10/2015 Document Reviewed: 08/08/2014 Elsevier Interactive Patient Education  2017 [employer].

## 2022-08-11 NOTE — Assessment & Plan Note (Signed)
Watching Renal status Bone health  B12 and D

## 2022-08-11 NOTE — Patient Instructions (Addendum)
If you are interested in the new shingles vaccine (Shingrix) - call your local pharmacy to check on coverage and availability  If affordable, get on a wait list at your pharmacy to get the vaccine.  If you change your mind about a flu shot you can get it anywhere     If you can - investigate options for strength training  Bands, weights, classes, videos , machines at the gyn   Call the breast center and schedule your mammogram and DEXA  I want to consider alendronate for bones Look at the handout and tell me if you want to try  Call to schedule your 5 year colonoscopy with [facility]    [facility]  [phone]     Watch diet for added sugar and cholesterol  Avoid red meat/ fried foods/ egg yolks/ fatty breakfast meats/ butter, cheese and high fat dairy/ and shellfish

## 2022-08-11 NOTE — Assessment & Plan Note (Signed)
Lab Results  Component Value Date   HGBA1C 5.8 08/05/2022   disc imp of low glycemic diet and wt loss to prevent DM2

## 2022-08-11 NOTE — Assessment & Plan Note (Signed)
Disc goals for lipids and reasons to control them Rev last labs with pt Rev low sat fat diet in detail Taking pravastatin every other day now due to side eff Ratio stable but LDL up  HDL up also   Will continue to follow  Diet will improve as well

## 2022-08-11 NOTE — Assessment & Plan Note (Signed)
Unable to wean nexium after trying several times Conitnue this 40 mg daily   Enc good fluids Watch diet   Follow dexa, renal status and vit levels

## 2022-08-11 NOTE — Assessment & Plan Note (Signed)
Due for colonoscopy 09/2022

## 2022-08-11 NOTE — Progress Notes (Signed)
Virtual Visit via Telephone Note  I connected with  Diane Delacruz on 08/11/22 at  9:30 AM EST by telephone and verified that I am speaking with the correct person using two identifiers.  Location: Patient: home Provider: [place] Persons participating in the virtual visit: [place]   I discussed the limitations, risks, security and privacy concerns of performing an evaluation and management service by telephone and the availability of in person appointments. The patient expressed understanding and agreed to proceed.  Interactive audio and video telecommunications were attempted between this nurse and patient, however failed, due to patient having technical difficulties OR patient did not have access to video capability.  We continued and completed visit with audio only.  Some vital signs may be absent or patient reported.   [name], LPN  Subjective:   Diane Delacruz is a 77 y.o. female who presents for Medicare Annual (Subsequent) preventive examination.  Review of Systems     Cardiac Risk Factors include: advanced age (>56mn, >>56women);dyslipidemia     Objective:    There were no vitals filed for this visit. There is no height or weight on file to calculate BMI.     08/11/2022    9:34 AM 06/20/2019   10:30 AM 06/18/2018   11:00 AM 06/12/2017   11:23 AM 05/31/2016    1:23 PM  Advanced Directives  Does Patient Have a Medical Advance Directive? No No Yes No Yes  Type of AComptrollerLiving will  [place]  Does patient want to make changes to medical advance directive?     No - Patient declined  Copy of [place] Chart?     No - copy requested  Would patient like information on creating a medical advance directive? No - Patient declined Yes (MAU/Ambulatory/Procedural Areas - Information given)  No - Patient declined     Current Medications  (verified) Outpatient Encounter Medications as of 08/11/2022  Medication Sig   Calcium-Magnesium-Vitamin D (CALCIUM 1200+D3 PO) Take 1 tablet by mouth daily.   Coenzyme Q10-Vitamin E (QUNOL ULTRA COQ10) 100-150 MG-UNIT CAPS    esomeprazole (NEXIUM) 40 MG capsule Take 1 capsule (40 mg total) by mouth daily at 12 noon.   fexofenadine (ALLEGRA) 60 MG tablet Take 180 mg by mouth daily as needed for allergies.   methimazole (TAPAZOLE) 5 MG tablet Take 1 tablet (5 mg total) by mouth as directed. 1 tablet Monday through Saturday (skips Sundays)   mometasone (NASONEX) 50 MCG/ACT nasal spray Place 2 sprays into the nose daily as needed (allergies).   pravastatin (PRAVACHOL) 20 MG tablet Take 1 tablet (20 mg total) by mouth daily.   Cyanocobalamin (B-12) 1000 MCG TABS  (Patient not taking: Reported on 08/11/2022)   No facility-administered encounter medications on file as of 08/11/2022.    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 PTrenton Gammon  Depression    Diffuse cystic mastopathy    Diverticulosis    Dyspnea 2008   negative echo and MV scan   Esophageal  reflux    History of gallstones    Hypoglycemia, unspecified    Irritable bowel syndrome    Lumbago    Palpitations 03/06/09   event monitor   Pure hypercholesterolemia    Stress fracture of foot 03/28/99   left   Symptomatic menopausal or female climacteric states    Unspecified adverse effect of other drug, medicinal and biological substance(995.29)    Unspecified vitamin D deficiency    Past Surgical History:  Procedure Laterality Date   BLADDER SURGERY      tumor removed   CHOLECYSTECTOMY     gallstones- ultrasound 10/03   COLONOSCOPY     Family History  Problem Relation Age of Onset   Hyperlipidemia Mother    Stroke Mother    Arthritis Mother    Macular degeneration Mother    Irritable bowel syndrome Mother    Colon cancer Father 31       died 85   Heart disease Father        MI's   COPD Sister        heavy smoker   Colon polyps Maternal Aunt    Breast cancer Paternal 71    Osteopenia Sister    Other Daughter        celiac spruce   Diabetes Brother    Diabetes Paternal Uncle    Irritable bowel syndrome Sister    Kidney disease Cousin    Rectal cancer Neg Hx    Stomach cancer Neg Hx    Esophageal cancer Neg Hx    Liver cancer Neg Hx    Pancreatic cancer Neg Hx    Prostate cancer Neg Hx    Social History   Socioeconomic History   Marital status: Married    Spouse name: Not on file   Number of children: 1   Years of education: Not on file   Highest education level: Not on file  Occupational History   Occupation: retired    Fish farm manager: unemployed  Tobacco Use   Smoking status: Never   Smokeless tobacco: Never  Vaping Use   Vaping Use: Never used  Substance and Sexual Activity   Alcohol use: Yes    Alcohol/week: 0.0 standard drinks of alcohol    Comment: Rare-wine   Drug use: No   Sexual activity: Never  Other Topics Concern   Not on file  Social History Narrative   Took care 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   Regular exercise   Social Determinants of Health   Financial Resource Strain: Low Risk  (08/11/2022)   Overall Financial Resource Strain (CARDIA)    Difficulty of Paying Living Expenses: Not hard at all  Food Insecurity: No Food Insecurity (08/11/2022)   Hunger Vital Sign    Worried About Running Out of Food in the Last Year: Never true    Ran Out of Food in the Last Year: Never true  Transportation Needs: No Transportation Needs (08/11/2022)   PRAPARE - Radiographer, therapeutic (Medical): No    Lack of Transportation (Non-Medical): No  Physical Activity: Inactive (08/11/2022)   Exercise Vital Sign    Days of Exercise per Week: 0 days    Minutes of Exercise per Session: 0 min  Stress: No Stress Concern Present (08/11/2022)   [place]    Feeling of Stress : Not at all  Social Connections: Socially Isolated (08/11/2022)   Social Connection and Isolation Panel [  NHANES]    Frequency of Communication with Friends and Family: Twice a week    Frequency of Social Gatherings with Friends and Family: Never    Attends Religious Services: Never    Printmaker: No    Attends Music therapist: Never    Marital Status: Married    Tobacco Counseling Counseling given: Not Answered   Clinical Intake:  Pre-visit preparation completed: Yes  Pain : No/denies pain     Nutritional Risks: None Diabetes: No  How often do you need to have someone help you when you read instructions, pamphlets, or other written materials from your doctor or pharmacy?: 1 - Never  Diabetic?no  Interpreter Needed?: No  Information entered by :: Kirke Shaggy, LPN   Activities of Daily Living    08/11/2022    9:35 AM  In your present state of health, do you have any difficulty performing the following activities:  Hearing? 0  Vision? 0  Difficulty concentrating or making decisions? 0  Walking or climbing stairs? 0  Dressing or bathing? 0  Doing errands, shopping? 0  Preparing Food and eating ? N  Using the Toilet? N  In the past six months, have you accidently leaked urine? N  Do you have problems with loss of bowel control? N  Managing your Medications? N  Managing your Finances? N  Housekeeping or managing your Housekeeping? N    Patient Care Team: Tower, Wynelle Fanny, MD as PCP - General Thelma Comp, [place] as Consulting Physician (Optometry) [place] No,  Manuela Schwartz, MD as Consulting Physician (Dermatology)  Indicate any recent Medical Services you may have received from other than [facility] providers in the past year (date may be approximate).     Assessment:   This is a routine wellness examination for Diane Delacruz.  Hearing/Vision screen Hearing Screening - Comments:: No aids Vision Screening - Comments:: Wears glasses- Dr. Rick Duff  Dietary issues and exercise activities discussed: Current Exercise Habits: The patient does not participate in regular exercise at present   Goals Addressed             This Visit's Progress    DIET - EAT MORE FRUITS AND VEGETABLES         Depression Screen    08/11/2022    9:33 AM 08/09/2021    9:55 AM 07/01/2020    9:59 AM 06/20/2019   10:31 AM 06/18/2018   10:48 AM 06/12/2017   11:23 AM 05/31/2016    1:23 PM  PHQ 2/9 Scores  PHQ - 2 Score 0 0 0 0 0 0 0  PHQ- 9 Score 0   0 0 0     Fall Risk    08/11/2022    9:35 AM 08/09/2021    9:55 AM 07/01/2020    9:59 AM 06/20/2019   10:31 AM 06/18/2018   10:48 AM  Fall Risk   Falls in the past year? 0 0 0 0 0  Number falls in past yr: 0   0   Injury with Fall? 0   0   Risk for fall due to : No Fall Risks   Medication side effect   Follow up Falls prevention discussed;Falls evaluation completed Falls evaluation completed Falls evaluation completed Falls evaluation completed;Falls prevention discussed     FALL RISK PREVENTION PERTAINING TO THE HOME:  Any stairs in or around the home? Yes  If so, are there any without handrails? No  Home free of loose throw rugs in walkways,  pet beds, electrical cords, etc? Yes  Adequate lighting in your home to reduce risk of falls? Yes   ASSISTIVE DEVICES UTILIZED TO PREVENT FALLS:  Life alert? No  Use of a cane, walker or w/c? No  Grab bars in the bathroom? No  Shower chair or bench in shower? No  Elevated toilet seat or a handicapped toilet? No    Cognitive Function:    06/20/2019   10:33 AM 06/18/2018   10:48  AM 06/12/2017   11:23 AM 05/31/2016    1:26 PM  MMSE - Mini Mental State Exam  Orientation to time '5 5 5 5  '$ Orientation to Place '5 5 5 5  '$ Registration '3 3 3 3  '$ Attention/ Calculation 5 0 0 0  Recall '3 3 3 3  '$ Language- name 2 objects  0 0 0  Language- repeat '1 1 1 1  '$ Language- follow 3 step command  '3 3 3  '$ Language- read & follow direction  0 0 0  Write a sentence  0 0 0  Copy design  0 0 0  Total score  '20 20 20        '$ 08/11/2022    9:38 AM  6CIT Screen  What Year? 0 points  What month? 0 points  What time? 0 points  Count back from 20 0 points  Months in reverse 0 points  Repeat phrase 0 points  Total Score 0 points    Immunizations Immunization History  Administered Date(s) Administered   Fluad Quad(high Dose 65+) 06/25/2019, 04/21/2020, 08/09/2021   Influenza Split 04/26/2011   Influenza,inj,Quad PF,6+ Mos 04/25/2014, 04/27/2015, 05/02/2017, 04/13/2018   Influenza-Unspecified 04/27/2016   PFIZER(Purple Top)SARS-COV-2 Vaccination 11/02/2019   Pneumococcal Conjugate-13 04/25/2014   Pneumococcal Polysaccharide-23 11/02/2011   Td 11/11/2002, 02/12/2019    TDAP status: Up to date  Flu Vaccine status: Up to date  Pneumococcal vaccine status: Up to date  Covid-19 vaccine status: Completed vaccines  Qualifies for Shingles Vaccine? Yes   Zostavax completed No   Shingrix Completed?: No.    Education has been provided regarding the importance of this vaccine. Patient has been advised to call insurance company to determine out of pocket expense if they have not yet received this vaccine. Advised may also receive vaccine at local pharmacy or Health Dept. Verbalized acceptance and understanding.  Screening Tests Health Maintenance  Topic Date Due   Zoster Vaccines- Shingrix (1 of 2) Never done   COVID-19 Vaccine (2 - Pfizer risk series) 11/23/2019   MAMMOGRAM  12/25/2020   INFLUENZA VACCINE  02/15/2022   COLONOSCOPY (Pts 45-8yr Insurance coverage will need to be  confirmed)  10/06/2022   Medicare Annual Wellness (AWV)  08/12/2023   DTaP/Tdap/Td (3 - Tdap) 02/11/2029   Pneumonia Vaccine 77 Years old  Completed   DEXA SCAN  Completed   Hepatitis C Screening  Completed   HPV VACCINES  Aged Out    Health Maintenance  Health Maintenance Due  Topic Date Due   Zoster Vaccines- Shingrix (1 of 2) Never done   COVID-19 Vaccine (2 - Pfizer risk series) 11/23/2019   MAMMOGRAM  12/25/2020   INFLUENZA VACCINE  02/15/2022    Colorectal cancer screening: No longer required.   Mammogram status: No longer required due to age.  Bone Density status: Completed 12/26/19. Results reflect: Bone density results: OSTEOPOROSIS. Repeat every 2 years.  Lung Cancer Screening: (Low Dose CT Chest recommended if Age 77-80years, 30 pack-year currently smoking OR have quit w/in 15years.) does  not qualify.   Additional Screening:  Hepatitis C Screening: does qualify; Completed 05/24/16  Vision Screening: Recommended annual ophthalmology exams for early detection of glaucoma and other disorders of the eye. Is the patient up to date with their annual eye exam?  Yes  Who is the provider or what is the name of the office in which the patient attends annual eye exams? Dr.Bulakowski If pt is not established with a provider, would they like to be referred to a provider to establish care? No .   Dental Screening: Recommended annual dental exams for proper oral hygiene  Community Resource Referral / Chronic Care Management: CRR required this visit?  No   CCM required this visit?  No      Plan:     I have personally reviewed and noted the following in the patient's chart:   Medical and social history Use of alcohol, tobacco or illicit drugs  Current medications and supplements including opioid prescriptions. Patient is not currently taking opioid prescriptions. Functional ability and status Nutritional status Physical activity Advanced directives List of other  physicians Hospitalizations, surgeries, and ER visits in previous 12 months Vitals Screenings to include cognitive, depression, and falls Referrals and appointments  In addition, I have reviewed and discussed with patient certain preventive protocols, quality metrics, and best practice recommendations. A written personalized care plan for preventive services as well as general preventive health recommendations were provided to patient.     [name], LPN   [id number]   Nurse Notes: none

## 2022-08-11 NOTE — Progress Notes (Signed)
Subjective:    Patient ID: Diane Delacruz, female    DOB: 1946/03/04, 77 y.o.   MRN: [id number]  HPI Here for health maintenance exam and to review chronic medical problems    Wt Readings from Last 3 Encounters:  08/11/22 145 lb 8 oz (66 kg)  08/11/22 143 lb (64.9 kg)  04/27/22 143 lb 9.6 oz (65.1 kg)   27.05 kg/m  Vitals:   08/11/22 0947  BP: 124/68  Pulse: 83  Temp: 97.7 F (36.5 C)  SpO2: 97%     Immunization History  Administered Date(s) Administered   Fluad Quad(high Dose 65+) 06/25/2019, 04/21/2020, 08/09/2021   Influenza Split 04/26/2011   Influenza,inj,Quad PF,6+ Mos 04/25/2014, 04/27/2015, 05/02/2017, 04/13/2018   Influenza-Unspecified 04/27/2016   PFIZER(Purple Top)SARS-COV-2 Vaccination 11/02/2019   Pneumococcal Conjugate-13 04/25/2014   Pneumococcal Polysaccharide-23 11/02/2011   Td 11/11/2002, 02/12/2019   Health Maintenance Due  Topic Date Due   Zoster Vaccines- Shingrix (1 of 2) Never done   COVID-19 Vaccine (2 - Pfizer risk series) 11/23/2019   MAMMOGRAM  12/25/2020   INFLUENZA VACCINE  02/15/2022   Shingrix: unsure if she wants in future   Flu shot: declines today   Mammogram 2021 Self breast exam: no lumps   Colon screening -colonoscopy 09/2017 with 5 y recall , will be due in march Wants to schedule   Dexa   12/2019- osteoporosis  Fam hx soon  Was px alendronate and she declined  Falls: none  Fractures:none Supplements ca and D  Exercise some walking and house work  Mood    08/11/2022    9:33 AM 08/09/2021    9:55 AM 07/01/2020    9:59 AM 06/20/2019   10:31 AM 06/18/2018   10:48 AM  Depression screen PHQ 2/9  Decreased Interest 0 0 0 0 0  Down, Depressed, Hopeless 0 0 0 0 0  PHQ - 2 Score 0 0 0 0 0  Altered sleeping 0   0 0  Tired, decreased energy 0   0 0  Change in appetite 0   0 0  Feeling bad or failure about yourself  0   0 0  Trouble concentrating 0   0 0  Moving slowly or fidgety/restless 0   0 0  Suicidal thoughts  0   0 0  PHQ-9 Score 0   0 0  Difficult doing work/chores Not difficult at all   Not difficult at all Not difficult at all      H/o of graves dz Lab Results  Component Value Date   TSH 5.06 08/05/2022   Tapazole 5 mg = dose has gone up and down Sees endocrinology Skin is dry and some hair loss    GERD Nexium - tried to wean but could not tolerate   Lab Results  Component Value Date   VITAMINB12 453 08/05/2022  Took b12 for a while , but her endoc had her stop it   Vit D level is 41.9  Glucose Lab Results  Component Value Date   HGBA1C 5.8 08/05/2022  Stable  Needs to watch diet   Hyperlipidemia Lab Results  Component Value Date   CHOL 215 (H) 08/05/2022   CHOL 144 08/09/2021   CHOL 190 06/24/2020   Lab Results  Component Value Date   HDL 77.50 08/05/2022   HDL 57.70 08/09/2021   HDL 70.80 06/24/2020   Lab Results  Component Value Date   LDLCALC 120 (H) 08/05/2022   LDLCALC 73 08/09/2021  LDLCALC 102 (H) 06/24/2020   Lab Results  Component Value Date   TRIG 83.0 08/05/2022   TRIG 67.0 08/09/2021   TRIG 86.0 06/24/2020   Lab Results  Component Value Date   CHOLHDL 3 08/05/2022   CHOLHDL 2 08/09/2021   CHOLHDL 3 06/24/2020   Lab Results  Component Value Date   LDLDIRECT 170.5 04/30/2012   LDLDIRECT 144.6 10/31/2011   LDLDIRECT 137.0 12/01/2010   Pravastatin 20 mg daily  She cut it for a while to every other day - tired and legs hurt  Feels better  Ate worse the last several months More meat    Both LDL and HDL were up    Other labs Lab Results  Component Value Date   ALT 11 08/05/2022   AST 17 08/05/2022   ALKPHOS 94 08/05/2022   BILITOT 0.5 08/05/2022   Lab Results  Component Value Date   CREATININE 0.99 08/05/2022   BUN 10 08/05/2022   NA 141 08/05/2022   K 4.7 08/05/2022   CL 105 08/05/2022   CO2 28 08/05/2022   Gfr 55.5 Lab Results  Component Value Date   WBC 4.9 08/05/2022   HGB 13.3 08/05/2022   HCT 39.2  08/05/2022   MCV 94.3 08/05/2022   PLT 315.0 08/05/2022   Patient Active Problem List   Diagnosis Date Noted   Hx of adenomatous colonic polyps 08/11/2022   Graves disease 12/24/2021   Hyperthyroidism 12/24/2021   Current use of proton pump inhibitor 08/09/2021   Vitamin B12 deficiency 08/09/2021   Low TSH level 08/09/2021   Right foot pain 10/23/2019   Encounter for screening mammogram for breast cancer 06/21/2017   Elevated glucose level 06/21/2017   Estrogen deficiency 04/27/2015   Routine general medical examination at a health care facility 04/19/2015   Encounter for Medicare annual wellness exam 11/07/2012   Insomnia 02/21/2011   Family history of colon cancer 11/01/2010   Hyperlipidemia 11/01/2010   Vitamin D deficiency 10/10/2008   IRRITABLE BOWEL SYNDROME 10/15/2007   VARICOSE VEINS, LOWER EXTREMITIES 08/15/2007   Osteoporosis 05/14/2007   ALLERGIC RHINITIS, SEASONAL 05/11/2007   GERD 05/11/2007   FIBROCYSTIC BREAST DISEASE 05/11/2007   POSTMENOPAUSAL STATUS 05/11/2007   Past Medical History:  Diagnosis Date   Allergic rhinitis due to pollen    Allergy    Anxiety    past hx   Asymptomatic varicose veins    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 Trenton Gammon   Depression    Diffuse cystic mastopathy    Diverticulosis    Dyspnea 2008   negative echo and MV scan   Esophageal reflux    History of gallstones    Hypoglycemia, unspecified    Irritable bowel syndrome    Lumbago    Palpitations 03/06/09   event monitor   Pure hypercholesterolemia    Stress fracture of foot 03/28/99   left   Symptomatic menopausal or female climacteric states    Unspecified adverse effect of other drug, medicinal and biological substance(995.29)    Unspecified vitamin D deficiency    Past Surgical History:  Procedure Laterality Date   BLADDER SURGERY     tumor removed   CHOLECYSTECTOMY     gallstones-  ultrasound 10/03   COLONOSCOPY     Social History   Tobacco Use   Smoking status: Never   Smokeless tobacco: Never  Vaping Use   Vaping  Use: Never used  Substance Use Topics   Alcohol use: Yes    Alcohol/week: 0.0 standard drinks of alcohol    Comment: Rare-wine   Drug use: No   Family History  Problem Relation Age of Onset   Hyperlipidemia Mother    Stroke Mother    Arthritis Mother    Macular degeneration Mother    Irritable bowel syndrome Mother    Colon cancer Father 65       died 62   Heart disease Father        MI's   COPD Sister        heavy smoker   Colon polyps Maternal Aunt    Breast cancer Paternal 71    Osteopenia Sister    Other Daughter        celiac spruce   Diabetes Brother    Diabetes Paternal Uncle    Irritable bowel syndrome Sister    Kidney disease Cousin    Rectal cancer Neg Hx    Stomach cancer Neg Hx    Esophageal cancer Neg Hx    Liver cancer Neg Hx    Pancreatic cancer Neg Hx    Prostate cancer Neg Hx    Allergies  Allergen Reactions   Amoxicillin     Rash and diarrhea    Codeine     REACTION: unknown reaction   Flexeril [Cyclobenzaprine]     sedation   Klonopin [Clonazepam]     Dizziness , fatigue   Nsaids     REACTION: diarrhea   Omeprazole     REACTION: did not work for her   Pseudoephedrine     REACTION: jittery   Tessalon [Benzonatate] Rash    Rash   Current Outpatient Medications on File Prior to Visit  Medication Sig Dispense Refill   Calcium-Magnesium-Vitamin D (CALCIUM 1200+D3 PO) Take 1 tablet by mouth daily.     Coenzyme Q10-Vitamin E (QUNOL ULTRA COQ10) 100-150 MG-UNIT CAPS      fexofenadine (ALLEGRA) 60 MG tablet Take 180 mg by mouth daily as needed for allergies.     methimazole (TAPAZOLE) 5 MG tablet Take 1 tablet (5 mg total) by mouth as directed. 1 tablet Monday through Saturday (skips Sundays) 72 tablet 2   mometasone (NASONEX) 50 MCG/ACT nasal spray Place 2 sprays into the nose daily as needed  (allergies). 17 g 11   No current facility-administered medications on file prior to visit.      Review of Systems  Constitutional:  Negative for activity change, appetite change, fatigue, fever and unexpected weight change.  HENT:  Negative for congestion, ear pain, rhinorrhea, sinus pressure and sore throat.   Eyes:  Negative for pain, redness and visual disturbance.  Respiratory:  Negative for cough, shortness of breath and wheezing.   Cardiovascular:  Negative for chest pain and palpitations.  Gastrointestinal:  Negative for abdominal pain, blood in stool, constipation and diarrhea.  Endocrine: Negative for polydipsia and polyuria.  Genitourinary:  Negative for dysuria, frequency and urgency.  Musculoskeletal:  Negative for arthralgias, back pain and myalgias.  Skin:  Negative for pallor and rash.  Allergic/Immunologic: Negative for environmental allergies.  Neurological:  Negative for dizziness, syncope and headaches.  Hematological:  Negative for adenopathy. Does not bruise/bleed easily.  Psychiatric/Behavioral:  Negative for decreased concentration and dysphoric mood. The patient is not nervous/anxious.        Objective:   Physical Exam Constitutional:      General: She is not in acute distress.  Appearance: Normal appearance. She is well-developed and normal weight. She is not ill-appearing or diaphoretic.  HENT:     Head: Normocephalic and atraumatic.     Right Ear: Tympanic membrane, ear canal and external ear normal.     Left Ear: Tympanic membrane, ear canal and external ear normal.     Nose: Nose normal. No congestion.     Mouth/Throat:     Mouth: Mucous membranes are moist.     Pharynx: Oropharynx is clear. No posterior oropharyngeal erythema.  Eyes:     General: No scleral icterus.    Extraocular Movements: Extraocular movements intact.     Conjunctiva/sclera: Conjunctivae normal.     Pupils: Pupils are equal, round, and reactive to light.  Neck:      Thyroid: No thyromegaly.     Vascular: No carotid bruit or JVD.  Cardiovascular:     Rate and Rhythm: Normal rate and regular rhythm.     Pulses: Normal pulses.     Heart sounds: Normal heart sounds.     No gallop.  Pulmonary:     Effort: Pulmonary effort is normal. No respiratory distress.     Breath sounds: Normal breath sounds. No wheezing.     Comments: Good air exch Chest:     Chest wall: No tenderness.  Abdominal:     General: Bowel sounds are normal. There is no distension or abdominal bruit.     Palpations: Abdomen is soft. There is no mass.     Tenderness: There is no abdominal tenderness.     Hernia: No hernia is present.  Genitourinary:    Comments: Breast exam: No mass, nodules, thickening, tenderness, bulging, retraction, inflamation, nipple discharge or skin changes noted.  No axillary or clavicular LA.     Musculoskeletal:        General: No tenderness. Normal range of motion.     Cervical back: Normal range of motion and neck supple. No rigidity. No muscular tenderness.     Right lower leg: No edema.     Left lower leg: No edema.     Comments: No kyphosis   Lymphadenopathy:     Cervical: No cervical adenopathy.  Skin:    General: Skin is warm and dry.     Coloration: Skin is not pale.     Findings: No erythema or rash.     Comments: Solar lentigines diffusely Scattered SKs  Neurological:     Mental Status: She is alert. Mental status is at baseline.     Cranial Nerves: No cranial nerve deficit.     Motor: No abnormal muscle tone.     Coordination: Coordination normal.     Gait: Gait normal.     Deep Tendon Reflexes: Reflexes are normal and symmetric. Reflexes normal.  Psychiatric:        Mood and Affect: Mood normal.        Cognition and Memory: Cognition and memory normal.           Assessment & Plan:   Problem List Items Addressed This Visit       Digestive   GERD    Unable to wean nexium after trying several times Conitnue this 40 mg daily    Enc good fluids Watch diet   Follow dexa, renal status and vit levels       Relevant Medications   esomeprazole (NEXIUM) 40 MG capsule     Endocrine   Hyperthyroidism    Continues tapazole for Graves' dz from  endocrinology        Musculoskeletal and Integument   Osteoporosis    Dexa 2021 Is ordered-pt will schedule Has fam history  Never did start alendronate-given info to re consider No falls or fx On ca and D Enc strength training         Other   Current use of proton pump inhibitor    Watching Renal status Bone health  B12 and D       Elevated glucose level    Lab Results  Component Value Date   HGBA1C 5.8 08/05/2022  disc imp of low glycemic diet and wt loss to prevent DM2       Encounter for screening mammogram for breast cancer   Relevant Orders   MM 3D SCREEN BREAST BILATERAL   Estrogen deficiency   Relevant Orders   DG Bone Density   Family history of colon cancer    Due for colonoscopy 09/2022      Hx of adenomatous colonic polyps    Due for 5 y recall colonosocpy 09/2022 Referral done Pt will call to schedule       Relevant Orders   Ambulatory referral to Gastroenterology   Hyperlipidemia    Disc goals for lipids and reasons to control them Rev last labs with pt Rev low sat fat diet in detail Taking pravastatin every other day now due to side eff Ratio stable but LDL up  HDL up also   Will continue to follow  Diet will improve as well      Relevant Medications   pravastatin (PRAVACHOL) 20 MG tablet   Routine general medical examination at a health care facility - Primary    Reviewed health habits including diet and exercise and skin cancer prevention Reviewed appropriate screening tests for age  Also reviewed health mt list, fam hx and immunization status , as well as social and family history   See HPI Labs rev  Disc shingrix vaccine -not ready for it Flu shot declined  Mammogram ordered-pt will schedule  GI ref for 5  year recall colonoscopy  Dexa due-ordered and pt to schedule  No falls or fx/disc fall prev PHQ of 0 Enc some strength training exercise       Vitamin B12 deficiency   Vitamin D deficiency

## 2022-08-11 NOTE — Assessment & Plan Note (Signed)
Dexa 2021 Is ordered-pt will schedule Has fam history  Never did start alendronate-given info to re consider No falls or fx On ca and D Enc strength training

## 2022-09-01 ENCOUNTER — Telehealth: Payer: Self-pay | Admitting: *Deleted

## 2022-09-01 NOTE — Telephone Encounter (Signed)
Pt.scheduled for pre-visit 09/20/22,procedure 10/04/22,last recall assessment was noted on 09/01/16,please advise if need OV or proceed with scheduled procedure ?

## 2022-09-01 NOTE — Telephone Encounter (Signed)
Evaluated by her PCP on Aug 11, 2022 and she is in good health. With a family history of colon cancer (father) and a personal history of adenomatous colon polyps I recommend to proceed with colonoscopy at a 5 year interval as planned.

## 2022-09-01 NOTE — Telephone Encounter (Signed)
Noted  

## 2022-09-20 ENCOUNTER — Encounter: Payer: Self-pay | Admitting: Gastroenterology

## 2022-09-20 ENCOUNTER — Ambulatory Visit (AMBULATORY_SURGERY_CENTER): Payer: Medicare Other

## 2022-09-20 VITALS — Ht 60.0 in | Wt 142.0 lb

## 2022-09-20 DIAGNOSIS — Z86010 Personal history of colonic polyps: Secondary | ICD-10-CM

## 2022-09-20 DIAGNOSIS — Z800 Family history of malignant neoplasm of digestive organs: Secondary | ICD-10-CM

## 2022-09-20 MED ORDER — NA SULFATE-K SULFATE-MG SULF 17.5-3.13-1.6 GM/177ML PO SOLN: 1.0000 | Freq: Once | ORAL | 0 refills | Status: AC

## 2022-09-20 NOTE — Progress Notes (Signed)
No egg or soy allergy known to patient  No issues known to pt with past sedation with any surgeries or procedures Patient denies ever being told they had issues or difficulty with intubation  No FH of Malignant Hyperthermia Pt is not on diet pills Pt is not on  home 02  Pt is not on blood thinners  Pt intermittent constipation but not on a regular basis  No A fib or A flutter Have any cardiac testing pending-- NO  Pt instructed to use Singlecare.[email or web address] or GoodRx for a price reduction on prep

## 2022-10-04 ENCOUNTER — Encounter: Payer: Medicare Other | Admitting: Gastroenterology

## 2022-10-05 ENCOUNTER — Other Ambulatory Visit: Payer: Self-pay | Admitting: Family Medicine

## 2022-10-19 ENCOUNTER — Ambulatory Visit: Payer: Medicare Other

## 2022-10-20 ENCOUNTER — Ambulatory Visit
Admission: RE | Admit: 2022-10-20 | Discharge: 2022-10-20 | Disposition: A | Discharge status: Home / Self Care | Payer: Medicare Other | Source: Ambulatory Visit | Attending: Family Medicine | Admitting: Family Medicine

## 2022-10-20 DIAGNOSIS — Z1231 Encounter for screening mammogram for malignant neoplasm of breast: Secondary | ICD-10-CM

## 2022-10-31 ENCOUNTER — Ambulatory Visit: Payer: Medicare Other | Admitting: Internal Medicine

## 2022-10-31 ENCOUNTER — Encounter: Payer: Self-pay | Admitting: Internal Medicine

## 2022-10-31 VITALS — BP 118/72 | HR 70 | Ht 60.0 in | Wt 149.0 lb

## 2022-10-31 DIAGNOSIS — E0590 Thyrotoxicosis, unspecified without thyrotoxic crisis or storm: Secondary | ICD-10-CM

## 2022-10-31 DIAGNOSIS — E0500 Thyrotoxicosis with diffuse goiter without thyrotoxic crisis or storm: Secondary | ICD-10-CM

## 2022-10-31 LAB — T4, FREE: Free T4: 0.7 ng/dL (ref 0.60–1.60)

## 2022-10-31 LAB — TSH: TSH: 2.58 u[IU]/mL (ref 0.35–5.50)

## 2022-10-31 MED ORDER — METHIMAZOLE 5 MG PO TABS: 5.0000 mg | ORAL_TABLET | ORAL | 3 refills | Status: DC

## 2022-10-31 NOTE — Patient Instructions (Signed)
Selenium 200 mcg daily.

## 2022-10-31 NOTE — Progress Notes (Signed)
Name: Diane Delacruz  MRN/ DOB: [id number], May 16, 1946    Age/ Sex: 77 y.o., female    PCP: Judy Pimple, MD   Reason for Endocrinology Evaluation: Hyperthyroidism     Date of Initial Endocrinology Evaluation: 09/23/2021    HPI: Diane Delacruz is a 77 y.o. female with a past medical history of Osteoporosis and Graves' disease. The patient presented for initial endocrinology clinic visit on 09/23/2021 for consultative assistance with her Hyperthyroidism .   Pt has been diagnosed with hyperthyroidism in 07/2021 with a suppressed TSH at 0.01 and elevated FT4 at 5.8 pg/mL . She was having weight loss at the time  TRAb elevated 9.23 IU/L Started methimazole 09/2021  Of note the pt has osteoporosis    Mother with thyroid disease ( hypothyroidism )    SUBJECTIVE:    Today (10/31/22):  Diane Delacruz is here for a follow up on hyperthyroidism management .  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 Dutch Quint   Depression    Diffuse cystic mastopathy    Diverticulosis    Dyspnea 2008   negative echo and MV scan   Esophageal reflux    History of gallstones    Hypoglycemia, unspecified    Irritable bowel syndrome    Lumbago    Palpitations 03/06/09   event monitor   Pure hypercholesterolemia    Stress fracture of foot 03/28/99   left   Symptomatic menopausal or female climacteric states    Unspecified adverse effect of other drug, medicinal  and biological substance(995.29)    Unspecified vitamin D deficiency    Past Surgical History:  Past Surgical History:  Procedure Laterality Date   BLADDER SURGERY     tumor removed   CHOLECYSTECTOMY     gallstones- ultrasound 10/03   COLONOSCOPY      Social History:  reports that she has never smoked. She has never used smokeless tobacco. She reports current alcohol use. She reports that she does not use drugs. Family History: family history includes Arthritis in her mother; Breast cancer in her paternal aunt; COPD in her sister; Colon cancer (age of onset: 58) in her father; Colon polyps in her brother and maternal aunt; Diabetes in her brother and paternal uncle; Heart disease in her father; Hyperlipidemia in her mother; Irritable bowel syndrome in her mother and sister; Kidney disease in her cousin; Macular degeneration in her mother; Osteopenia in her sister; Other in her daughter; Stroke in her mother. She was adopted.   HOME MEDICATIONS: Allergies as of 10/31/2022       Reactions   Amoxicillin    Rash and diarrhea    Codeine    REACTION: unknown reaction   Flexeril [cyclobenzaprine]    sedation   Klonopin [clonazepam]    Dizziness , fatigue   Nsaids  REACTION: diarrhea   Omeprazole    REACTION: did not work for her   Pseudoephedrine    REACTION: jittery   Tessalon [benzonatate] Rash   Rash        Medication List        Accurate as of October 31, 2022 10:32 AM. If you have any questions, ask your nurse or doctor.          CALCIUM 1200+D3 PO Take 1 tablet by mouth daily.   esomeprazole 40 MG capsule Commonly known as: NEXIUM TAKE ONE CAPSULE BY MOUTH ONCE DAILY AT NOON   fexofenadine 60 MG tablet Commonly known as: ALLEGRA Take 180 mg by mouth daily as needed for allergies.   methimazole 5 MG tablet Commonly known as: TAPAZOLE Take 1 tablet (5 mg total) by mouth as directed. 1 tablet Monday through Saturday (skips Sundays) What changed: additional  instructions   mometasone 50 MCG/ACT nasal spray Commonly known as: NASONEX Place 2 sprays into the nose daily as needed (allergies).   pravastatin 20 MG tablet Commonly known as: PRAVACHOL Take 1 tablet (20 mg total) by mouth every other day.   Qunol Ultra CoQ10 100-150 MG-UNIT Caps Generic drug: Coenzyme Q10-Vitamin E          REVIEW OF SYSTEMS: A comprehensive ROS was conducted with the patient and is negative except as per HPI    OBJECTIVE:  VS: BP 118/72 (BP Location: Left Arm, Patient Position: Sitting, Cuff Size: Small)   Pulse 70   Ht 5' (1.524 m)   Wt 149 lb (67.6 kg)   LMP 07/19/1995   SpO2 96%   BMI 29.10 kg/m    Wt Readings from Last 3 Encounters:  10/31/22 149 lb (67.6 kg)  09/20/22 142 lb (64.4 kg)  08/11/22 145 lb 8 oz (66 kg)     EXAM: General: Pt appears well and is in NAD  Eyes: External eye exam normal without stare, lid lag or exophthalmos.  EOM intact.    Neck: General: Supple without adenopathy. Thyroid: Thyroid size normal.  No goiter or nodules appreciated.   Lungs: Clear with good BS bilat with no rales, rhonchi, or wheezes  Heart: Auscultation: RRR.  Abdomen: Normoactive bowel sounds, soft, nontender, without masses or organomegaly palpable  Extremities:  BL LE: No pretibial edema normal ROM and strength.  Mental Status: Judgment, insight: Intact Orientation: Oriented to time, place, and person Mood and affect: No depression, anxiety, or agitation     DATA REVIEWED:   Latest Reference Range & Units 10/31/22 10:39  TSH 0.35 - 5.50 uIU/mL 2.58  T4,Free(Direct) 0.60 - 1.60 ng/dL 7.10      Latest Reference Range & Units 08/05/22 08:56  Sodium 135 - 145 mEq/L 141  Potassium 3.5 - 5.1 mEq/L 4.7  Chloride 96 - 112 mEq/L 105  CO2 19 - 32 mEq/L 28  Glucose 70 - 99 mg/dL 99  BUN 6 - 23 mg/dL 10  Creatinine 6.26 - 9.48 mg/dL 5.46  Calcium 8.4 - 27.0 mg/dL 9.6  Alkaline Phosphatase 39 - 117 U/L 94  Albumin 3.5 - 5.2 g/dL 4.0  AST  0 - 37 U/L 17  ALT 0 - 35 U/L 11  Total Protein 6.0 - 8.3 g/dL 6.2  Total Bilirubin 0.2 - 1.2 mg/dL 0.5  GFR >35.00 mL/min 55.53 (L)  (L): Data is abnormally low     Latest Reference Range & Units 09/23/21 11:59  TRAB <=2.00 IU/L 9.23 (H)     Thyroid ULtrasound 09/29/2021  Estimated total number of nodules >/= 1 cm: 2   Number of spongiform nodules >/=  2 cm not described below (TR1): 0   Number of mixed cystic and solid nodules >/= 1.5 cm not described below (TR2): 0   _________________________________________________________   Nodule # 1:   Location: Right; mid   Maximum size: 1.5 cm; Other 2 dimensions: 1.3 x 0.8 cm   Composition: mixed cystic and solid (1)   Echogenicity: isoechoic (1)   Shape: not taller-than-wide (0)   Margins: smooth (0)   Echogenic foci: none (0)   ACR TI-RADS total points: 2.   ACR TI-RADS risk category: TR2 (2 points).   ACR TI-RADS recommendations:   This nodule does NOT meet TI-RADS criteria for biopsy or dedicated follow-up.   _________________________________________________________   1.0 x 0.9 x 0.7 cm hypoechoic solid nodule located adjacent to the inferior tip of the right thyroid lobe is suspicious for a parathyroid adenoma.   IMPRESSION: 1.0 x 0.9 x 0.7 cm solid hypoechoic nodule appears adjacent to the inferior tip of the right thyroid lobe is suspicious for a parathyroid adenoma. Correlation with laboratory workup for hyperparathyroidism and nuclear medicine parathyroid scan would be beneficial. If these findings are negative for parathyroid lesion, a follow-up ultrasound of the thyroid should be performed in 1 year to again evaluate this nodule.   ASSESSMENT/PLAN/RECOMMENDATIONS:   Hyperthyroidism:  -Patient is clinically euthyroid -No local neck symptoms -TFTs today continues to show normal results, will continue to reduce methimazole as below   Medications : Decrease methimazole 5 mg , 1 tablet 3 days  a week    2. Graves' Disease:  -No extrathyroidal manifestations of Graves' disease - Pt with mild eye symptoms   Pt to start Selenium 200 mcg daily     3. 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 [place] [place], [place] [address] Phone: [phone] FAX: [phone]   CC: Tower, Audrie Gallus, MD [address] [place] [address] Phone: [phone] Fax: [phone]   Return to Endocrinology clinic as below: Future Appointments  Date Time Provider Department Center  02/03/2023  3:00 PM [facility] DX DEXA 1 [facility]  08/14/2023 11:45 AM [facility] ANNUAL WELLNESS VISIT 1 [facility]

## 2022-11-11 DIAGNOSIS — H5203 Hypermetropia, bilateral: Secondary | ICD-10-CM | POA: Diagnosis not present

## 2022-11-11 DIAGNOSIS — H52223 Regular astigmatism, bilateral: Secondary | ICD-10-CM | POA: Diagnosis not present

## 2022-11-11 DIAGNOSIS — H2513 Age-related nuclear cataract, bilateral: Secondary | ICD-10-CM | POA: Diagnosis not present

## 2022-11-11 DIAGNOSIS — H524 Presbyopia: Secondary | ICD-10-CM | POA: Diagnosis not present

## 2022-11-22 ENCOUNTER — Ambulatory Visit
Admission: RE | Admit: 2022-11-22 | Discharge: 2022-11-22 | Disposition: A | Discharge status: Home / Self Care | Payer: Medicare Other | Source: Ambulatory Visit | Attending: Internal Medicine | Admitting: Internal Medicine

## 2022-11-22 DIAGNOSIS — E0590 Thyrotoxicosis, unspecified without thyrotoxic crisis or storm: Secondary | ICD-10-CM

## 2022-11-22 DIAGNOSIS — E049 Nontoxic goiter, unspecified: Secondary | ICD-10-CM | POA: Diagnosis not present

## 2022-12-09 DIAGNOSIS — H524 Presbyopia: Secondary | ICD-10-CM | POA: Diagnosis not present

## 2022-12-22 ENCOUNTER — Ambulatory Visit (INDEPENDENT_AMBULATORY_CARE_PROVIDER_SITE_OTHER): Payer: Medicare Other | Admitting: Family

## 2022-12-22 VITALS — BP 122/72 | HR 74 | Temp 97.9°F | Ht 60.0 in | Wt 145.6 lb

## 2022-12-22 DIAGNOSIS — W540XXA Bitten by dog, initial encounter: Secondary | ICD-10-CM | POA: Insufficient documentation

## 2022-12-22 DIAGNOSIS — S41152A Open bite of left upper arm, initial encounter: Secondary | ICD-10-CM | POA: Insufficient documentation

## 2022-12-22 MED ORDER — SULFAMETHOXAZOLE-TRIMETHOPRIM 800-160 MG PO TABS
1.0000 | ORAL_TABLET | Freq: Two times a day (BID) | ORAL | 0 refills | Status: AC
Start: 2022-12-22 — End: 2022-12-29

## 2022-12-22 NOTE — Progress Notes (Signed)
Established Patient Office Visit  Subjective:   Patient ID: Diane Delacruz, female    DOB: [birth year]  Age: 77 y.o. MRN: [id number]  CC:  Chief Complaint  Patient presents with   Animal Bite    Dog bite on left arm that happened 2 days ago.    HPI: Diane Delacruz is a 77 y.o. female presenting on 12/22/2022 for Animal Bite (Dog bite on left arm that happened 2 days ago.)   Animal Bite     Two days ago during a storm her dog was pretty afraid, and she was trying to get on top of a table so pt went to grab her to help her down, and when she reached over to her the dog bit her on the left arm. She states today a slight bit better but yesterday was pretty red and she was worried about infection.   Up to date on her tetanus, this was last 02/12/2019.        ROS: Negative unless specifically indicated above in HPI.   Relevant past medical history reviewed and updated as indicated.   Allergies and medications reviewed and updated.   Current Outpatient Medications:    Calcium-Magnesium-Vitamin D (CALCIUM 1200+D3 PO), Take 1 tablet by mouth daily., Disp: , Rfl:    Coenzyme Q10-Vitamin E (QUNOL ULTRA COQ10) 100-150 MG-UNIT CAPS, , Disp: , Rfl:    esomeprazole (NEXIUM) 40 MG capsule, TAKE ONE CAPSULE BY MOUTH ONCE DAILY AT NOON, Disp: 90 capsule, Rfl: 3   fexofenadine (ALLEGRA) 60 MG tablet, Take 180 mg by mouth daily as needed for allergies., Disp: , Rfl:    methimazole (TAPAZOLE) 5 MG tablet, Take 1 tablet (5 mg total) by mouth as directed. 1 tablet Monday through Wednesday  (skips rest of the week), Disp: 36 tablet, Rfl: 3   mometasone (NASONEX) 50 MCG/ACT nasal spray, Place 2 sprays into the nose daily as needed (allergies)., Disp: 17 g, Rfl: 11   pravastatin (PRAVACHOL) 20 MG tablet, Take 1 tablet (20 mg total) by mouth every other day., Disp: 45 tablet, Rfl: 3   sulfamethoxazole-trimethoprim (BACTRIM DS) 800-160 MG tablet, Take 1 tablet by mouth 2 (two) times daily for 7  days., Disp: 14 tablet, Rfl: 0  Allergies  Allergen Reactions   Amoxicillin     Rash and diarrhea    Codeine     REACTION: unknown reaction   Flexeril [Cyclobenzaprine]     sedation   Klonopin [Clonazepam]     Dizziness , fatigue   Nsaids     REACTION: diarrhea   Omeprazole     REACTION: did not work for her   Pseudoephedrine     REACTION: jittery   Tessalon [Benzonatate] Rash    Rash    Objective:   BP 122/72   Pulse 74   Temp 97.9 F (36.6 C) (Temporal)   Ht 5' (1.524 m)   Wt 145 lb 9.6 oz (66 kg)   LMP 07/19/1995   SpO2 98%   BMI 28.44 kg/m    Physical Exam Skin:    Comments: Left anterior lower forearm with puncture wound from dog bite with surrounding erythema, warm to site.       .   Assessment & Plan:  Dog bite of left upper extremity, initial encounter Assessment & Plan: Rx bactrim 800/160 mg po bid x 7 days Intolerance to amox so augmentin not a great choice.  Pt advised to:  Please monitor site for worsening signs/symptoms of infection  to include: increasing redness, increasing tenderness, increase in size, and or pustulant drainage from site. If this is to occur please let me know immediately.  Tetanus up to date.  Orders: -     Sulfamethoxazole-Trimethoprim; Take 1 tablet by mouth 2 (two) times daily for 7 days.  Dispense: 14 tablet; Refill: 0     Follow up plan: Return for f/u PCP if no improvement in symptoms.  Mort Sawyers, FNP

## 2022-12-22 NOTE — Assessment & Plan Note (Addendum)
Rx bactrim 800/160 mg po bid x 7 days Intolerance to amox so augmentin not a great choice.  Pt advised to:  Please monitor site for worsening signs/symptoms of infection to include: increasing redness, increasing tenderness, increase in size, and or pustulant drainage from site. If this is to occur please let me know immediately.  Tetanus up to date.

## 2023-01-24 ENCOUNTER — Ambulatory Visit (INDEPENDENT_AMBULATORY_CARE_PROVIDER_SITE_OTHER): Payer: Medicare Other | Admitting: Family Medicine

## 2023-01-24 ENCOUNTER — Encounter: Payer: Self-pay | Admitting: Family Medicine

## 2023-01-24 VITALS — BP 118/82 | HR 76 | Temp 98.0°F | Ht 60.0 in | Wt 142.0 lb

## 2023-01-24 DIAGNOSIS — R197 Diarrhea, unspecified: Secondary | ICD-10-CM | POA: Diagnosis not present

## 2023-01-24 NOTE — Progress Notes (Signed)
Diarrhea and stomach cramping, HA and not eating much x 5 days. Patient has taken imodium w/o relief.  Prev colonoscopy with multiple small-mouthed diverticula were found in the left colon.  Sx started w/o clear trigger or cause, atypical foods.  Initially with nausea.  Then had diarrhea frequently for a few days.  Upper abd discomfort with eating, then watery diarrhea.  She has sensation of hunger but not pain prior to eating.  No vomiting.  No fevers.  No one else is sick.  No dysuria.  No blood in stool.  Noted mucous in stools.  No black stools.  Was on septra ~1 month ago.    Diarrhea frequency is better in the meantime, down from 6-8 per day to less than that.  No pain currently.    Meds, vitals, and allergies reviewed.   ROS: Per HPI unless specifically indicated in ROS section   GEN: nad, alert and oriented HEENT: ncat NECK: supple w/o LA CV: rrr.  PULM: ctab, no inc wob ABD: soft, +bs, not ttp EXT: no edema SKIN: well perfused.

## 2023-01-24 NOTE — Patient Instructions (Addendum)
Drink plenty of clear liquids, eat some bland protein but minimal fiber in the meantime.   Go to the lab on the way out.   If you have mychart we'll likely use that to update you.    Take care.  Glad to see you. Limit dairy in the meantime

## 2023-01-24 NOTE — Assessment & Plan Note (Signed)
Not likely diverticulitis- no abd pain now.  Drink plenty of clear liquids, eat some bland protein but minimal fiber in the meantime.   Check stool studies given prev abx use.   Limit dairy in the meantime.   D/w pt. Okay for outpatient f/u.  She agrees with plan.

## 2023-02-03 ENCOUNTER — Other Ambulatory Visit: Payer: Medicare Other

## 2023-05-03 ENCOUNTER — Encounter: Payer: Self-pay | Admitting: Internal Medicine

## 2023-05-03 ENCOUNTER — Ambulatory Visit: Payer: Medicare Other | Admitting: Internal Medicine

## 2023-05-03 VITALS — BP 120/70 | HR 68 | Ht 60.0 in | Wt 145.8 lb

## 2023-05-03 DIAGNOSIS — E0590 Thyrotoxicosis, unspecified without thyrotoxic crisis or storm: Secondary | ICD-10-CM

## 2023-05-03 DIAGNOSIS — E215 Disorder of parathyroid gland, unspecified: Secondary | ICD-10-CM

## 2023-05-03 LAB — ALBUMIN: Albumin: 4.1 g/dL (ref 3.5–5.2)

## 2023-05-03 LAB — VITAMIN D 25 HYDROXY (VIT D DEFICIENCY, FRACTURES): VITD: 45.13 ng/mL (ref 30.00–100.00)

## 2023-05-03 LAB — TSH: TSH: 2.48 u[IU]/mL (ref 0.35–5.50)

## 2023-05-03 LAB — T4, FREE: Free T4: 1.02 ng/dL (ref 0.60–1.60)

## 2023-05-03 NOTE — Progress Notes (Unsigned)
Name: Diane Delacruz  MRN/ DOB: [id number], [birth year]    Age/ Sex: 77 y.o., female    PCP: Judy Pimple, MD   Reason for Endocrinology Evaluation: Hyperthyroidism     Date of Initial Endocrinology Evaluation: 09/23/2021    HPI: Diane Delacruz is a 77 y.o. female with a past medical history of Osteoporosis and Graves' disease. The patient presented for initial endocrinology clinic visit on 09/23/2021 for consultative assistance with her Hyperthyroidism .   Pt has been diagnosed with hyperthyroidism in 07/2021 with a suppressed TSH at 0.01 and elevated FT4 at 5.8 pg/mL . She was having weight loss at the time  TRAb elevated 9.23 IU/L Started methimazole 09/2021  Of note the pt has osteoporosis    Mother with thyroid disease ( hypothyroidism )    SUBJECTIVE:    Today (05/03/23):  Diane Delacruz is here for a follow up on hyperthyroidism management .  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 Dutch Quint   Depression    Diffuse cystic mastopathy    Diverticulosis    Dyspnea 2008   negative echo and MV scan   Esophageal reflux    History of gallstones    Hypoglycemia, unspecified    Irritable bowel syndrome    Lumbago    Palpitations 03/06/09   event monitor   Pure hypercholesterolemia    Stress fracture of foot 03/28/99   left    Symptomatic menopausal or female climacteric states    Unspecified adverse effect of other drug, medicinal and biological substance(995.29)    Unspecified vitamin D deficiency    Past Surgical History:  Past Surgical History:  Procedure Laterality Date   BLADDER SURGERY     tumor removed   CHOLECYSTECTOMY     gallstones- ultrasound 10/03   COLONOSCOPY      Social History:  reports that she has never smoked. She has never used smokeless tobacco. She reports current alcohol use. She reports that she does not use drugs. Family History: family history includes Arthritis in her mother; Breast cancer in her paternal aunt; COPD in her sister; Colon cancer (age of onset: 64) in her father; Colon polyps in her brother and maternal aunt; Diabetes in her brother and paternal uncle; Heart disease in her father; Hyperlipidemia in her mother; Irritable bowel syndrome in her mother and sister; Kidney disease in her cousin; Macular degeneration in her mother; Osteopenia in her sister; Other in her daughter; Stroke in her mother. She was adopted.   HOME MEDICATIONS: Allergies as of 05/03/2023       Reactions   Amoxicillin    Rash and diarrhea    Codeine    REACTION: unknown reaction  Flexeril [cyclobenzaprine]    sedation   Klonopin [clonazepam]    Dizziness , fatigue   Nsaids    REACTION: diarrhea   Omeprazole    REACTION: did not work for her   Pseudoephedrine    REACTION: jittery   Tessalon [benzonatate] Rash   Rash        Medication List        Accurate as of May 03, 2023  9:15 AM. If you have any questions, ask your nurse or doctor.          CALCIUM 1200+D3 PO Take 1 tablet by mouth daily.   esomeprazole 40 MG capsule Commonly known as: NEXIUM TAKE ONE CAPSULE BY MOUTH ONCE DAILY AT NOON   fexofenadine 60 MG tablet Commonly known as: ALLEGRA Take 180 mg by mouth daily as needed for allergies.   methimazole 5 MG tablet Commonly known as: TAPAZOLE Take 1 tablet  (5 mg total) by mouth as directed. 1 tablet Monday through Wednesday  (skips rest of the week)   mometasone 50 MCG/ACT nasal spray Commonly known as: NASONEX Place 2 sprays into the nose daily as needed (allergies).   pravastatin 20 MG tablet Commonly known as: PRAVACHOL Take 1 tablet (20 mg total) by mouth every other day.   Qunol Ultra CoQ10 100-150 MG-UNIT Caps Generic drug: Coenzyme Q10-Vitamin E          REVIEW OF SYSTEMS: A comprehensive ROS was conducted with the patient and is negative except as per HPI    OBJECTIVE:  VS: BP 120/70 (BP Location: Left Arm, Patient Position: Sitting, Cuff Size: Small)   Pulse 68   Ht 5' (1.524 m)   Wt 145 lb 12.8 oz (66.1 kg)   LMP 07/19/1995   SpO2 99%   BMI 28.47 kg/m    Wt Readings from Last 3 Encounters:  05/03/23 145 lb 12.8 oz (66.1 kg)  01/24/23 142 lb (64.4 kg)  12/22/22 145 lb 9.6 oz (66 kg)     EXAM: General: Pt appears well and is in NAD  Neck: General: Supple without adenopathy. Thyroid: Thyroid size normal.  No goiter or nodules appreciated.   Lungs: Clear with good BS bilat   Heart: Auscultation: RRR.  Abdomen: Normoactive bowel sounds, soft, nontender, without masses or organomegaly palpable  Extremities:  BL LE: No pretibial edema   Mental Status: Judgment, insight: Intact Orientation: Oriented to time, place, and person Mood and affect: No depression, anxiety, or agitation     DATA REVIEWED:   Latest Reference Range & Units 05/03/23 09:51  Calcium 8.6 - 10.4 mg/dL 9.7  Albumin 3.5 - 5.2 g/dL 4.1    Latest Reference Range & Units 05/03/23 09:51  VITD 30.00 - 100.00 ng/mL 45.13  PTH, Intact 16 - 77 pg/mL 39  TSH 0.35 - 5.50 uIU/mL 2.48    Latest Reference Range & Units 05/03/23 09:51  T4,Free(Direct) 0.60 - 1.60 ng/dL 1.61    Latest Reference Range & Units 08/05/22 08:56  Sodium 135 - 145 mEq/L 141  Potassium 3.5 - 5.1 mEq/L 4.7  Chloride 96 - 112 mEq/L 105  CO2 19 - 32 mEq/L 28  Glucose 70  - 99 mg/dL 99  BUN 6 - 23 mg/dL 10  Creatinine 0.96 - 0.45 mg/dL 4.09  Calcium 8.4 - 81.1 mg/dL 9.6  Alkaline Phosphatase 39 - 117 U/L 94  Albumin 3.5 - 5.2 g/dL 4.0  AST 0 - 37 U/L 17  ALT 0 - 35 U/L 11  Total Protein  6.0 - 8.3 g/dL 6.2  Total Bilirubin 0.2 - 1.2 mg/dL 0.5  GFR >16.10 mL/min 55.53 (L)  (L): Data is abnormally low     Latest Reference Range & Units 09/23/21 11:59  TRAB <=2.00 IU/L 9.23 (H)     Thyroid ULtrasound 11/22/2022  Estimated total number of nodules >/= 1 cm: 1   Number of spongiform nodules >/=  2 cm not described below (TR1): 0   Number of mixed cystic and solid nodules >/= 1.5 cm not described below (TR2): 0   _________________________________________________________   Circumscribed hypoechoic nodule deep to the inferior aspect of the right lower gland is stable at 1.1 x 0.8 x 0.8 cm. No significant enlargement or change in appearance.   No new thyroid nodules or suspicious features in the thyroid gland.   IMPRESSION: Stable appearance of 1.1 cm hypoechoic solid nodule deep and inferior to the right inferior thyroid gland. Differential considerations include nonfunctioning parathyroid adenoma (given normal PTH and calcium), or less likely an exophytic thyroid nodule.   Otherwise, mildly heterogeneous but unremarkable thyroid gland. No other thyroid nodules noted that would meet criteria for further evaluation.  ASSESSMENT/PLAN/RECOMMENDATIONS:   Hyperthyroidism:  -Patient is clinically euthyroid -No local neck symptoms -TFTs remain normal, will decrease methimazole as below  Medications : Decrease methimazole 5 mg , 1 tablet 2 days a week    2. 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 [place] [place], [place] [address] Phone: [phone] FAX: [phone]   CC: Tower, Audrie Gallus, MD [address] [place] [address] Phone: [phone] Fax: [phone]   Return to Endocrinology clinic as below: Future Appointments  Date Time Provider Department Center  08/01/2023  2:30 PM [facility] DX DEXA 1 [facility]  08/15/2023  9:45 AM [facility] ANNUAL WELLNESS VISIT 1 [facility]

## 2023-05-04 LAB — PTH, INTACT AND CALCIUM
Calcium: 9.7 mg/dL (ref 8.6–10.4)
PTH: 39 pg/mL (ref 16–77)

## 2023-05-04 MED ORDER — METHIMAZOLE 5 MG PO TABS
5.0000 mg | ORAL_TABLET | ORAL | 3 refills | Status: DC
Start: 2023-05-04 — End: 2023-11-08

## 2023-08-01 ENCOUNTER — Ambulatory Visit
Admission: RE | Admit: 2023-08-01 | Discharge: 2023-08-01 | Disposition: A | Discharge status: Home / Self Care | Payer: Medicare Other | Source: Ambulatory Visit | Attending: Family Medicine | Admitting: Family Medicine

## 2023-08-01 DIAGNOSIS — M8588 Other specified disorders of bone density and structure, other site: Secondary | ICD-10-CM | POA: Diagnosis not present

## 2023-08-01 DIAGNOSIS — N958 Other specified menopausal and perimenopausal disorders: Secondary | ICD-10-CM | POA: Diagnosis not present

## 2023-08-01 DIAGNOSIS — E2839 Other primary ovarian failure: Secondary | ICD-10-CM

## 2023-08-15 ENCOUNTER — Ambulatory Visit (INDEPENDENT_AMBULATORY_CARE_PROVIDER_SITE_OTHER): Payer: Medicare Other

## 2023-08-15 VITALS — Ht 60.0 in | Wt 145.0 lb

## 2023-08-15 DIAGNOSIS — Z0000 Encounter for general adult medical examination without abnormal findings: Secondary | ICD-10-CM | POA: Diagnosis not present

## 2023-08-15 NOTE — Patient Instructions (Signed)
Ms. Ahlquist , Thank you for taking time to come for your Medicare Wellness Visit. I appreciate your ongoing commitment to your health goals. Please review the following plan we discussed and let me know if I can assist you in the future.   Referrals/Orders/Follow-Ups/Clinician Recommendations: none  This is a list of the screening recommended for you and due dates:  Health Maintenance  Topic Date Due   Zoster (Shingles) Vaccine (1 of 2) Never done   COVID-19 Vaccine (2 - Pfizer risk series) 11/23/2019   Colon Cancer Screening  10/06/2022   Flu Shot  02/16/2023   Mammogram  10/20/2023   Medicare Annual Wellness Visit  08/14/2024   DTaP/Tdap/Td vaccine (3 - Tdap) 02/11/2029   Pneumonia Vaccine  Completed   DEXA scan (bone density measurement)  Completed   Hepatitis C Screening  Completed   HPV Vaccine  Aged Out    Advanced directives: (Copy Requested) Please bring a copy of your health care power of attorney and living will to the office to be added to your chart at your convenience.  Next Medicare Annual Wellness Visit scheduled for next year: Yes 08/15/2024 @ 9:30am televisit

## 2023-08-15 NOTE — Progress Notes (Signed)
Subjective:   Diane Delacruz is a 78 y.o. female who presents for Medicare Annual (Subsequent) preventive examination.  Visit Complete: Virtual I connected with  Diane Delacruz on 08/15/23 by a audio enabled telemedicine application and verified that I am speaking with the correct person using two identifiers.  Patient Location: Home  Provider Location: Home Office  I discussed the limitations of evaluation and management by telemedicine. The patient expressed understanding and agreed to proceed.  Vital Signs: Because this visit was a virtual/telehealth visit, some criteria may be missing or patient reported. Any vitals not documented were not able to be obtained and vitals that have been documented are patient reported.  Patient Medicare AWV questionnaire was completed by the patient on 08/14/23; I have confirmed that all information answered by patient is correct and no changes since this date.  Cardiac Risk Factors include: advanced age (>41men, >29 women);dyslipidemia    Objective:    Today's Vitals   08/15/23 1610  Weight: 145 lb (65.8 kg)  Height: 5' (1.524 m)  PainSc: 0-No pain   Body mass index is 28.32 kg/m.     08/15/2023    9:55 AM 08/11/2022    9:34 AM 06/20/2019   10:30 AM 06/18/2018   11:00 AM 06/12/2017   11:23 AM 05/31/2016    1:23 PM  Advanced Directives  Does Patient Have a Medical Advance Directive? Yes No No Yes No Yes  Type of Estate agent of [place];Living will   Healthcare Power of [place];Living will  Healthcare Power of [place];Living will  Does patient want to make changes to medical advance directive?      No - Patient declined  Copy of Healthcare Power of Attorney in Chart? No - copy requested     No - copy requested  Would patient like information on creating a medical advance directive?  No - Patient declined Yes (MAU/Ambulatory/Procedural Areas - Information given)  No - Patient declined     Current Medications  (verified) Outpatient Encounter Medications as of 08/15/2023  Medication Sig   Calcium-Magnesium-Vitamin D (CALCIUM 1200+D3 PO) Take 1 tablet by mouth daily.   Coenzyme Q10-Vitamin E (QUNOL ULTRA COQ10) 100-150 MG-UNIT CAPS    esomeprazole (NEXIUM) 40 MG capsule TAKE ONE CAPSULE BY MOUTH ONCE DAILY AT NOON   fexofenadine (ALLEGRA) 60 MG tablet Take 180 mg by mouth daily as needed for allergies.   methimazole (TAPAZOLE) 5 MG tablet Take 1 tablet (5 mg total) by mouth as directed. 1 tablet twice a week   mometasone (NASONEX) 50 MCG/ACT nasal spray Place 2 sprays into the nose daily as needed (allergies).   pravastatin (PRAVACHOL) 20 MG tablet Take 1 tablet (20 mg total) by mouth every other day.   No facility-administered encounter medications on file as of 08/15/2023.    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 Dutch Quint   Depression    Diffuse cystic mastopathy    Diverticulosis    Dyspnea 2008   negative echo and MV scan   Esophageal reflux    History of gallstones    Hypoglycemia, unspecified  Irritable bowel syndrome    Lumbago    Palpitations 03/06/09   event monitor   Pure hypercholesterolemia    Stress fracture of foot 03/28/99   left   Symptomatic menopausal or female climacteric states    Unspecified adverse effect of other drug, medicinal and biological substance(995.29)    Unspecified vitamin D deficiency    Past Surgical History:  Procedure Laterality Date   BLADDER SURGERY     tumor removed   CHOLECYSTECTOMY     gallstones- ultrasound 10/03   COLONOSCOPY     Family History   Adopted: Yes  Problem Relation Age of Onset   Hyperlipidemia Mother    Stroke Mother    Arthritis Mother    Macular degeneration Mother    Irritable bowel syndrome Mother    Colon cancer Father 16       died 90   Heart disease Father        MI's   COPD Sister        heavy smoker   Osteopenia Sister    Irritable bowel syndrome Sister    Colon polyps Brother    Diabetes Brother    Colon polyps Maternal Aunt    Breast cancer Paternal Aunt    Diabetes Paternal Uncle    Other Daughter        celiac spruce   Kidney disease Cousin    Rectal cancer Neg Hx    Stomach cancer Neg Hx    Esophageal cancer Neg Hx    Liver cancer Neg Hx    Pancreatic cancer Neg Hx    Prostate cancer Neg Hx    Social History   Socioeconomic History   Marital status: Married    Spouse name: Not on file   Number of children: 1   Years of education: Not on file   Highest education level: 12th grade  Occupational History   Occupation: retired    Associate Professor: unemployed  Tobacco Use   Smoking status: Never   Smokeless tobacco: Never  Vaping Use   Vaping status: Never Used  Substance and Sexual Activity   Alcohol use: Yes    Alcohol/week: 0.0 standard drinks of alcohol    Comment: Rare-wine   Drug use: No   Sexual activity: Never  Other Topics Concern   Not on file  Social History Narrative   Took care 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   Regular exercise   Social Drivers of Health   Financial Resource Strain: Low Risk  (08/15/2023)   Overall Financial Resource Strain (CARDIA)    Difficulty of Paying Living Expenses: Not hard at all  Food Insecurity: No Food Insecurity (08/15/2023)   Hunger Vital Sign    Worried About Running Out of Food in the Last Year: Never true    Ran Out of Food in the Last Year: Never true  Transportation Needs: No Transportation Needs (08/15/2023)   PRAPARE - Administrator, Civil Service (Medical): No    Lack of Transportation (Non-Medical): No   Physical Activity: Insufficiently Active (08/15/2023)   Exercise Vital Sign    Days of Exercise per Week: 4 days    Minutes of Exercise per Session: 30 min  Stress: No Stress Concern Present (08/15/2023)   [employer] of Occupational Health - Occupational Stress Questionnaire    Feeling of Stress : Not at all  Social Connections: Moderately Isolated (08/15/2023)   Social Connection and Isolation Panel [NHANES]    Frequency of  Communication with Friends and Family: Three times a week    Frequency of Social Gatherings with Friends and Family: Once a week    Attends Religious Services: Never    Database administrator or Organizations: No    Attends Engineer, structural: Never    Marital Status: Married    Tobacco Counseling Counseling given: Not Answered  Clinical Intake:  Pre-visit preparation completed: No  Pain : No/denies pain Pain Score: 0-No pain   BMI - recorded: 28.32 Nutritional Status: BMI 25 -29 Overweight Nutritional Risks: None Diabetes: No  How often do you need to have someone help you when you read instructions, pamphlets, or other written materials from your doctor or pharmacy?: 1 - Never  Interpreter Needed?: No  Comments: lives with husband Information entered by :: B.Keasia Dubose,LPN   Activities of Daily Living    08/14/2023   11:20 AM  In your present state of health, do you have any difficulty performing the following activities:  Hearing? 0  Vision? 0  Difficulty concentrating or making decisions? 0  Walking or climbing stairs? 0  Dressing or bathing? 0  Doing errands, shopping? 0  Preparing Food and eating ? N  Using the Toilet? N  In the past six months, have you accidently leaked urine? N  Do you have problems with loss of bowel control? N  Managing your Medications? N  Managing your Finances? N  Housekeeping or managing your Housekeeping? N    Patient Care Team: Tower, Audrie Gallus, MD as PCP - [facility], [place] as  Consulting Physician (Optometry) Leonie Man, Darl Pikes, MD as Consulting Physician (Dermatology)  Indicate any recent Medical Services you may have received from other than [facility] providers in the past year (date may be approximate).     Assessment:   This is a routine wellness examination for [place].  Hearing/Vision screen Hearing Screening - Comments:: Pt says her hearing is good Vision Screening - Comments:: Pt says her vision is good w/glasses Dr Dion Body   Goals Addressed             This Visit's Progress    COMPLETED: DIET - EAT MORE FRUITS AND VEGETABLES   On track    DIET - INCREASE WATER INTAKE       08/15/2023-will continue:I will attempt to drink 6-8 glasses of water daily.      Patient Stated   On track    06/20/2019, I will maintain and continue medications as prescribed.        Depression Screen    08/15/2023    9:44 AM 01/24/2023    8:12 AM 12/22/2022    8:57 AM 08/11/2022    9:33 AM 08/09/2021    9:55 AM 07/01/2020    9:59 AM 06/20/2019   10:31 AM  PHQ 2/9 Scores  PHQ - 2 Score 0 1 0 0 0 0 0  PHQ- 9 Score  6  0   0    Fall Risk    08/14/2023   11:20 AM 01/24/2023    8:12 AM 12/22/2022    8:57 AM 08/11/2022    9:35 AM 08/09/2021    9:55 AM  Fall Risk   Falls in the past year? 0 0 0 0 0  Number falls in past yr: 0 0 0 0   Injury with Fall? 0 0 0 0   Risk for fall due to : No Fall Risks No Fall Risks  No Fall Risks   Follow up Education  provided;Falls prevention discussed Falls evaluation completed Falls evaluation completed;Education provided;Falls prevention discussed Falls prevention discussed;Falls evaluation completed Falls evaluation completed    MEDICARE RISK AT HOME: Medicare Risk at Home Any stairs in or around the home?: (Patient-Rptd) Yes If so, are there any without handrails?: (Patient-Rptd) Yes Home free of loose throw rugs in walkways, pet beds, electrical cords, etc?: (Patient-Rptd) No Adequate lighting in your home to reduce risk of falls?:  (Patient-Rptd) Yes Life alert?: (Patient-Rptd) No Use of a cane, walker or w/c?: (Patient-Rptd) No Grab bars in the bathroom?: (Patient-Rptd) No Shower chair or bench in shower?: (Patient-Rptd) No Elevated toilet seat or a handicapped toilet?: (Patient-Rptd) No  TIMED UP AND GO:  Was the test performed?  No    Cognitive Function:    06/20/2019   10:33 AM 06/18/2018   10:48 AM 06/12/2017   11:23 AM 05/31/2016    1:26 PM  MMSE - Mini Mental State Exam  Orientation to time 5 5 5 5   Orientation to Place 5 5 5 5   Registration 3 3 3 3   Attention/ Calculation 5 0 0 0  Recall 3 3 3 3   Language- name 2 objects  0 0 0  Language- repeat 1 1 1 1   Language- follow 3 step command  3 3 3   Language- read & follow direction  0 0 0  Write a sentence  0 0 0  Copy design  0 0 0  Total score  20 20 20         08/15/2023   10:00 AM 08/11/2022    9:38 AM  6CIT Screen  What Year? 0 points 0 points  What month? 0 points 0 points  What time? 0 points 0 points  Count back from 20 0 points 0 points  Months in reverse 0 points 0 points  Repeat phrase 0 points 0 points  Total Score 0 points 0 points    Immunizations Immunization History  Administered Date(s) Administered   Fluad Quad(high Dose 65+) 06/25/2019, 04/21/2020, 08/09/2021   Influenza Split 04/26/2011   Influenza,inj,Quad PF,6+ Mos 04/25/2014, 04/27/2015, 05/02/2017, 04/13/2018   Influenza-Unspecified 04/27/2016   PFIZER(Purple Top)SARS-COV-2 Vaccination 11/02/2019   Pneumococcal Conjugate-13 04/25/2014   Pneumococcal Polysaccharide-23 11/02/2011   Td 11/11/2002, 02/12/2019    TDAP status: Up to date  Flu Vaccine status: Declined, Education has been provided regarding the importance of this vaccine but patient still declined. Advised may receive this vaccine at local pharmacy or Health Dept. Aware to provide a copy of the vaccination record if obtained from local pharmacy or Health Dept. Verbalized acceptance and  understanding.  Pneumococcal vaccine status: Up to date  Covid-19 vaccine status: Completed vaccines  Qualifies for Shingles Vaccine? Yes   Zostavax completed No   Shingrix Completed?: No.    Education has been provided regarding the importance of this vaccine. Patient has been advised to call insurance company to determine out of pocket expense if they have not yet received this vaccine. Advised may also receive vaccine at local pharmacy or Health Dept. Verbalized acceptance and understanding.  Screening Tests Health Maintenance  Topic Date Due   Zoster Vaccines- Shingrix (1 of 2) Never done   COVID-19 Vaccine (2 - Pfizer risk series) 11/23/2019   Colonoscopy  10/06/2022   INFLUENZA VACCINE  10/16/2023 (Originally 02/16/2023)   MAMMOGRAM  10/20/2023   Medicare Annual Wellness (AWV)  08/14/2024   DTaP/Tdap/Td (3 - Tdap) 02/11/2029   Pneumonia Vaccine 63+ Years old  Completed   DEXA SCAN  Completed   Hepatitis C Screening  Completed   HPV VACCINES  Aged Out    Health Maintenance  Health Maintenance Due  Topic Date Due   Zoster Vaccines- Shingrix (1 of 2) Never done   COVID-19 Vaccine (2 - Pfizer risk series) 11/23/2019   Colonoscopy  10/06/2022    Colorectal cancer screening: No longer required.   Mammogram status: No longer required due to age.  Bone Density status: Completed 07/2023. Results reflect: Bone density results: OSTEOPOROSIS. Repeat every 2 years.  Lung Cancer Screening: (Low Dose CT Chest recommended if Age 3-80 years, 20 pack-year currently smoking OR have quit w/in 15years.) does not qualify.   Lung Cancer Screening Referral: no  Additional Screening:  Hepatitis C Screening: does not qualify; Completed 05/24/2016  Vision Screening: Recommended annual ophthalmology exams for early detection of glaucoma and other disorders of the eye. Is the patient up to date with their annual eye exam?  Yes  Who is the provider or what is the name of the office in which  the patient attends annual eye exams? Dr Dion Body If pt is not established with a provider, would they like to be referred to a provider to establish care? No .   Dental Screening: Recommended annual dental exams for proper oral hygiene  Diabetic Foot Exam: n/a  Community Resource Referral / Chronic Care Management: CRR required this visit?  No   CCM required this visit?  No    Plan:     I have personally reviewed and noted the following in the patient's chart:   Medical and social history Use of alcohol, tobacco or illicit drugs  Current medications and supplements including opioid prescriptions. Patient is not currently taking opioid prescriptions. Functional ability and status Nutritional status Physical activity Advanced directives List of other physicians Hospitalizations, surgeries, and ER visits in previous 12 months Vitals Screenings to include cognitive, depression, and falls Referrals and appointments  In addition, I have reviewed and discussed with patient certain preventive protocols, quality metrics, and best practice recommendations. A written personalized care plan for preventive services as well as general preventive health recommendations were provided to patient.    Sue Lush, LPN   1/32/4401   After Visit Summary: (MyChart) Due to this being a telephonic visit, the after visit summary with patients personalized plan was offered to patient via MyChart   Nurse Notes: The patient states she is doing well and has no concerns or questions at this time.

## 2023-08-17 ENCOUNTER — Ambulatory Visit (INDEPENDENT_AMBULATORY_CARE_PROVIDER_SITE_OTHER): Payer: Medicare Other | Admitting: Family Medicine

## 2023-08-17 ENCOUNTER — Encounter: Payer: Self-pay | Admitting: Family Medicine

## 2023-08-17 VITALS — BP 122/62 | HR 73 | Temp 98.1°F | Ht 61.5 in | Wt 141.2 lb

## 2023-08-17 DIAGNOSIS — E7800 Pure hypercholesterolemia, unspecified: Secondary | ICD-10-CM

## 2023-08-17 DIAGNOSIS — Z79899 Other long term (current) drug therapy: Secondary | ICD-10-CM

## 2023-08-17 DIAGNOSIS — M810 Age-related osteoporosis without current pathological fracture: Secondary | ICD-10-CM | POA: Diagnosis not present

## 2023-08-17 DIAGNOSIS — K219 Gastro-esophageal reflux disease without esophagitis: Secondary | ICD-10-CM

## 2023-08-17 DIAGNOSIS — E559 Vitamin D deficiency, unspecified: Secondary | ICD-10-CM | POA: Diagnosis not present

## 2023-08-17 DIAGNOSIS — E0590 Thyrotoxicosis, unspecified without thyrotoxic crisis or storm: Secondary | ICD-10-CM | POA: Diagnosis not present

## 2023-08-17 DIAGNOSIS — Z860101 Personal history of adenomatous and serrated colon polyps: Secondary | ICD-10-CM

## 2023-08-17 DIAGNOSIS — Z0000 Encounter for general adult medical examination without abnormal findings: Secondary | ICD-10-CM | POA: Diagnosis not present

## 2023-08-17 DIAGNOSIS — R7309 Other abnormal glucose: Secondary | ICD-10-CM | POA: Diagnosis not present

## 2023-08-17 DIAGNOSIS — E538 Deficiency of other specified B group vitamins: Secondary | ICD-10-CM | POA: Diagnosis not present

## 2023-08-17 DIAGNOSIS — Z780 Asymptomatic menopausal state: Secondary | ICD-10-CM

## 2023-08-17 DIAGNOSIS — E0500 Thyrotoxicosis with diffuse goiter without thyrotoxic crisis or storm: Secondary | ICD-10-CM

## 2023-08-17 DIAGNOSIS — Z800 Family history of malignant neoplasm of digestive organs: Secondary | ICD-10-CM

## 2023-08-17 LAB — COMPREHENSIVE METABOLIC PANEL
ALT: 10 U/L (ref 0–35)
AST: 20 U/L (ref 0–37)
Albumin: 4.4 g/dL (ref 3.5–5.2)
Alkaline Phosphatase: 100 U/L (ref 39–117)
BUN: 11 mg/dL (ref 6–23)
CO2: 27 meq/L (ref 19–32)
Calcium: 9.7 mg/dL (ref 8.4–10.5)
Chloride: 103 meq/L (ref 96–112)
Creatinine, Ser: 1.02 mg/dL (ref 0.40–1.20)
GFR: 53.19 mL/min — ABNORMAL LOW (ref >60.00)
Glucose, Bld: 90 mg/dL (ref 70–99)
Potassium: 4.2 meq/L (ref 3.5–5.1)
Sodium: 139 meq/L (ref 135–145)
Total Bilirubin: 0.6 mg/dL (ref 0.2–1.2)
Total Protein: 6.6 g/dL (ref 6.0–8.3)

## 2023-08-17 LAB — LIPID PANEL
Cholesterol: 214 mg/dL — ABNORMAL HIGH (ref 0–200)
HDL: 86.2 mg/dL (ref >39.00)
LDL Cholesterol: 113 mg/dL — ABNORMAL HIGH (ref 0–99)
NonHDL: 127.82
Total CHOL/HDL Ratio: 2
Triglycerides: 72 mg/dL (ref 0.0–149.0)
VLDL: 14.4 mg/dL (ref 0.0–40.0)

## 2023-08-17 LAB — CBC WITH DIFFERENTIAL/PLATELET
Basophils Absolute: 0.1 10*3/uL (ref 0.0–0.1)
Basophils Relative: 0.9 % (ref 0.0–3.0)
Eosinophils Absolute: 0.4 10*3/uL (ref 0.0–0.7)
Eosinophils Relative: 6.3 % — ABNORMAL HIGH (ref 0.0–5.0)
HCT: 41 % (ref 36.0–46.0)
Hemoglobin: 13.6 g/dL (ref 12.0–15.0)
Lymphocytes Relative: 43.4 % (ref 12.0–46.0)
Lymphs Abs: 2.7 10*3/uL (ref 0.7–4.0)
MCHC: 33.3 g/dL (ref 30.0–36.0)
MCV: 94.9 fL (ref 78.0–100.0)
Monocytes Absolute: 0.4 10*3/uL (ref 0.1–1.0)
Monocytes Relative: 6.8 % (ref 3.0–12.0)
Neutro Abs: 2.6 10*3/uL (ref 1.4–7.7)
Neutrophils Relative %: 42.6 % — ABNORMAL LOW (ref 43.0–77.0)
Platelets: 306 10*3/uL (ref 150.0–400.0)
RBC: 4.32 Mil/uL (ref 3.87–5.11)
RDW: 12.8 % (ref 11.5–15.5)
WBC: 6.1 10*3/uL (ref 4.0–10.5)

## 2023-08-17 LAB — HEMOGLOBIN A1C: Hgb A1c MFr Bld: 5.8 % (ref 4.6–6.5)

## 2023-08-17 LAB — VITAMIN D 25 HYDROXY (VIT D DEFICIENCY, FRACTURES): VITD: 47.3 ng/mL (ref 30.00–100.00)

## 2023-08-17 LAB — VITAMIN B12: Vitamin B-12: 240 pg/mL (ref 211–911)

## 2023-08-17 MED ORDER — ALENDRONATE SODIUM 70 MG PO TABS: 70.0000 mg | ORAL_TABLET | ORAL | 11 refills | Status: DC

## 2023-08-17 MED ORDER — ESOMEPRAZOLE MAGNESIUM 40 MG PO CPDR: 40.0000 mg | DELAYED_RELEASE_CAPSULE | Freq: Every morning | ORAL | 3 refills | Status: AC

## 2023-08-17 NOTE — Assessment & Plan Note (Signed)
Under care of endoctinology Lab Results  Component Value Date   TSH 2.48 05/03/2023   No clinical changes Continues tapazole 5 mg daily  Reviewed last note

## 2023-08-17 NOTE — Assessment & Plan Note (Signed)
Dexa 07/2023 No new falls or fracture  D level today  Encouraged strength training exercise  Discussed fall prevention, supplements and exercise for bone density   Open to starting alendronate weekly /discussed possible side eff No dental concerns  Sent to pharm Handout given  Instructed to call if side effects If well tol will plan on 5 y

## 2023-08-17 NOTE — Progress Notes (Signed)
Subjective:    Patient ID: Diane Delacruz, female    DOB: Feb 12, 1946, 78 y.o.   MRN: [id number]  HPI  Here for health maintenance exam and to review chronic medical problems   Wt Readings from Last 3 Encounters:  08/17/23 141 lb 4 oz (64.1 kg)  08/15/23 145 lb (65.8 kg)  05/03/23 145 lb 12.8 oz (66.1 kg)   26.26 kg/m  Vitals:   08/17/23 0950  BP: 122/62  Pulse: 73  Temp: 98.1 F (36.7 C)  SpO2: 99%    Immunization History  Administered Date(s) Administered   Fluad Quad(high Dose 65+) 06/25/2019, 04/21/2020, 08/09/2021   Influenza Split 04/26/2011   Influenza,inj,Quad PF,6+ Mos 04/25/2014, 04/27/2015, 05/02/2017, 04/13/2018   Influenza-Unspecified 04/27/2016   PFIZER(Purple Top)SARS-COV-2 Vaccination 11/02/2019   Pneumococcal Conjugate-13 04/25/2014   Pneumococcal Polysaccharide-23 11/02/2011   Td 11/11/2002, 02/12/2019    Health Maintenance Due  Topic Date Due   Colonoscopy  10/06/2022   Declines flu shot   Shingrix -declines    Mammogram 10/2022  Self breast exam  Gyn health-no issues or complaints    Colon cancer screening  Was due for colonoscopy 09/2022 but has not done it  Fam history of colon cancer in father at 31  Bone health  Dexa 07/2023  Unsure if open to treatment  Has dentures -no dental concerns  Falls- none  Fractures-none  Supplements ca with mag and D  Last vitamin D Lab Results  Component Value Date   VD25OH 47.30 08/17/2023    Exercise  Walking regularly -now up to 25 minutes  Some exercises when standing in kitchen - occational      Mood    08/17/2023    9:55 AM 08/15/2023    9:44 AM 01/24/2023    8:12 AM 12/22/2022    8:57 AM 08/11/2022    9:33 AM  Depression screen PHQ 2/9  Decreased Interest 0 0 0 0 0  Down, Depressed, Hopeless 0 0 1 0 0  PHQ - 2 Score 0 0 1 0 0  Altered sleeping 0  1  0  Tired, decreased energy 0  1  0  Change in appetite 0  3  0  Feeling bad or failure about yourself  0  0  0  Trouble  concentrating 0  0  0  Moving slowly or fidgety/restless 0  0  0  Suicidal thoughts 0  0  0  PHQ-9 Score 0  6  0  Difficult doing work/chores Not difficult at all  Not difficult at all  Not difficult at all    GERD  Continues nexium 40 mg daily  Has not been able to wean   History of B12 def  Lab Results  Component Value Date   VITAMINB12 240 08/17/2023  Due for labs   Hyperthyroidism from [facility] dz  Lab Results  Component Value Date   TSH 2.48 05/03/2023   Tapazole 5 mg daily  From endocrinology   Glucose Lab Results  Component Value Date   HGBA1C 5.8 08/17/2023   HGBA1C 5.8 08/05/2022   HGBA1C 5.7 08/09/2021   Sugar is a problem/ she craves it  Not as bad as she used to be     Hyperlipidemia Lab Results  Component Value Date   CHOL 214 (H) 08/17/2023   HDL 86.20 08/17/2023   LDLCALC 113 (H) 08/17/2023   LDLDIRECT 170.5 04/30/2012   TRIG 72.0 08/17/2023   CHOLHDL 2 08/17/2023   Pravastatin 20 mg every  other day  Does not eat a lot of sat and trans fat  Will do labs today   Patient Active Problem List   Diagnosis Date Noted   Hx of adenomatous colonic polyps 08/11/2022   Graves disease 12/24/2021   Hyperthyroidism 12/24/2021   Current use of proton pump inhibitor 08/09/2021   Vitamin B12 deficiency 08/09/2021   Elevated glucose level 06/21/2017   Estrogen deficiency 04/27/2015   Routine general medical examination at a health care facility 04/19/2015   Insomnia 02/21/2011   Family history of colon cancer 11/01/2010   Hyperlipidemia 11/01/2010   Vitamin D deficiency 10/10/2008   IRRITABLE BOWEL SYNDROME 10/15/2007   VARICOSE VEINS, LOWER EXTREMITIES 08/15/2007   Osteoporosis 05/14/2007   ALLERGIC RHINITIS, SEASONAL 05/11/2007   GERD 05/11/2007   FIBROCYSTIC BREAST DISEASE 05/11/2007   POSTMENOPAUSAL STATUS 05/11/2007   Past Medical History:  Diagnosis Date   Allergic rhinitis due to pollen    Allergy 0000   Anxiety    past hx    Asymptomatic varicose veins    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 Dutch Quint   Depression    Diffuse cystic mastopathy    Diverticulosis    Dyspnea 2008   negative echo and MV scan   Esophageal reflux    History of gallstones    Hypoglycemia, unspecified    Irritable bowel syndrome    Lumbago    Palpitations 03/06/2009   event monitor   Pure hypercholesterolemia    Stress fracture of foot 03/28/1999   left   Symptomatic menopausal or female climacteric states    Thyroid disease 07/2021   Unspecified adverse effect of other drug, medicinal and biological substance(995.29)    Unspecified vitamin D deficiency    Past Surgical History:  Procedure Laterality Date   BLADDER SURGERY     tumor removed   CHOLECYSTECTOMY  2000   gallstones- ultrasound 10/03   COLONOSCOPY     Social History   Tobacco Use   Smoking status: Never   Smokeless tobacco: Never  Vaping Use   Vaping status: Never Used  Substance Use Topics   Alcohol use: Not Currently    Comment: Rare-wine   Drug use: No   Family History  Adopted: Yes  Problem Relation Age of Onset   Hyperlipidemia Mother    Stroke Mother    Arthritis Mother    Macular degeneration Mother    Irritable bowel syndrome Mother    Colon cancer Father 51       died 46   Heart disease Father        MI's   COPD Sister        heavy smoker   Osteopenia Sister    Irritable bowel syndrome Sister    Colon polyps Brother    Diabetes Brother    Colon polyps Maternal Aunt    Breast cancer Paternal Aunt    Diabetes Paternal Uncle    Other Daughter        celiac spruce   Kidney disease Cousin    Rectal cancer Neg Hx    Stomach cancer Neg Hx    Esophageal cancer Neg Hx    Liver cancer Neg Hx    Pancreatic cancer Neg Hx    Prostate cancer Neg Hx    Allergies  Allergen Reactions   Amoxicillin     Rash and diarrhea  Codeine      REACTION: unknown reaction   Flexeril [Cyclobenzaprine]     sedation   Klonopin [Clonazepam]     Dizziness , fatigue   Nsaids     REACTION: diarrhea   Omeprazole     REACTION: did not work for her   Pseudoephedrine     REACTION: jittery   Tessalon [Benzonatate] Rash    Rash   Current Outpatient Medications on File Prior to Visit  Medication Sig Dispense Refill   Calcium-Magnesium-Vitamin D (CALCIUM 1200+D3 PO) Take 1 tablet by mouth daily.     Coenzyme Q10-Vitamin E (QUNOL ULTRA COQ10) 100-150 MG-UNIT CAPS      fexofenadine (ALLEGRA) 60 MG tablet Take 180 mg by mouth daily as needed for allergies.     methimazole (TAPAZOLE) 5 MG tablet Take 1 tablet (5 mg total) by mouth as directed. 1 tablet twice a week 24 tablet 3   mometasone (NASONEX) 50 MCG/ACT nasal spray Place 2 sprays into the nose daily as needed (allergies). 17 g 11   pravastatin (PRAVACHOL) 20 MG tablet Take 1 tablet (20 mg total) by mouth every other day. 45 tablet 3   No current facility-administered medications on file prior to visit.    Review of Systems  Constitutional:  Negative for activity change, appetite change, fatigue, fever and unexpected weight change.  HENT:  Negative for congestion, ear pain, rhinorrhea, sinus pressure and sore throat.   Eyes:  Negative for pain, redness and visual disturbance.  Respiratory:  Negative for cough, shortness of breath and wheezing.   Cardiovascular:  Negative for chest pain and palpitations.  Gastrointestinal:  Negative for abdominal pain, blood in stool, constipation and diarrhea.  Endocrine: Negative for polydipsia and polyuria.  Genitourinary:  Negative for dysuria, frequency and urgency.  Musculoskeletal:  Negative for arthralgias, back pain and myalgias.  Skin:  Negative for pallor and rash.  Allergic/Immunologic: Negative for environmental allergies.  Neurological:  Negative for dizziness, syncope and headaches.  Hematological:  Negative for adenopathy. Does not  bruise/bleed easily.  Psychiatric/Behavioral:  Negative for decreased concentration and dysphoric mood. The patient is not nervous/anxious.        Objective:   Physical Exam Constitutional:      General: She is not in acute distress.    Appearance: Normal appearance. She is well-developed and normal weight. She is not ill-appearing or diaphoretic.  HENT:     Head: Normocephalic and atraumatic.     Right Ear: Tympanic membrane, ear canal and external ear normal.     Left Ear: Tympanic membrane, ear canal and external ear normal.     Nose: Nose normal. No congestion.     Mouth/Throat:     Mouth: Mucous membranes are moist.     Pharynx: Oropharynx is clear. No posterior oropharyngeal erythema.  Eyes:     General: No scleral icterus.    Extraocular Movements: Extraocular movements intact.     Conjunctiva/sclera: Conjunctivae normal.     Pupils: Pupils are equal, round, and reactive to light.  Neck:     Thyroid: No thyromegaly.     Vascular: No carotid bruit or JVD.  Cardiovascular:     Rate and Rhythm: Normal rate and regular rhythm.     Pulses: Normal pulses.     Heart sounds: Normal heart sounds.     No gallop.  Pulmonary:     Effort: Pulmonary effort is normal. No respiratory distress.     Breath sounds: Normal breath sounds. No wheezing.  Comments: Good air exch Chest:     Chest wall: No tenderness.  Abdominal:     General: Bowel sounds are normal. There is no distension or abdominal bruit.     Palpations: Abdomen is soft. There is no mass.     Tenderness: There is no abdominal tenderness.     Hernia: No hernia is present.  Genitourinary:    Comments: Breast exam: No mass, nodules, thickening, tenderness, bulging, retraction, inflamation, nipple discharge or skin changes noted.  No axillary or clavicular LA.     Musculoskeletal:        General: No tenderness. Normal range of motion.     Cervical back: Normal range of motion and neck supple. No rigidity. No muscular  tenderness.     Right lower leg: No edema.     Left lower leg: No edema.     Comments: No kyphosis   Lymphadenopathy:     Cervical: No cervical adenopathy.  Skin:    General: Skin is warm and dry.     Coloration: Skin is not pale.     Findings: No erythema or rash.     Comments: Solar lentigines diffusely   Neurological:     Mental Status: She is alert. Mental status is at baseline.     Cranial Nerves: No cranial nerve deficit.     Motor: No abnormal muscle tone.     Coordination: Coordination normal.     Gait: Gait normal.     Deep Tendon Reflexes: Reflexes are normal and symmetric. Reflexes normal.  Psychiatric:        Mood and Affect: Mood normal.        Cognition and Memory: Cognition and memory normal.           Assessment & Plan:   Problem List Items Addressed This Visit       Digestive   GERD   Continues nexium 40 mg  Unable to tolerate weaning   Encouraged to avoid triggers       Relevant Medications   esomeprazole (NEXIUM) 40 MG capsule     Endocrine   Hyperthyroidism   See a/p for Grave's dz No clinical changes Tapazole Endo care       Graves disease   Under care of endoctinology Lab Results  Component Value Date   TSH 2.48 05/03/2023   No clinical changes Continues tapazole 5 mg daily  Reviewed last note         Musculoskeletal and Integument   Osteoporosis   Dexa 07/2023 No new falls or fracture  D level today  Encouraged strength training exercise  Discussed fall prevention, supplements and exercise for bone density   Open to starting alendronate weekly /discussed possible side eff No dental concerns  Sent to [facility] given  Instructed to call if side effects If well tol will plan on 5 y        Relevant Medications   alendronate (FOSAMAX) 70 MG tablet     Other   Vitamin D deficiency   D level today Supplemented  Discussed importance to bone and overall health      Relevant Orders   VITAMIN D 25 Hydroxy  (Vit-D Deficiency, Fractures) (Completed)   Vitamin B12 deficiency   B12 level today  On ppi  Oral supplementation       Relevant Orders   Vitamin B12 (Completed)   Routine general medical examination at a health care facility - Primary   Reviewed health habits including diet  and exercise and skin cancer prevention Reviewed appropriate screening tests for age  Also reviewed health mt list, fam hx and immunization status , as well as social and family history   See HPI Labs reviewed and ordered Health Maintenance  Topic Date Due   Colon Cancer Screening  10/06/2022   Flu Shot  10/16/2023*   COVID-19 Vaccine (2 - Pfizer risk series) 09/01/2024*   Zoster (Shingles) Vaccine (1 of 2) 11/14/2024*   Mammogram  10/20/2023   Medicare Annual Wellness Visit  08/14/2024   DTaP/Tdap/Td vaccine (3 - Tdap) 02/11/2029   Pneumonia Vaccine  Completed   DEXA scan (bone density measurement)  Completed   Hepatitis C Screening  Completed   HPV Vaccine  Aged Out  *Topic was postponed. The date shown is not the original due date.   Declines flu shot  Declines shingrix vaccine  GI ref done for colonoscopy  Reviewed dexa  Discussed fall prevention, supplements and exercise for bone density  PHQ 0      Relevant Orders   CBC with Differential/Platelet (Completed)   Comprehensive metabolic panel (Completed)   Hyperlipidemia   Disc goals for lipids and reasons to control them Rev last labs with pt Rev low sat fat diet in detail  Lab today  Pravastatin 20 mg every other day      Relevant Orders   Comprehensive metabolic panel (Completed)   Lipid Panel (Completed)   Hx of adenomatous colonic polyps   Due for 5 y colonoscopy Ref done  Pt will call to schedule       Relevant Orders   Ambulatory referral to Gastroenterology   Family history of colon cancer   Referred for colonoscopy  Due for 5 year screen      Relevant Orders   Ambulatory referral to Gastroenterology   Elevated  glucose level   A1c today  disc imp of low glycemic diet and wt loss to prevent DM2        Relevant Orders   Hemoglobin A1c (Completed)   Current use of proton pump inhibitor   Continues nexium 40 mg daily  Has not been able to wean  B12 level today   Encouraged D supplementation   Watching bone density

## 2023-08-17 NOTE — Assessment & Plan Note (Signed)
Disc goals for lipids and reasons to control them Rev last labs with pt Rev low sat fat diet in detail  Lab today  Pravastatin 20 mg every other day

## 2023-08-17 NOTE — Assessment & Plan Note (Signed)
D level today Supplemented  Discussed importance to bone and overall health

## 2023-08-17 NOTE — Assessment & Plan Note (Signed)
See a/p for Grave's dz No clinical changes Tapazole Endo care

## 2023-08-17 NOTE — Patient Instructions (Addendum)
Call to schedule your colonoscopy   [facility]  [phone]   Stay active  Add some strength training to your routine, this is important for bone and brain health and can reduce your risk of falls and help your body use insulin properly and regulate weight  Light weights, exercise bands , and internet videos are a good way to start  Yoga (chair or regular), machines , floor exercises or a gym with machines are also good options    Start alendronate weekly  If any side effects stop it and let me know  Goal if tolerated is 5 years   To prevent diabetes Try to get most of your carbohydrates from produce (with the exception of white potatoes) and whole grains Eat less bread/pasta/rice/snack foods/cereals/sweets and other items from the middle of the grocery store (processed carbs)

## 2023-08-17 NOTE — Assessment & Plan Note (Signed)
A1c today  disc imp of low glycemic diet and wt loss to prevent DM2

## 2023-08-17 NOTE — Assessment & Plan Note (Signed)
Referred for colonoscopy  Due for 5 year screen

## 2023-08-17 NOTE — Assessment & Plan Note (Signed)
B12 level today  On ppi  Oral supplementation

## 2023-08-17 NOTE — Assessment & Plan Note (Signed)
Reviewed health habits including diet and exercise and skin cancer prevention Reviewed appropriate screening tests for age  Also reviewed health mt list, fam hx and immunization status , as well as social and family history   See HPI Labs reviewed and ordered Health Maintenance  Topic Date Due   Colon Cancer Screening  10/06/2022   Flu Shot  10/16/2023*   COVID-19 Vaccine (2 - Pfizer risk series) 09/01/2024*   Zoster (Shingles) Vaccine (1 of 2) 11/14/2024*   Mammogram  10/20/2023   Medicare Annual Wellness Visit  08/14/2024   DTaP/Tdap/Td vaccine (3 - Tdap) 02/11/2029   Pneumonia Vaccine  Completed   DEXA scan (bone density measurement)  Completed   Hepatitis C Screening  Completed   HPV Vaccine  Aged Out  *Topic was postponed. The date shown is not the original due date.   Declines flu shot  Declines shingrix vaccine  GI ref done for colonoscopy  Reviewed dexa  Discussed fall prevention, supplements and exercise for bone density  PHQ 0

## 2023-08-17 NOTE — Assessment & Plan Note (Signed)
Continues nexium 40 mg  Unable to tolerate weaning   Encouraged to avoid triggers

## 2023-08-17 NOTE — Assessment & Plan Note (Signed)
Continues nexium 40 mg daily  Has not been able to wean  B12 level today   Encouraged D supplementation   Watching bone density

## 2023-08-17 NOTE — Assessment & Plan Note (Signed)
Due for 5 y colonoscopy Ref done  Pt will call to schedule

## 2023-08-18 ENCOUNTER — Other Ambulatory Visit: Payer: Self-pay | Admitting: *Deleted

## 2023-09-13 ENCOUNTER — Other Ambulatory Visit: Payer: Self-pay | Admitting: Family Medicine

## 2023-09-29 IMAGING — US US THYROID
1 series · 13 of 25 positions shown · non-contrast
Comparison: None.

CLINICAL DATA: Hyperthyroidism

EXAM:
THYROID ULTRASOUND
TECHNIQUE: Ultrasound examination of the thyroid gland and adjacent soft
tissues was performed.

[Series 1: us thyroid · 0.04mm/px · 13 of 51 slices shown]
[im 1/51]
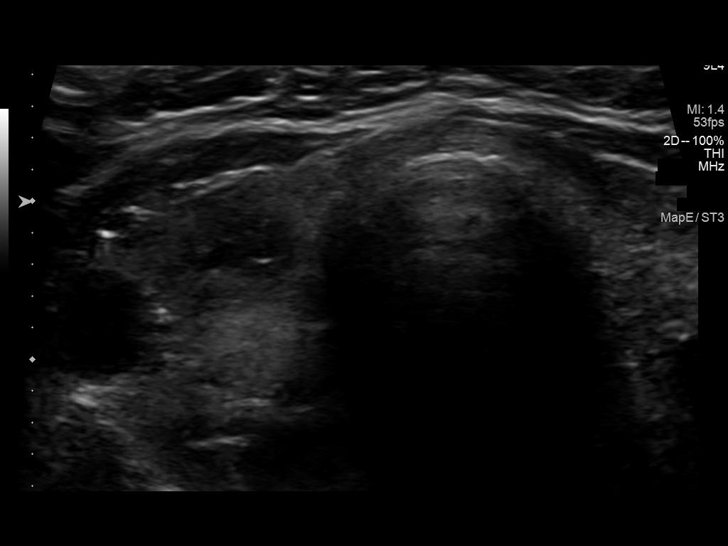
[im 5/51]
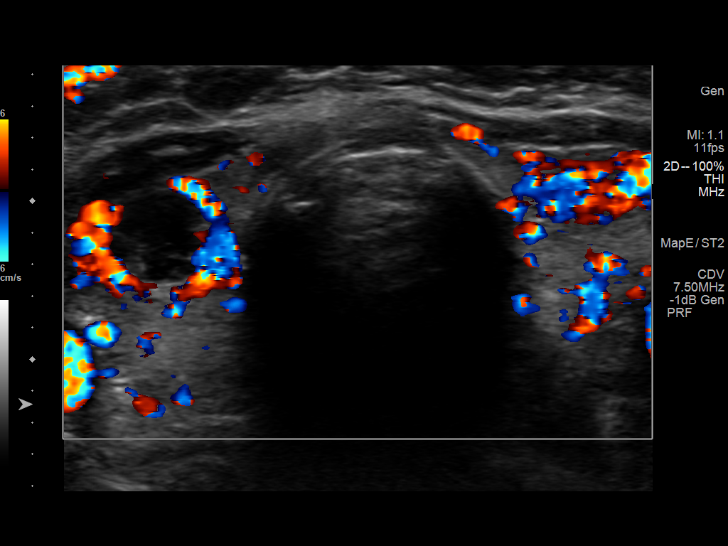
[im 9/51]
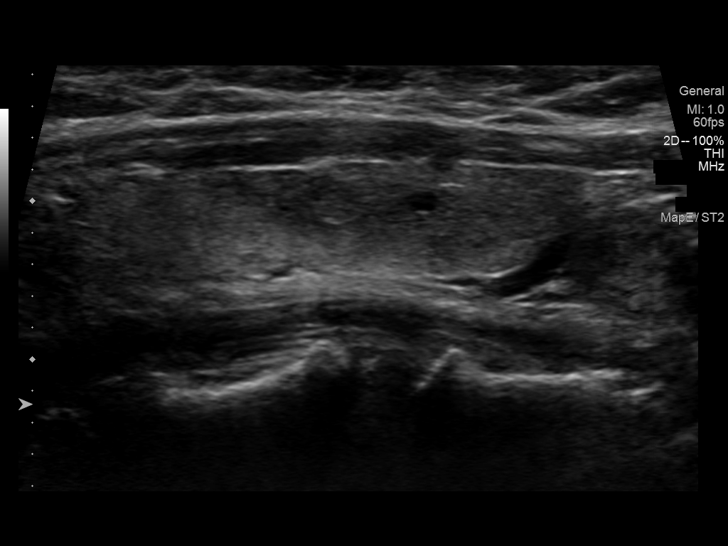
[im 13/51]
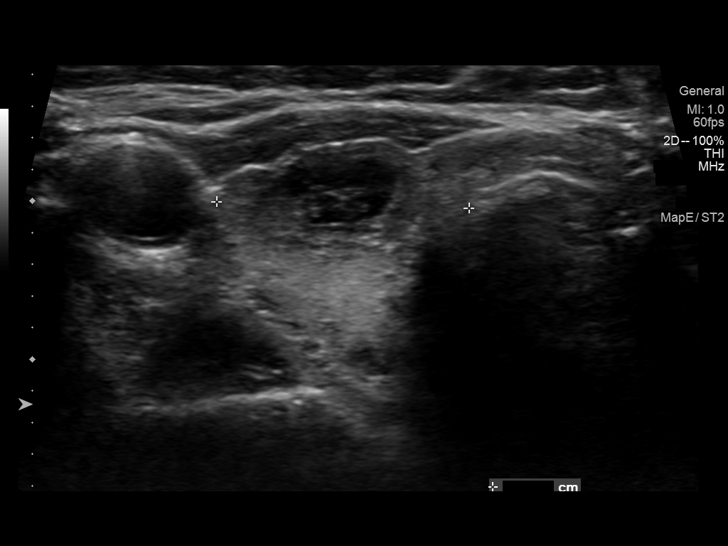
[im 17/51]
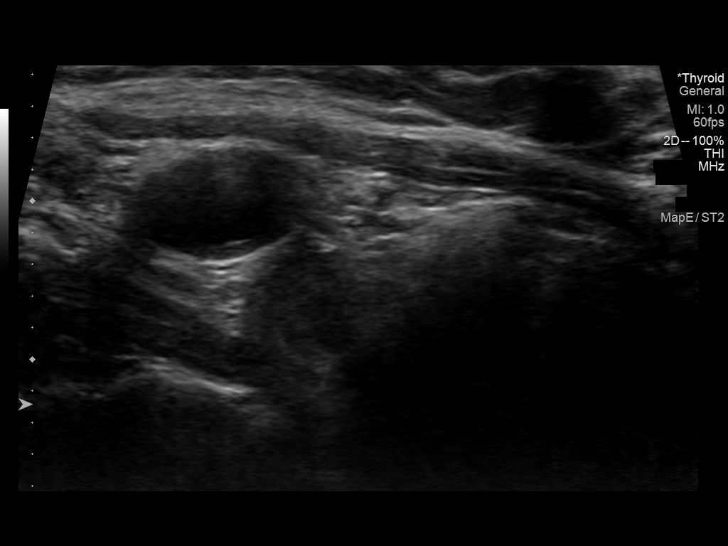
[im 21/51]
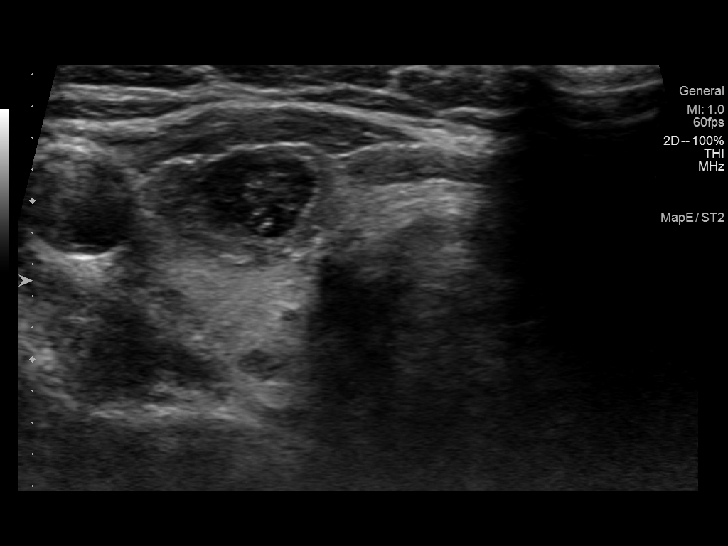
[im 26/51]
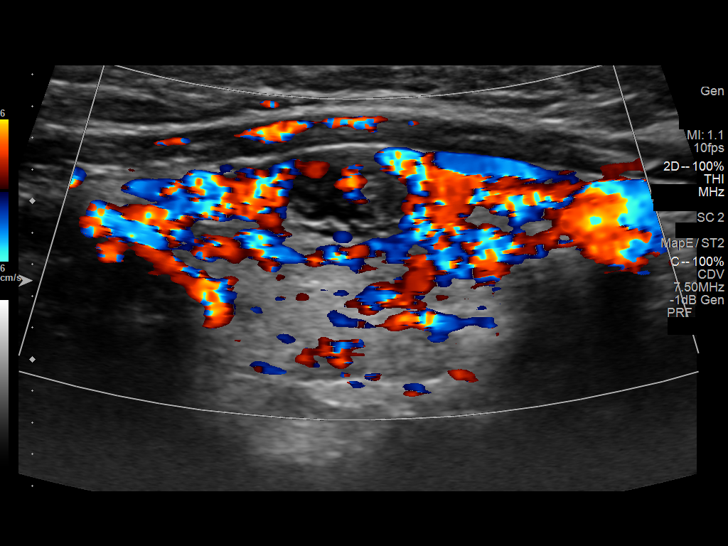
[im 30/51]
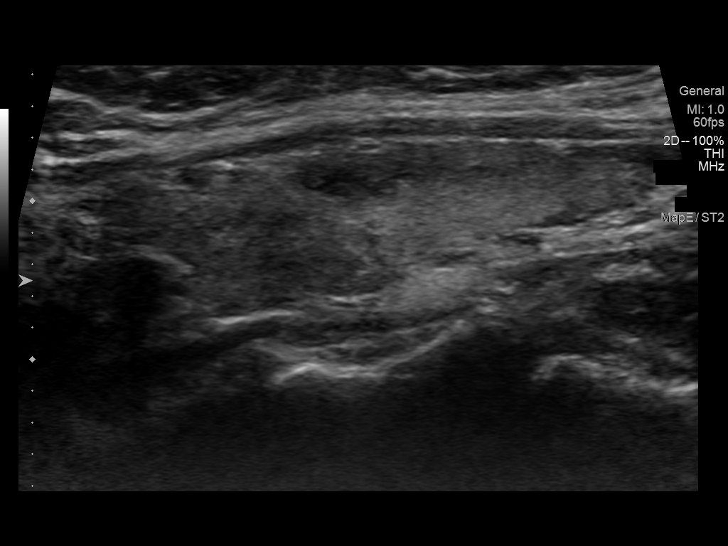
[im 34/51]
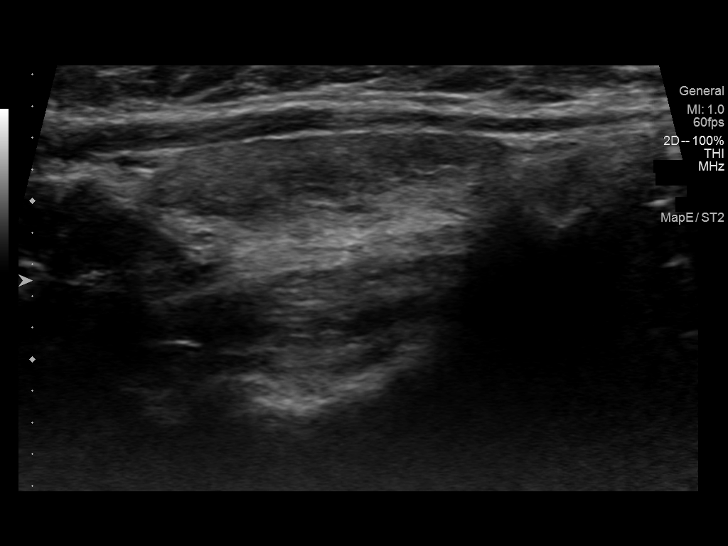
[im 38/51]
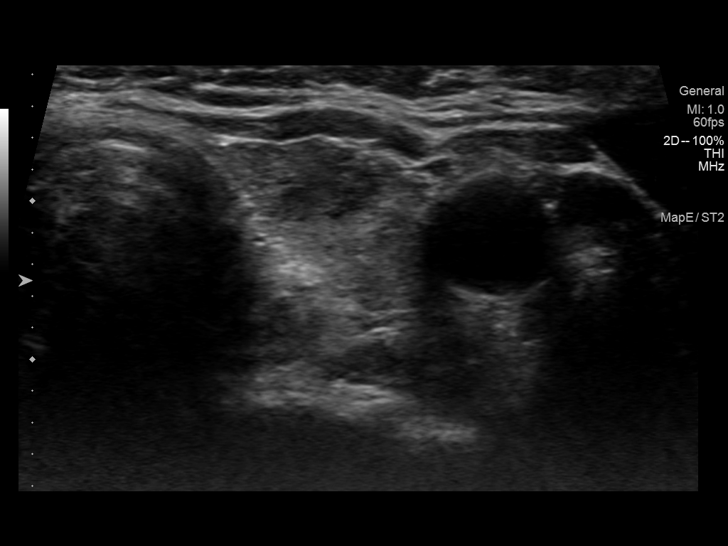
[im 42/51]
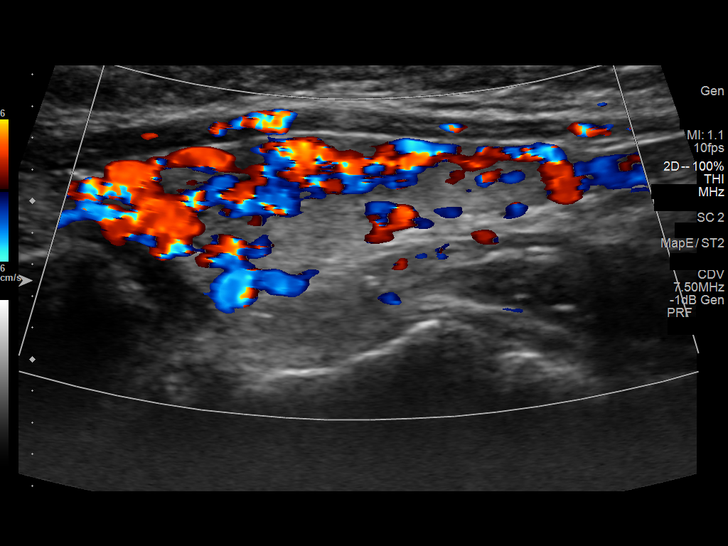
[im 46/51]
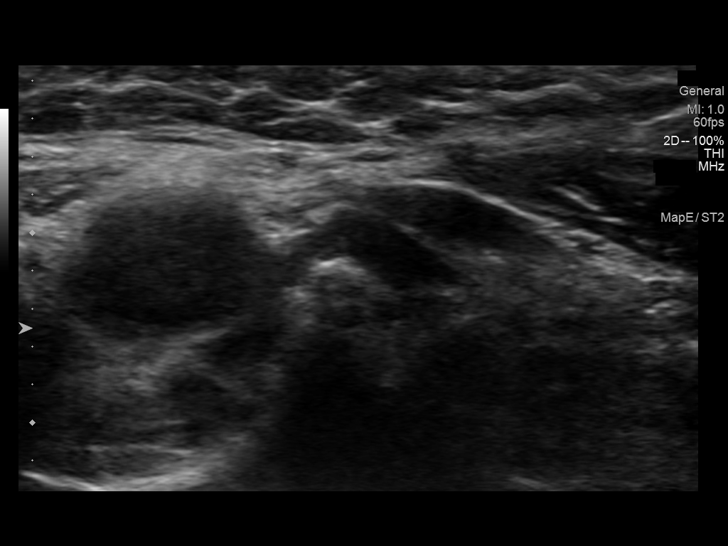
[im 51/51]
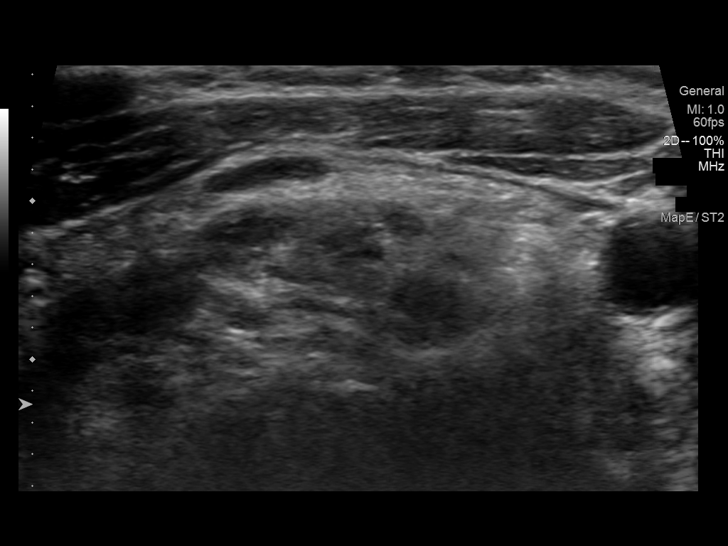

[13 of 25 positions shown; findings below may reference images not displayed]

FINDINGS: Parenchymal Echotexture: Mildly heterogenous

Isthmus: 0.1 cm

Right lobe: 3.9 x 1.0 x 1.6 cm

Left lobe: 3.5 x 0.9 x 1 3 cm

_________________________________________________________

Estimated total number of nodules >/= 1 cm: 2

Number of spongiform nodules >/=  2 cm not described below (TR1): 0

Number of mixed cystic and solid nodules >/= 1.5 cm not described
below (TR2): 0

_________________________________________________________

Nodule # 1:

Location: Right; mid

Maximum size: 1.5 cm; Other 2 dimensions: 1.3 x 0.8 cm

Composition: mixed cystic and solid (1)

Echogenicity: isoechoic (1)

Shape: not taller-than-wide (0)

Margins: smooth (0)

Echogenic foci: none (0)

ACR TI-RADS total points: 2.

ACR TI-RADS risk category: TR2 (2 points).

ACR TI-RADS recommendations:

This nodule does NOT meet TI-RADS criteria for biopsy or dedicated
follow-up.

_________________________________________________________

1.0 x 0.9 x 0.7 cm hypoechoic solid nodule located adjacent to the
inferior tip of the right thyroid lobe is suspicious for a
parathyroid adenoma.
IMPRESSION: 1.0 x 0.9 x 0.7 cm solid hypoechoic nodule appears adjacent to the
inferior tip of the right thyroid lobe is suspicious for a
parathyroid adenoma. Correlation with laboratory workup for
hyperparathyroidism and nuclear medicine parathyroid scan would be
beneficial. If these findings are negative for parathyroid lesion, a
follow-up ultrasound of the thyroid should be performed in 1 year to
again evaluate this nodule.

The above is in keeping with the ACR TI-RADS recommendations - [HOSPITAL] 4716;[DATE].

## 2023-11-07 ENCOUNTER — Ambulatory Visit: Payer: Medicare Other | Admitting: Internal Medicine

## 2023-11-07 ENCOUNTER — Encounter: Payer: Self-pay | Admitting: Internal Medicine

## 2023-11-07 VITALS — BP 104/68 | HR 72 | Ht 61.5 in | Wt 143.0 lb

## 2023-11-07 DIAGNOSIS — E0500 Thyrotoxicosis with diffuse goiter without thyrotoxic crisis or storm: Secondary | ICD-10-CM | POA: Diagnosis not present

## 2023-11-07 DIAGNOSIS — E0590 Thyrotoxicosis, unspecified without thyrotoxic crisis or storm: Secondary | ICD-10-CM | POA: Diagnosis not present

## 2023-11-07 NOTE — Progress Notes (Signed)
 Name: Diane Delacruz  MRN/ DOB: [id number], 17-Apr-1946    Age/ Sex: 78 y.o., female    PCP: Clemens Curt, MD   Reason for Endocrinology Evaluation: Hyperthyroidism     Date of Initial Endocrinology Evaluation: 09/23/2021    HPI: Diane Delacruz is a 78 y.o. female with a past medical history of Osteoporosis and Graves' disease. The patient presented for initial endocrinology clinic visit on 09/23/2021 for consultative assistance with her Hyperthyroidism .   Pt has been diagnosed with hyperthyroidism in 07/2021 with a suppressed TSH at 0.01 and elevated FT4 at 5.8 pg/mL . She was having weight loss at the time  TRAb elevated 9.23 IU/L Started methimazole  09/2021  Of note the pt has osteoporosis    Mother with thyroid  disease ( hypothyroidism )    SUBJECTIVE:    Today (11/07/23):  Diane Delacruz is here for a follow up on hyperthyroidism management . 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 Gwendlyn Lemmings   Depression    Diffuse cystic mastopathy    Diverticulosis    Dyspnea 2008   negative echo and MV scan   Esophageal reflux    History of gallstones    Hypoglycemia, unspecified    Irritable bowel syndrome    Lumbago    Palpitations 03/06/2009   event monitor   Pure hypercholesterolemia    Stress fracture of foot 03/28/1999   left   Symptomatic menopausal  or female climacteric states    Thyroid  disease 07/2021   Unspecified adverse effect of other drug, medicinal and biological substance(995.29)    Unspecified vitamin D  deficiency    Past Surgical History:  Past Surgical History:  Procedure Laterality Date   BLADDER SURGERY     tumor removed   CHOLECYSTECTOMY  2000   gallstones- ultrasound 10/03   COLONOSCOPY      Social History:  reports that she has never smoked. She has never used smokeless tobacco. She reports that she does not currently use alcohol. She reports that she does not use drugs. Family History: family history includes Arthritis in her mother; Breast cancer in her paternal aunt; COPD in her sister; Colon cancer (age of onset: 42) in her father; Colon polyps in her brother and maternal aunt; Diabetes in her brother and paternal uncle; Heart disease in her father; Hyperlipidemia in her mother; Irritable bowel syndrome in her mother and sister; Kidney disease in her cousin; Macular degeneration in her mother; Osteopenia in her sister; Other in her daughter; Stroke in her mother. She was adopted.   HOME MEDICATIONS: Allergies as of 11/07/2023       Reactions   Amoxicillin    Rash and diarrhea    Codeine    REACTION: unknown reaction   Flexeril [cyclobenzaprine]  sedation   Klonopin [clonazepam]    Dizziness , fatigue   Nsaids    REACTION: diarrhea   Omeprazole     REACTION: did not work for her   Pseudoephedrine    REACTION: jittery   Tessalon  [benzonatate ] Rash   Rash        Medication List        Accurate as of November 07, 2023  1:32 PM. If you have any questions, ask your nurse or doctor.          alendronate  70 MG tablet Commonly known as: FOSAMAX  Take 1 tablet (70 mg total) by mouth every 7 (seven) days. Take with a full glass of water on an empty stomach.   CALCIUM 1200+D3 PO Take 1 tablet by mouth daily.   cyanocobalamin  500 MCG tablet Commonly known as: VITAMIN B12 Take 500 mcg by mouth  daily.   esomeprazole  40 MG capsule Commonly known as: NEXIUM  Take 1 capsule (40 mg total) by mouth in the morning.   fexofenadine 60 MG tablet Commonly known as: ALLEGRA Take 180 mg by mouth daily as needed for allergies.   methimazole  5 MG tablet Commonly known as: TAPAZOLE  Take 1 tablet (5 mg total) by mouth as directed. 1 tablet twice a week   mometasone  50 MCG/ACT nasal spray Commonly known as: NASONEX  Place 2 sprays into the nose daily as needed (allergies).   pravastatin  20 MG tablet Commonly known as: PRAVACHOL  TAKE ONE TABLET BY MOUTH EVERY OTHER DAY   Qunol Ultra CoQ10 100-150 MG-UNIT Caps Generic drug: Coenzyme Q10-Vitamin E          REVIEW OF SYSTEMS: A comprehensive ROS was conducted with the patient and is negative except as per HPI    OBJECTIVE:  VS: BP 104/68 (BP Location: Left Arm, Patient Position: Sitting, Cuff Size: Normal)   Pulse 72   Ht 5' 1.5" (1.562 m)   Wt 143 lb (64.9 kg)   LMP 07/19/1995   SpO2 98%   BMI 26.58 kg/m    Wt Readings from Last 3 Encounters:  11/07/23 143 lb (64.9 kg)  08/17/23 141 lb 4 oz (64.1 kg)  08/15/23 145 lb (65.8 kg)     EXAM: General: Pt appears well and is in NAD  Neck: General: Supple without adenopathy. Thyroid : Thyroid  size normal.  No goiter or nodules appreciated.   Lungs: Clear with good BS bilat   Heart: Auscultation: RRR.  Abdomen: soft, nontender  Extremities:  BL LE: No pretibial edema   Mental Status: Judgment, insight: Intact Orientation: Oriented to time, place, and person Mood and affect: No depression, anxiety, or agitation     DATA REVIEWED: ***     Latest Reference Range & Units 05/03/23 09:51  Calcium 8.6 - 10.4 mg/dL 9.7  Albumin 3.5 - 5.2 g/dL 4.1    Latest Reference Range & Units 05/03/23 09:51  VITD 30.00 - 100.00 ng/mL 45.13  PTH, Intact 16 - 77 pg/mL 39  TSH 0.35 - 5.50 uIU/mL 2.48    Latest Reference Range & Units 09/23/21 11:59  TRAB <=2.00 IU/L 9.23 (H)      Thyroid  ULtrasound 11/22/2022  Estimated total number of nodules >/= 1 cm: 1   Number of spongiform nodules >/=  2 cm not described below (TR1): 0   Number of mixed cystic and solid nodules >/= 1.5 cm not described below (TR2): 0   _________________________________________________________   Circumscribed hypoechoic nodule deep to the inferior aspect of the right lower gland is stable  at 1.1 x 0.8 x 0.8 cm. No significant enlargement or change in appearance.   No new thyroid  nodules or suspicious features in the thyroid  gland.   IMPRESSION: Stable appearance of 1.1 cm hypoechoic solid nodule deep and inferior to the right inferior thyroid  gland. Differential considerations include nonfunctioning parathyroid  adenoma (given normal PTH and calcium), or less likely an exophytic thyroid  nodule.   Otherwise, mildly heterogeneous but unremarkable thyroid  gland. No other thyroid  nodules noted that would meet criteria for further evaluation.  ASSESSMENT/PLAN/RECOMMENDATIONS:   Hyperthyroidism:  -Patient is clinically euthyroid -No local neck symptoms -TFTs remain normal, will decrease methimazole  as below  Medications : Decrease methimazole  5 mg , 1 tablet 2 days a week    2. 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 :   -Calcium and PTH are normal -Ultrasound 10/2022 continue to show suspicion for parathyroid  adenoma  Follow-up in 6 months    Signed electronically by: Natale Bail, MD  [facility] Endocrinology  [facility] [address] [place] [place], [place] [address] Phone: [phone] FAX: [phone]   CC: Tower, Manley Seeds, MD [address] [place] [address] Phone: [phone] Fax: [phone]   Return to Endocrinology clinic as below: Future Appointments  Date Time Provider Department Center  08/12/2024  9:00 AM  [facility]  08/15/2024  9:30 AM [facility] ANNUAL WELLNESS VISIT 1 [facility]  08/19/2024  9:30 AM Tower, Manley Seeds, MD [facility]

## 2023-11-08 ENCOUNTER — Encounter: Payer: Self-pay | Admitting: Internal Medicine

## 2023-11-08 LAB — TSH: TSH: 1.86 m[IU]/L (ref 0.40–4.50)

## 2023-11-08 LAB — T4, FREE: Free T4: 1 ng/dL (ref 0.8–1.8)

## 2023-11-08 MED ORDER — METHIMAZOLE 5 MG PO TABS: 5.0000 mg | ORAL_TABLET | ORAL | 3 refills | Status: DC

## 2023-11-13 DIAGNOSIS — H2513 Age-related nuclear cataract, bilateral: Secondary | ICD-10-CM | POA: Diagnosis not present

## 2023-11-13 DIAGNOSIS — H5201 Hypermetropia, right eye: Secondary | ICD-10-CM | POA: Diagnosis not present

## 2023-11-13 DIAGNOSIS — H52223 Regular astigmatism, bilateral: Secondary | ICD-10-CM | POA: Diagnosis not present

## 2023-11-13 DIAGNOSIS — H524 Presbyopia: Secondary | ICD-10-CM | POA: Diagnosis not present

## 2023-11-13 DIAGNOSIS — H5212 Myopia, left eye: Secondary | ICD-10-CM | POA: Diagnosis not present

## 2023-11-13 DIAGNOSIS — H25043 Posterior subcapsular polar age-related cataract, bilateral: Secondary | ICD-10-CM | POA: Diagnosis not present

## 2023-12-28 ENCOUNTER — Ambulatory Visit (INDEPENDENT_AMBULATORY_CARE_PROVIDER_SITE_OTHER): Admitting: General Practice

## 2023-12-28 ENCOUNTER — Ambulatory Visit: Admitting: Family Medicine

## 2023-12-28 VITALS — BP 110/60 | HR 73 | Temp 97.9°F | Ht 61.5 in | Wt 142.0 lb

## 2023-12-28 DIAGNOSIS — R350 Frequency of micturition: Secondary | ICD-10-CM | POA: Insufficient documentation

## 2023-12-28 DIAGNOSIS — E559 Vitamin D deficiency, unspecified: Secondary | ICD-10-CM

## 2023-12-28 DIAGNOSIS — E0590 Thyrotoxicosis, unspecified without thyrotoxic crisis or storm: Secondary | ICD-10-CM | POA: Diagnosis not present

## 2023-12-28 DIAGNOSIS — R42 Dizziness and giddiness: Secondary | ICD-10-CM | POA: Diagnosis not present

## 2023-12-28 LAB — POCT URINE DIPSTICK
Bilirubin, UA: NEGATIVE
Glucose, UA: NEGATIVE mg/dL
Ketones, POC UA: NEGATIVE mg/dL
Leukocytes, UA: NEGATIVE
Nitrite, UA: NEGATIVE
POC PROTEIN,UA: NEGATIVE
Spec Grav, UA: 1.01
Urobilinogen, UA: 0.2 U/dL
pH, UA: 6

## 2023-12-28 NOTE — Assessment & Plan Note (Signed)
 Urine dipstick shows negative for nitrites, leukocytes, ketones; small amount of blood was present. Given her presentation and symptoms, will check urine culture.   Await results.

## 2023-12-28 NOTE — Assessment & Plan Note (Addendum)
 Unclear etiology.  Neuro exam stable. No red flags on exam. Stable for outpatient treatment.  Differentials include secondary to medication change, vitamin deficiency.   Cbc with diff pending, TSH pending, CMP pending, vitamin d  and b12 pending. Reviewed labs from earlier in the year.  Reviewed notes from endocrinology from April.   Await results.

## 2023-12-28 NOTE — Progress Notes (Signed)
 Established Patient Office Visit  Subjective   Patient ID: Diane Delacruz, female    DOB: [birth year]  Age: 78 y.o. MRN: [id number]  Chief Complaint  Patient presents with   Fatigue and Lightheaded    Started to feel very tired the last 2 weeks. Within the last few days has been feeling lightheaded/dizzy. Denies shortness of breath. HAs not checked BP. Not diabetic.     HPI  Diane Delacruz is a 78 year old female, patient of Dr. Malissa Se, with pats medical history of GERD, IBS, hyperthyroidism, osteoporosis, HLD presents today for an acute visit.   Fatigue and lightheaded: symptom onset was 2 weeks ago with increased fatigue. She has had to push herself to get out of bed. She does not sleep well at night but that's chronic. She had a fall a few days ago but did not hit her head. She has had increased urinary frequency.  No abnormal bleeding.  No fever, chills, nausea, vomiting, diarrhea, urinary symptoms. No changes to her medications recently. Has a history of hyperthyroidism, followed by endocrinology, last TSH was in April within normal limits. She was decreased her Methimazole  from 10 to 5 mg once weekly. She has not had her TSH levels checked since the change . Vitamin d  and b12 were checked in January and were within normal limits. She takes daily vitamin d  and b 12.  Patient Active Problem List   Diagnosis Date Noted   Urinary frequency 12/28/2023   Hx of adenomatous colonic polyps 08/11/2022   Graves disease 12/24/2021   Hyperthyroidism 12/24/2021   Current use of proton pump inhibitor 08/09/2021   Vitamin B12 deficiency 08/09/2021   Elevated glucose level 06/21/2017   Estrogen deficiency 04/27/2015   Routine general medical examination at a health care facility 04/19/2015   Insomnia 02/21/2011   Family history of colon cancer 11/01/2010   Hyperlipidemia 11/01/2010   Dizziness 11/01/2010   Vitamin D  deficiency 10/10/2008   IRRITABLE BOWEL SYNDROME 10/15/2007   VARICOSE  VEINS, LOWER EXTREMITIES 08/15/2007   Osteoporosis 05/14/2007   ALLERGIC RHINITIS, SEASONAL 05/11/2007   GERD 05/11/2007   FIBROCYSTIC BREAST DISEASE 05/11/2007   POSTMENOPAUSAL STATUS 05/11/2007   Past Medical History:  Diagnosis Date   Allergic rhinitis due to pollen    Allergy 0000   Anxiety    past hx   Asymptomatic varicose veins    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 Gwendlyn Lemmings   Depression    Diffuse cystic mastopathy    Diverticulosis    Dyspnea 2008   negative echo and MV scan   Esophageal reflux    History of gallstones    Hypoglycemia, unspecified    Irritable bowel syndrome    Lumbago    Palpitations 03/06/2009   event monitor   Pure hypercholesterolemia    Stress fracture of foot 03/28/1999   left   Symptomatic menopausal or female climacteric states    Thyroid  disease 07/2021   Unspecified adverse effect of other drug, medicinal and biological substance(995.29)    Unspecified vitamin D  deficiency    Past Surgical History:  Procedure Laterality Date   BLADDER SURGERY     tumor removed   CHOLECYSTECTOMY  2000   gallstones- ultrasound 10/03   COLONOSCOPY     Allergies  Allergen Reactions   Amoxicillin     Rash and diarrhea    Codeine  REACTION: unknown reaction   Flexeril [Cyclobenzaprine]     sedation   Klonopin [Clonazepam]     Dizziness , fatigue   Nsaids     REACTION: diarrhea   Omeprazole      REACTION: did not work for her   Pseudoephedrine     REACTION: jittery   Tessalon  [Benzonatate ] Rash    Rash         12/28/2023   11:17 AM 08/17/2023    9:55 AM 08/15/2023    9:44 AM  Depression screen PHQ 2/9  Decreased Interest 0 0 0  Down, Depressed, Hopeless 0 0 0  PHQ - 2 Score 0 0 0  Altered sleeping  0   Tired, decreased energy  0   Change in appetite  0   Feeling bad or failure about yourself   0   Trouble concentrating  0   Moving  slowly or fidgety/restless  0   Suicidal thoughts  0   PHQ-9 Score  0   Difficult doing work/chores  Not difficult at all        08/17/2023    9:55 AM 01/24/2023    8:17 AM 12/22/2022    8:57 AM  GAD 7 : Generalized Anxiety Score  Nervous, Anxious, on Edge 0 0 0  Control/stop worrying 0 0 0  Worry too much - different things 0 0 0  Trouble relaxing 0 0 0  Restless 0 0 0  Easily annoyed or irritable 0 0 0  Afraid - awful might happen 0 0 0  Total GAD 7 Score 0 0 0  Anxiety Difficulty Not difficult at all Not difficult at all Not difficult at all      Review of Systems  Constitutional:  Positive for malaise/fatigue. Negative for chills and fever.  Respiratory:  Negative for shortness of breath.   Cardiovascular:  Negative for chest pain.  Gastrointestinal:  Negative for abdominal pain, constipation, diarrhea, heartburn, nausea and vomiting.  Genitourinary:  Positive for frequency. Negative for dysuria and urgency.  Neurological:  Positive for dizziness. Negative for headaches.  Endo/Heme/Allergies:  Negative for polydipsia.  Psychiatric/Behavioral:  Negative for depression and suicidal ideas. The patient is not nervous/anxious.       Objective:     BP 110/60 (BP Location: Left Arm, Patient Position: Sitting, Cuff Size: Normal)   Pulse 73   Temp 97.9 F (36.6 C) (Oral)   Ht 5' 1.5 (1.562 m)   Wt 142 lb (64.4 kg)   LMP 07/19/1995   SpO2 98%   BMI 26.40 kg/m  BP Readings from Last 3 Encounters:  12/28/23 110/60  11/07/23 104/68  08/17/23 122/62   Wt Readings from Last 3 Encounters:  12/28/23 142 lb (64.4 kg)  11/07/23 143 lb (64.9 kg)  08/17/23 141 lb 4 oz (64.1 kg)      Physical Exam Vitals and nursing note reviewed.  Constitutional:      Appearance: She is ill-appearing.   Cardiovascular:     Rate and Rhythm: Normal rate and regular rhythm.     Pulses: Normal pulses.     Heart sounds: Normal heart sounds.  Pulmonary:     Effort: Pulmonary effort is  normal.     Breath sounds: Normal breath sounds.  Abdominal:     General: Abdomen is flat.     Tenderness: There is no right CVA tenderness or left CVA tenderness.   Skin:    General: Skin is warm.   Neurological:     Mental  Status: She is alert and oriented to person, place, and time.     Cranial Nerves: Cranial nerves 2-12 are intact.     Sensory: Sensation is intact.     Motor: Motor function is intact.     Coordination: Coordination is intact.     Gait: Gait is intact.   Psychiatric:        Mood and Affect: Mood normal.        Behavior: Behavior normal.        Thought Content: Thought content normal.        Judgment: Judgment normal.      Results for orders placed or performed in visit on 12/28/23  POCT URINE DIPSTICK  Result Value Ref Range   Color, UA light yellow (A) yellow   Clarity, UA clear clear   Glucose, UA negative negative mg/dL   Bilirubin, UA negative negative   Ketones, POC UA negative negative mg/dL   Spec Grav, UA 1.610 9.604 - 1.025   Blood, UA small (A) negative   pH, UA 6.0 5.0 - 8.0   POC PROTEIN,UA negative negative, trace   Urobilinogen, UA 0.2 0.2 or 1.0 E.U./dL   Nitrite, UA Negative Negative   Leukocytes, UA Negative Negative       The 10-year ASCVD risk score (Arnett DK, et al., 2019) is: 15.7%    Assessment & Plan:  Urinary frequency Assessment & Plan: Urine dipstick shows negative for nitrites, leukocytes, ketones; small amount of blood was present. Given her presentation and symptoms, will check urine culture.   Await results.    Orders: -     POCT URINE DIPSTICK -     Urine Culture  Dizziness Assessment & Plan: Unclear etiology.  Neuro exam stable. No red flags on exam. Stable for outpatient treatment.  Differentials include secondary to medication change, vitamin deficiency.   Cbc with diff pending, TSH pending, CMP pending, vitamin d  and b12 pending. Reviewed labs from earlier in the year.  Reviewed notes from  endocrinology from April.   Await results.  Orders: -     Vitamin B12 -     TSH -     CBC with Differential/Platelet -     Basic metabolic panel with GFR     Return if symptoms worsen or fail to improve.    Jolanda Nation, NP

## 2023-12-28 NOTE — Patient Instructions (Signed)
 Stop by the lab prior to leaving today. I will notify you of your results once received.   I will be in touch with the next step of our plan once I get your results.   It was a pleasure meeting you!

## 2023-12-29 ENCOUNTER — Ambulatory Visit: Payer: Self-pay | Admitting: General Practice

## 2023-12-29 DIAGNOSIS — E559 Vitamin D deficiency, unspecified: Secondary | ICD-10-CM

## 2023-12-29 LAB — BASIC METABOLIC PANEL WITH GFR
BUN: 13 mg/dL (ref 7–25)
CO2: 25 mmol/L (ref 20–32)
Calcium: 9.7 mg/dL (ref 8.6–10.4)
Chloride: 105 mmol/L (ref 98–110)
Creat: 0.99 mg/dL (ref 0.60–1.00)
Glucose, Bld: 102 mg/dL — ABNORMAL HIGH (ref 65–99)
Potassium: 4.6 mmol/L (ref 3.5–5.3)
Sodium: 137 mmol/L (ref 135–146)
eGFR: 59 mL/min/{1.73_m2} — ABNORMAL LOW (ref >=60)

## 2023-12-29 LAB — CBC WITH DIFFERENTIAL/PLATELET
Absolute Lymphocytes: 2307 {cells}/uL (ref 850–3900)
Absolute Monocytes: 431 {cells}/uL (ref 200–950)
Basophils Absolute: 51 {cells}/uL (ref 0–200)
Basophils Relative: 0.7 %
Eosinophils Absolute: 336 {cells}/uL (ref 15–500)
Eosinophils Relative: 4.6 %
HCT: 39 % (ref 35.0–45.0)
Hemoglobin: 12.8 g/dL (ref 11.7–15.5)
MCH: 31.1 pg (ref 27.0–33.0)
MCHC: 32.8 g/dL (ref 32.0–36.0)
MCV: 94.9 fL (ref 80.0–100.0)
MPV: 9.6 fL (ref 7.5–12.5)
Monocytes Relative: 5.9 %
Neutro Abs: 4176 {cells}/uL (ref 1500–7800)
Neutrophils Relative %: 57.2 %
Platelets: 304 10*3/uL (ref 140–400)
RBC: 4.11 10*6/uL (ref 3.80–5.10)
RDW: 12 % (ref 11.0–15.0)
Total Lymphocyte: 31.6 %
WBC: 7.3 10*3/uL (ref 3.8–10.8)

## 2023-12-29 LAB — URINE CULTURE
MICRO NUMBER:: 16572644
Result:: NO GROWTH
SPECIMEN QUALITY:: ADEQUATE

## 2023-12-29 LAB — TEST AUTHORIZATION

## 2023-12-29 LAB — TSH: TSH: 1.47 m[IU]/L (ref 0.40–4.50)

## 2023-12-29 LAB — VITAMIN B12: Vitamin B-12: 1543 pg/mL — ABNORMAL HIGH (ref 200–1100)

## 2023-12-29 LAB — VITAMIN D 25 HYDROXY (VIT D DEFICIENCY, FRACTURES): Vit D, 25-Hydroxy: 54 ng/mL (ref 30–100)

## 2024-01-01 DIAGNOSIS — H2512 Age-related nuclear cataract, left eye: Secondary | ICD-10-CM | POA: Diagnosis not present

## 2024-01-01 DIAGNOSIS — H25043 Posterior subcapsular polar age-related cataract, bilateral: Secondary | ICD-10-CM | POA: Diagnosis not present

## 2024-01-01 DIAGNOSIS — H40013 Open angle with borderline findings, low risk, bilateral: Secondary | ICD-10-CM | POA: Diagnosis not present

## 2024-01-01 DIAGNOSIS — H2513 Age-related nuclear cataract, bilateral: Secondary | ICD-10-CM | POA: Diagnosis not present

## 2024-01-01 DIAGNOSIS — H25013 Cortical age-related cataract, bilateral: Secondary | ICD-10-CM | POA: Diagnosis not present

## 2024-03-07 ENCOUNTER — Encounter: Payer: Self-pay | Admitting: Internal Medicine

## 2024-03-28 DIAGNOSIS — H25011 Cortical age-related cataract, right eye: Secondary | ICD-10-CM | POA: Diagnosis not present

## 2024-03-28 DIAGNOSIS — H2511 Age-related nuclear cataract, right eye: Secondary | ICD-10-CM | POA: Diagnosis not present

## 2024-03-28 DIAGNOSIS — Z961 Presence of intraocular lens: Secondary | ICD-10-CM | POA: Diagnosis not present

## 2024-03-28 DIAGNOSIS — H25041 Posterior subcapsular polar age-related cataract, right eye: Secondary | ICD-10-CM | POA: Diagnosis not present

## 2024-03-28 DIAGNOSIS — H2512 Age-related nuclear cataract, left eye: Secondary | ICD-10-CM | POA: Diagnosis not present

## 2024-05-06 ENCOUNTER — Other Ambulatory Visit

## 2024-05-06 ENCOUNTER — Ambulatory Visit: Admitting: Internal Medicine

## 2024-05-06 ENCOUNTER — Encounter: Payer: Self-pay | Admitting: Internal Medicine

## 2024-05-06 VITALS — BP 116/74 | HR 60 | Ht 61.5 in | Wt 138.4 lb

## 2024-05-06 DIAGNOSIS — E0500 Thyrotoxicosis with diffuse goiter without thyrotoxic crisis or storm: Secondary | ICD-10-CM | POA: Diagnosis not present

## 2024-05-06 DIAGNOSIS — E0590 Thyrotoxicosis, unspecified without thyrotoxic crisis or storm: Secondary | ICD-10-CM | POA: Diagnosis not present

## 2024-05-06 LAB — T4, FREE: Free T4: 1.2 ng/dL (ref 0.8–1.8)

## 2024-05-06 LAB — TSH: TSH: 1.49 m[IU]/L (ref 0.40–4.50)

## 2024-05-06 NOTE — Progress Notes (Unsigned)
 Name: Diane Delacruz  MRN/ DOB: [id number], February 09, 1946    Age/ Sex: 78 y.o., female    PCP: Randeen Laine LABOR, MD   Reason for Endocrinology Evaluation: Hyperthyroidism     Date of Initial Endocrinology Evaluation: 09/23/2021    HPI: Ms. IVALENE Delacruz is a 78 y.o. female with a past medical history of Osteoporosis and Graves' disease. The patient presented for initial endocrinology clinic visit on 09/23/2021 for consultative assistance with her Hyperthyroidism .   Pt has been diagnosed with hyperthyroidism in 07/2021 with a suppressed TSH at 0.01 and elevated FT4 at 5.8 pg/mL . She was having weight loss at the time  TRAb elevated 9.23 IU/L Started methimazole  09/2021  Of note the pt has osteoporosis    Mother with thyroid  disease ( hypothyroidism )    SUBJECTIVE:    Today (05/06/24):  [name] is here for a follow up on hyperthyroidism management .  Patient has been noted weight loss since her last visit here Since her last visit here, she has had a left cataract sx , pending right eye sx in 06/2024 No neck swelling Has mild eye pruritus  No palpitations  No tremors  Has occasional changes with IBS   Methimazole  5 mg , 1 tablet weekly      HISTORY:  Past Medical History:  Past Medical History:  Diagnosis Date   Allergic rhinitis due to pollen    Allergy 0000   Anxiety    past hx   Asymptomatic varicose veins    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 Malcolm   Depression    Diffuse cystic mastopathy    Diverticulosis    Dyspnea 2008   negative echo and MV scan   Esophageal reflux    History of gallstones    Hypoglycemia, unspecified    Irritable bowel syndrome    Lumbago    Palpitations 03/06/2009   event monitor   Pure hypercholesterolemia    Stress fracture of foot 03/28/1999   left   Symptomatic menopausal or female climacteric states    Thyroid   disease 07/2021   Unspecified adverse effect of other drug, medicinal and biological substance(995.29)    Unspecified vitamin D  deficiency    Past Surgical History:  Past Surgical History:  Procedure Laterality Date   BLADDER SURGERY     tumor removed   CHOLECYSTECTOMY  2000   gallstones- ultrasound 10/03   COLONOSCOPY      Social History:  reports that she has never smoked. She has never used smokeless tobacco. She reports that she does not currently use alcohol. She reports that she does not use drugs. Family History: family history includes Arthritis in her mother; Breast cancer in her paternal aunt; COPD in her sister; Colon cancer (age of onset: 86) in her father; Colon polyps in her brother and maternal aunt; Diabetes in her brother and paternal uncle; Heart disease in her father; Hyperlipidemia in her mother; Irritable bowel syndrome in her mother and sister; Kidney disease in her cousin; Macular degeneration in her mother; Osteopenia in her sister; Other in her daughter; Stroke in her mother. She was adopted.   HOME MEDICATIONS: Allergies as of 05/06/2024       Reactions   Amoxicillin    Rash and diarrhea    Codeine    REACTION: unknown reaction   Flexeril [cyclobenzaprine]  sedation   Klonopin [clonazepam]    Dizziness , fatigue   Nsaids    REACTION: diarrhea   Omeprazole     REACTION: did not work for her   Pseudoephedrine    REACTION: jittery   Tessalon  [benzonatate ] Rash   Rash        Medication List        Accurate as of May 06, 2024  1:03 PM. If you have any questions, ask your nurse or doctor.          alendronate  70 MG tablet Commonly known as: FOSAMAX  Take 1 tablet (70 mg total) by mouth every 7 (seven) days. Take with a full glass of water on an empty stomach.   CALCIUM 1200+D3 PO Take 1 tablet by mouth daily.   cyanocobalamin  500 MCG tablet Commonly known as: VITAMIN B12 Take 500 mcg by mouth daily.   esomeprazole  40 MG  capsule Commonly known as: NEXIUM  Take 1 capsule (40 mg total) by mouth in the morning.   fexofenadine 60 MG tablet Commonly known as: ALLEGRA Take 180 mg by mouth daily as needed for allergies.   methimazole  5 MG tablet Commonly known as: TAPAZOLE  Take 1 tablet (5 mg total) by mouth as directed. 1 tablet a week   mometasone  50 MCG/ACT nasal spray Commonly known as: NASONEX  Place 2 sprays into the nose daily as needed (allergies).   pravastatin  20 MG tablet Commonly known as: PRAVACHOL  TAKE ONE TABLET BY MOUTH EVERY OTHER DAY   Qunol Ultra CoQ10 100-150 MG-UNIT Caps Generic drug: Coenzyme Q10-Vitamin E          REVIEW OF SYSTEMS: A comprehensive ROS was conducted with the patient and is negative except as per HPI    OBJECTIVE:  VS: BP 116/74 (BP Location: Left Arm, Patient Position: Sitting, Cuff Size: Normal)   Pulse 60   Ht 5' 1.5 (1.562 m)   Wt 138 lb 6.4 oz (62.8 kg)   LMP 07/19/1995   SpO2 97%   BMI 25.73 kg/m    Wt Readings from Last 3 Encounters:  05/06/24 138 lb 6.4 oz (62.8 kg)  12/28/23 142 lb (64.4 kg)  11/07/23 143 lb (64.9 kg)     EXAM: General: Pt appears well and is in NAD  Neck: General: Supple without adenopathy. Thyroid : Thyroid  size normal.  No goiter or nodules appreciated.   Lungs: Clear with good BS bilat   Heart: Auscultation: RRR.  Abdomen: soft, nontender  Extremities:  BL LE: No pretibial edema   Mental Status: Judgment, insight: Intact Orientation: Oriented to time, place, and person Mood and affect: No depression, anxiety, or agitation     DATA REVIEWED: ***   Latest Reference Range & Units 09/23/21 11:59  TRAB <=2.00 IU/L 9.23 (H)     Thyroid  ULtrasound 11/22/2022  Estimated total number of nodules >/= 1 cm: 1   Number of spongiform nodules >/=  2 cm not described below (TR1): 0   Number of mixed cystic and solid nodules >/= 1.5 cm not described below (TR2): 0    _________________________________________________________   Circumscribed hypoechoic nodule deep to the inferior aspect of the right lower gland is stable at 1.1 x 0.8 x 0.8 cm. No significant enlargement or change in appearance.   No new thyroid  nodules or suspicious features in the thyroid  gland.   IMPRESSION: Stable appearance of 1.1 cm hypoechoic solid nodule deep and inferior to the right inferior thyroid  gland. Differential considerations include nonfunctioning parathyroid  adenoma (given normal PTH and  calcium), or less likely an exophytic thyroid  nodule.   Otherwise, mildly heterogeneous but unremarkable thyroid  gland. No other thyroid  nodules noted that would meet criteria for further evaluation.  ASSESSMENT/PLAN/RECOMMENDATIONS:   Hyperthyroidism:  - Medications : Decrease methimazole  5 mg , 1 tablet 1 day a week    2. Graves' Disease:  -No extrathyroidal manifestations of Graves' disease      3. MNG: -No local neck symptoms -There was a concern that this may be a parathyroid  adenoma, calcium, and PTH have been normal - Results have been stable, will consider repeat ultrasound in 1-2 years  Follow-up in 6 months    Signed electronically by: Stefano Redgie Butts, MD  [facility] Endocrinology  [facility] [address] [place], [place] [address] Phone: [phone] FAX: [phone]   CC: Tower, Laine LABOR, MD [address] [place] [address] Phone: [phone] Fax: [phone]   Return to Endocrinology clinic as below: Future Appointments  Date Time Provider Department Center  08/12/2024  9:00 AM [facility] [address]  08/15/2024  9:30 AM [facility] ANNUAL WELLNESS VISIT 1 [facility] [address]  08/19/2024  9:30 AM Tower, Laine LABOR, MD [facility] [address]

## 2024-05-07 ENCOUNTER — Ambulatory Visit: Payer: Self-pay | Admitting: Internal Medicine

## 2024-06-03 DIAGNOSIS — H2511 Age-related nuclear cataract, right eye: Secondary | ICD-10-CM | POA: Diagnosis not present

## 2024-06-03 DIAGNOSIS — H40013 Open angle with borderline findings, low risk, bilateral: Secondary | ICD-10-CM | POA: Diagnosis not present

## 2024-07-04 DIAGNOSIS — H2511 Age-related nuclear cataract, right eye: Secondary | ICD-10-CM | POA: Diagnosis not present

## 2024-07-04 DIAGNOSIS — H25041 Posterior subcapsular polar age-related cataract, right eye: Secondary | ICD-10-CM | POA: Diagnosis not present

## 2024-07-04 DIAGNOSIS — H25011 Cortical age-related cataract, right eye: Secondary | ICD-10-CM | POA: Diagnosis not present

## 2024-08-04 ENCOUNTER — Telehealth: Payer: Self-pay | Admitting: Family Medicine

## 2024-08-04 DIAGNOSIS — E0590 Thyrotoxicosis, unspecified without thyrotoxic crisis or storm: Secondary | ICD-10-CM

## 2024-08-04 DIAGNOSIS — M810 Age-related osteoporosis without current pathological fracture: Secondary | ICD-10-CM

## 2024-08-04 DIAGNOSIS — R7309 Other abnormal glucose: Secondary | ICD-10-CM

## 2024-08-04 DIAGNOSIS — E559 Vitamin D deficiency, unspecified: Secondary | ICD-10-CM

## 2024-08-04 DIAGNOSIS — E538 Deficiency of other specified B group vitamins: Secondary | ICD-10-CM

## 2024-08-04 DIAGNOSIS — Z79899 Other long term (current) drug therapy: Secondary | ICD-10-CM

## 2024-08-04 DIAGNOSIS — E7800 Pure hypercholesterolemia, unspecified: Secondary | ICD-10-CM

## 2024-08-04 NOTE — Telephone Encounter (Signed)
-----   Message from Veva JINNY Ferrari sent at 07/30/2024  3:13 PM EST ----- Regarding: Lab orders for MON, 1.22.26 Patient is scheduled for CPX labs, please order future labs, Thanks , Veva

## 2024-08-06 ENCOUNTER — Other Ambulatory Visit

## 2024-08-07 LAB — T4, FREE: Free T4: 1.1 ng/dL (ref 0.8–1.8)

## 2024-08-07 LAB — TSH: TSH: 1.01 m[IU]/L (ref 0.40–4.50)

## 2024-08-12 ENCOUNTER — Other Ambulatory Visit: Payer: Medicare Other

## 2024-08-14 ENCOUNTER — Other Ambulatory Visit

## 2024-08-14 ENCOUNTER — Ambulatory Visit: Payer: Self-pay | Admitting: Family Medicine

## 2024-08-14 DIAGNOSIS — E559 Vitamin D deficiency, unspecified: Secondary | ICD-10-CM | POA: Diagnosis not present

## 2024-08-14 DIAGNOSIS — R7309 Other abnormal glucose: Secondary | ICD-10-CM

## 2024-08-14 DIAGNOSIS — E7800 Pure hypercholesterolemia, unspecified: Secondary | ICD-10-CM | POA: Diagnosis not present

## 2024-08-14 DIAGNOSIS — E538 Deficiency of other specified B group vitamins: Secondary | ICD-10-CM | POA: Diagnosis not present

## 2024-08-14 DIAGNOSIS — Z79899 Other long term (current) drug therapy: Secondary | ICD-10-CM | POA: Diagnosis not present

## 2024-08-14 DIAGNOSIS — M810 Age-related osteoporosis without current pathological fracture: Secondary | ICD-10-CM

## 2024-08-14 DIAGNOSIS — E0590 Thyrotoxicosis, unspecified without thyrotoxic crisis or storm: Secondary | ICD-10-CM

## 2024-08-14 LAB — COMPREHENSIVE METABOLIC PANEL WITH GFR
ALT: 10 U/L (ref 3–35)
AST: 19 U/L (ref 5–37)
Albumin: 3.9 g/dL (ref 3.5–5.2)
Alkaline Phosphatase: 83 U/L (ref 39–117)
BUN: 11 mg/dL (ref 6–23)
CO2: 29 meq/L (ref 19–32)
Calcium: 9.5 mg/dL (ref 8.4–10.5)
Chloride: 105 meq/L (ref 96–112)
Creatinine, Ser: 0.89 mg/dL (ref 0.40–1.20)
GFR: 62.21 mL/min
Glucose, Bld: 96 mg/dL (ref 70–99)
Potassium: 4.5 meq/L (ref 3.5–5.1)
Sodium: 139 meq/L (ref 135–145)
Total Bilirubin: 0.5 mg/dL (ref 0.2–1.2)
Total Protein: 6.2 g/dL (ref 6.0–8.3)

## 2024-08-14 LAB — LIPID PANEL
Cholesterol: 210 mg/dL — ABNORMAL HIGH (ref 28–200)
HDL: 68.3 mg/dL
LDL Cholesterol: 127 mg/dL — ABNORMAL HIGH (ref 10–99)
NonHDL: 141.25
Total CHOL/HDL Ratio: 3
Triglycerides: 71 mg/dL (ref 10.0–149.0)
VLDL: 14.2 mg/dL (ref 0.0–40.0)

## 2024-08-14 LAB — VITAMIN B12: Vitamin B-12: 285 pg/mL (ref 211–911)

## 2024-08-14 LAB — TSH: TSH: 1.39 u[IU]/mL (ref 0.35–5.50)

## 2024-08-14 LAB — VITAMIN D 25 HYDROXY (VIT D DEFICIENCY, FRACTURES): VITD: 37.91 ng/mL (ref 30.00–100.00)

## 2024-08-14 LAB — HEMOGLOBIN A1C: Hgb A1c MFr Bld: 5.8 % (ref 4.6–6.5)

## 2024-08-15 ENCOUNTER — Ambulatory Visit: Payer: Medicare Other

## 2024-08-16 ENCOUNTER — Ambulatory Visit

## 2024-08-16 VITALS — Ht 61.0 in | Wt 138.0 lb

## 2024-08-16 DIAGNOSIS — Z1231 Encounter for screening mammogram for malignant neoplasm of breast: Secondary | ICD-10-CM

## 2024-08-16 DIAGNOSIS — Z0000 Encounter for general adult medical examination without abnormal findings: Secondary | ICD-10-CM

## 2024-08-16 NOTE — Patient Instructions (Addendum)
 [name],  Thank you for taking the time for your Medicare Wellness Visit. I appreciate your continued commitment to your health goals. Please review the care plan we discussed, and feel free to reach out if I can assist you further.  Please note that Annual Wellness Visits do not include a physical exam. Some assessments may be limited, especially if the visit was conducted virtually. If needed, we may recommend an in-person follow-up with your provider.  Ongoing Care Seeing your primary care provider every 3 to 6 months helps us  monitor your health and provide consistent, personalized care.   Referrals If a referral was made during today's visit and you haven't received any updates within two weeks, please contact the referred provider directly to check on the status.  Recommended Screenings:  Health Maintenance  Topic Date Due   Colon Cancer Screening  10/06/2022   Breast Cancer Screening  10/20/2023   COVID-19 Vaccine (2 - Pfizer risk series) 09/01/2024*   Flu Shot  10/15/2024*   Zoster (Shingles) Vaccine (1 of 2) 11/14/2024*   Medicare Annual Wellness Visit  08/16/2025   DTaP/Tdap/Td vaccine (3 - Tdap) 02/11/2029   Pneumococcal Vaccine for age over 36  Completed   Osteoporosis screening with Bone Density Scan  Completed   Hepatitis C Screening  Completed   Meningitis B Vaccine  Aged Out  *Topic was postponed. The date shown is not the original due date.       08/16/2024    8:51 AM  Advanced Directives  Does Patient Have a Medical Advance Directive? Yes  Type of Estate Agent of [place];Living will  Does patient want to make changes to medical advance directive? Yes (Inpatient - patient requests chaplain consult to change a medical advance directive)  Copy of Healthcare Power of Attorney in Chart? No - copy requested    Vision: Annual vision screenings are recommended for early detection of glaucoma, cataracts, and diabetic retinopathy. These exams  can also reveal signs of chronic conditions such as diabetes and high blood pressure.  Dental: Annual dental screenings help detect early signs of oral cancer, gum disease, and other conditions linked to overall health, including heart disease and diabetes.

## 2024-08-19 ENCOUNTER — Encounter: Payer: Medicare Other | Admitting: Family Medicine

## 2024-08-29 ENCOUNTER — Ambulatory Visit

## 2024-09-10 ENCOUNTER — Encounter: Admitting: Family Medicine

## 2024-11-04 ENCOUNTER — Ambulatory Visit: Admitting: Internal Medicine

## 2025-08-18 ENCOUNTER — Ambulatory Visit

## 2025-08-20 ENCOUNTER — Ambulatory Visit

## 2025-08-20 ENCOUNTER — Encounter: Admitting: Family Medicine
